# Patient Record
Sex: Male | Born: 1955 | Hispanic: Yes | Marital: Married | Smoking: Never smoker
Health system: Southern US, Community
[De-identification: ages and names within clinical notes are randomized; demographics above are authoritative.]

## PROBLEM LIST (undated history)

## (undated) DIAGNOSIS — I5042 Chronic combined systolic (congestive) and diastolic (congestive) heart failure: Secondary | ICD-10-CM

## (undated) DIAGNOSIS — Z7901 Long term (current) use of anticoagulants: Secondary | ICD-10-CM

## (undated) DIAGNOSIS — I255 Ischemic cardiomyopathy: Secondary | ICD-10-CM

## (undated) DIAGNOSIS — E785 Hyperlipidemia, unspecified: Secondary | ICD-10-CM

## (undated) DIAGNOSIS — I2510 Atherosclerotic heart disease of native coronary artery without angina pectoris: Secondary | ICD-10-CM

## (undated) DIAGNOSIS — I10 Essential (primary) hypertension: Secondary | ICD-10-CM

## (undated) DIAGNOSIS — E119 Type 2 diabetes mellitus without complications: Secondary | ICD-10-CM

## (undated) DIAGNOSIS — I2102 ST elevation (STEMI) myocardial infarction involving left anterior descending coronary artery: Secondary | ICD-10-CM

## (undated) HISTORY — DX: Chronic combined systolic (congestive) and diastolic (congestive) heart failure: I50.42

## (undated) HISTORY — DX: ST elevation (STEMI) myocardial infarction involving left anterior descending coronary artery: I21.02

## (undated) HISTORY — PX: APPENDECTOMY: SHX54

## (undated) HISTORY — DX: Atherosclerotic heart disease of native coronary artery without angina pectoris: I25.10

## (undated) HISTORY — PX: CARDIAC CATHETERIZATION: SHX172

## (undated) HISTORY — DX: Essential (primary) hypertension: I10

## (undated) HISTORY — DX: Type 2 diabetes mellitus without complications: E11.9

## (undated) HISTORY — DX: Ischemic cardiomyopathy: I25.5

## (undated) HISTORY — DX: Hyperlipidemia, unspecified: E78.5

## (undated) HISTORY — PX: CORONARY ANGIOPLASTY: SHX604

---

## 2017-01-25 ENCOUNTER — Emergency Department: Payer: Self-pay

## 2017-01-25 ENCOUNTER — Encounter: Payer: Self-pay | Admitting: Emergency Medicine

## 2017-01-25 ENCOUNTER — Inpatient Hospital Stay
Admission: EM | Admit: 2017-01-25 | Discharge: 2017-01-28 | DRG: 246 | Disposition: A | Discharge status: Home / Self Care | Payer: Self-pay | Attending: Internal Medicine | Admitting: Internal Medicine

## 2017-01-25 ENCOUNTER — Encounter
Admission: EM | Disposition: A | Discharge status: Home / Self Care | Payer: Self-pay | Source: Home / Self Care | Attending: Internal Medicine

## 2017-01-25 ENCOUNTER — Other Ambulatory Visit: Payer: Self-pay

## 2017-01-25 DIAGNOSIS — Z825 Family history of asthma and other chronic lower respiratory diseases: Secondary | ICD-10-CM

## 2017-01-25 DIAGNOSIS — I255 Ischemic cardiomyopathy: Secondary | ICD-10-CM | POA: Diagnosis present

## 2017-01-25 DIAGNOSIS — E1165 Type 2 diabetes mellitus with hyperglycemia: Secondary | ICD-10-CM | POA: Diagnosis present

## 2017-01-25 DIAGNOSIS — I5021 Acute systolic (congestive) heart failure: Secondary | ICD-10-CM | POA: Diagnosis present

## 2017-01-25 DIAGNOSIS — I2109 ST elevation (STEMI) myocardial infarction involving other coronary artery of anterior wall: Secondary | ICD-10-CM | POA: Diagnosis present

## 2017-01-25 DIAGNOSIS — I213 ST elevation (STEMI) myocardial infarction of unspecified site: Secondary | ICD-10-CM | POA: Diagnosis present

## 2017-01-25 DIAGNOSIS — Z8249 Family history of ischemic heart disease and other diseases of the circulatory system: Secondary | ICD-10-CM

## 2017-01-25 DIAGNOSIS — I2510 Atherosclerotic heart disease of native coronary artery without angina pectoris: Secondary | ICD-10-CM | POA: Diagnosis present

## 2017-01-25 DIAGNOSIS — I2102 ST elevation (STEMI) myocardial infarction involving left anterior descending coronary artery: Principal | ICD-10-CM | POA: Diagnosis present

## 2017-01-25 DIAGNOSIS — I110 Hypertensive heart disease with heart failure: Secondary | ICD-10-CM | POA: Diagnosis present

## 2017-01-25 DIAGNOSIS — E785 Hyperlipidemia, unspecified: Secondary | ICD-10-CM | POA: Diagnosis present

## 2017-01-25 DIAGNOSIS — E876 Hypokalemia: Secondary | ICD-10-CM | POA: Diagnosis not present

## 2017-01-25 HISTORY — PX: LEFT HEART CATH AND CORONARY ANGIOGRAPHY: CATH118249

## 2017-01-25 HISTORY — PX: CORONARY/GRAFT ACUTE MI REVASCULARIZATION: CATH118305

## 2017-01-25 LAB — COMPREHENSIVE METABOLIC PANEL
ALBUMIN: 4.3 g/dL (ref 3.5–5.0)
ALT: 49 U/L (ref 17–63)
AST: 230 U/L — AB (ref 15–41)
Alkaline Phosphatase: 63 U/L (ref 38–126)
Anion gap: 10 (ref 5–15)
BUN: 13 mg/dL (ref 6–20)
CHLORIDE: 104 mmol/L (ref 101–111)
CO2: 24 mmol/L (ref 22–32)
Calcium: 9.3 mg/dL (ref 8.9–10.3)
Creatinine, Ser: 0.77 mg/dL (ref 0.61–1.24)
GFR calc Af Amer: >60 mL/min (ref >60)
GFR calc non Af Amer: >60 mL/min (ref >60)
GLUCOSE: 196 mg/dL — AB (ref 65–99)
POTASSIUM: 3.6 mmol/L (ref 3.5–5.1)
SODIUM: 138 mmol/L (ref 135–145)
Total Bilirubin: 0.7 mg/dL (ref 0.3–1.2)
Total Protein: 7.4 g/dL (ref 6.5–8.1)

## 2017-01-25 LAB — CBC WITH DIFFERENTIAL/PLATELET
BASOS ABS: 0 10*3/uL (ref 0–0.1)
BASOS PCT: 0 %
Eosinophils Absolute: 0.1 10*3/uL (ref 0–0.7)
Eosinophils Relative: 1 %
HEMATOCRIT: 46.6 % (ref 40.0–52.0)
HEMOGLOBIN: 16.2 g/dL (ref 13.0–18.0)
LYMPHS PCT: 15 %
Lymphs Abs: 1.7 10*3/uL (ref 1.0–3.6)
MCH: 31.3 pg (ref 26.0–34.0)
MCHC: 34.6 g/dL (ref 32.0–36.0)
MCV: 90.4 fL (ref 80.0–100.0)
MONOS PCT: 8 %
Monocytes Absolute: 0.8 10*3/uL (ref 0.2–1.0)
NEUTROS ABS: 8.3 10*3/uL — AB (ref 1.4–6.5)
NEUTROS PCT: 76 %
Platelets: 215 10*3/uL (ref 150–440)
RBC: 5.16 MIL/uL (ref 4.40–5.90)
RDW: 13.1 % (ref 11.5–14.5)
WBC: 10.9 10*3/uL — ABNORMAL HIGH (ref 3.8–10.6)

## 2017-01-25 LAB — URINE DRUG SCREEN, QUALITATIVE (ARMC ONLY)
Amphetamines, Ur Screen: NOT DETECTED
BARBITURATES, UR SCREEN: NOT DETECTED
BENZODIAZEPINE, UR SCRN: POSITIVE — AB
Cannabinoid 50 Ng, Ur ~~LOC~~: NOT DETECTED
Cocaine Metabolite,Ur ~~LOC~~: NOT DETECTED
MDMA (Ecstasy)Ur Screen: NOT DETECTED
Methadone Scn, Ur: NOT DETECTED
OPIATE, UR SCREEN: NOT DETECTED
PHENCYCLIDINE (PCP) UR S: NOT DETECTED
Tricyclic, Ur Screen: NOT DETECTED

## 2017-01-25 LAB — TYPE AND SCREEN
ABO/RH(D): O POS
Antibody Screen: NEGATIVE

## 2017-01-25 LAB — GLUCOSE, CAPILLARY
GLUCOSE-CAPILLARY: 111 mg/dL — AB (ref 65–99)
Glucose-Capillary: 164 mg/dL — ABNORMAL HIGH (ref 65–99)

## 2017-01-25 LAB — TROPONIN I: TROPONIN I: 37.34 ng/mL — AB (ref <0.03)

## 2017-01-25 LAB — LIPID PANEL
CHOL/HDL RATIO: 4.2 ratio
Cholesterol: 184 mg/dL (ref 0–200)
HDL: 44 mg/dL (ref >40)
LDL CALC: 114 mg/dL — AB (ref 0–99)
Triglycerides: 132 mg/dL (ref <150)
VLDL: 26 mg/dL (ref 0–40)

## 2017-01-25 LAB — APTT: APTT: 33 s (ref 24–36)

## 2017-01-25 LAB — TSH: TSH: 2.06 u[IU]/mL (ref 0.350–4.500)

## 2017-01-25 LAB — PROTIME-INR
INR: 1.05
Prothrombin Time: 13.6 seconds (ref 11.4–15.2)

## 2017-01-25 LAB — POCT ACTIVATED CLOTTING TIME
ACTIVATED CLOTTING TIME: 241 s
ACTIVATED CLOTTING TIME: 290 s

## 2017-01-25 LAB — MRSA PCR SCREENING: MRSA BY PCR: NEGATIVE

## 2017-01-25 SURGERY — CORONARY/GRAFT ACUTE MI REVASCULARIZATION

## 2017-01-25 MED ORDER — SODIUM CHLORIDE 0.9 % IV SOLN: 250.0000 mL | INTRAVENOUS | Status: DC | PRN

## 2017-01-25 MED ORDER — FUROSEMIDE 10 MG/ML IJ SOLN
20.0000 mg | Freq: Once | INTRAMUSCULAR | Status: AC
  Administered 2017-01-25: 20 mg via INTRAVENOUS
  Filled 2017-01-25: qty 2

## 2017-01-25 MED ORDER — VERAPAMIL HCL 2.5 MG/ML IV SOLN
INTRAVENOUS | Status: AC
  Filled 2017-01-25: qty 2

## 2017-01-25 MED ORDER — HEPARIN SODIUM (PORCINE) 5000 UNIT/ML IJ SOLN
60.0000 [IU]/kg | Freq: Once | INTRAMUSCULAR | Status: DC
  Administered 2017-01-25: 4080 [IU] via INTRAVENOUS

## 2017-01-25 MED ORDER — NITROGLYCERIN 1 MG/10 ML FOR IR/CATH LAB
INTRA_ARTERIAL | Status: DC | PRN
  Administered 2017-01-25 (×2): 100 ug via INTRACORONARY

## 2017-01-25 MED ORDER — OXYCODONE HCL 5 MG PO TABS: 5.0000 mg | ORAL_TABLET | ORAL | Status: DC | PRN

## 2017-01-25 MED ORDER — ONDANSETRON HCL 4 MG/2ML IJ SOLN: 4.0000 mg | Freq: Four times a day (QID) | INTRAMUSCULAR | Status: DC | PRN

## 2017-01-25 MED ORDER — ASPIRIN 81 MG PO CHEW
324.0000 mg | CHEWABLE_TABLET | Freq: Once | ORAL | Status: AC
  Administered 2017-01-25: 324 mg via ORAL

## 2017-01-25 MED ORDER — POTASSIUM CHLORIDE CRYS ER 20 MEQ PO TBCR
40.0000 meq | EXTENDED_RELEASE_TABLET | Freq: Once | ORAL | Status: AC
  Administered 2017-01-25: 40 meq via ORAL
  Filled 2017-01-25: qty 2

## 2017-01-25 MED ORDER — IOPAMIDOL (ISOVUE-370) INJECTION 76%
100.0000 mL | Freq: Once | INTRAVENOUS | Status: AC | PRN
  Administered 2017-01-25: 100 mL via INTRAVENOUS

## 2017-01-25 MED ORDER — NITROGLYCERIN 5 MG/ML IV SOLN
INTRAVENOUS | Status: AC
  Filled 2017-01-25: qty 10

## 2017-01-25 MED ORDER — TIROFIBAN HCL IN NACL 5-0.9 MG/100ML-% IV SOLN: INTRAVENOUS | Status: DC | PRN

## 2017-01-25 MED ORDER — SODIUM CHLORIDE 0.9% FLUSH
3.0000 mL | Freq: Two times a day (BID) | INTRAVENOUS | Status: DC
  Administered 2017-01-25 – 2017-01-28 (×6): 3 mL via INTRAVENOUS

## 2017-01-25 MED ORDER — ATORVASTATIN CALCIUM 20 MG PO TABS
80.0000 mg | ORAL_TABLET | Freq: Every day | ORAL | Status: DC
  Administered 2017-01-25 – 2017-01-27 (×3): 80 mg via ORAL
  Filled 2017-01-25 (×3): qty 4

## 2017-01-25 MED ORDER — LIDOCAINE HCL (PF) 1 % IJ SOLN
INTRAMUSCULAR | Status: AC
  Filled 2017-01-25: qty 30

## 2017-01-25 MED ORDER — ACETAMINOPHEN 325 MG PO TABS
650.0000 mg | ORAL_TABLET | ORAL | Status: DC | PRN
  Administered 2017-01-25: 650 mg via ORAL
  Filled 2017-01-25: qty 2

## 2017-01-25 MED ORDER — INSULIN ASPART 100 UNIT/ML ~~LOC~~ SOLN
0.0000 [IU] | Freq: Three times a day (TID) | SUBCUTANEOUS | Status: DC
  Administered 2017-01-26: 2 [IU] via SUBCUTANEOUS
  Administered 2017-01-26 – 2017-01-27 (×2): 1 [IU] via SUBCUTANEOUS
  Administered 2017-01-27: 3 [IU] via SUBCUTANEOUS
  Filled 2017-01-25 (×4): qty 1

## 2017-01-25 MED ORDER — INSULIN ASPART 100 UNIT/ML ~~LOC~~ SOLN: 0.0000 [IU] | Freq: Every day | SUBCUTANEOUS | Status: DC

## 2017-01-25 MED ORDER — TIROFIBAN HCL IV 12.5 MG/250 ML
INTRAVENOUS | Status: DC | PRN
  Administered 2017-01-25: 0.15 ug/kg/min via INTRAVENOUS

## 2017-01-25 MED ORDER — HEPARIN SODIUM (PORCINE) 1000 UNIT/ML IJ SOLN
INTRAMUSCULAR | Status: AC
  Filled 2017-01-25: qty 1

## 2017-01-25 MED ORDER — IOPAMIDOL (ISOVUE-300) INJECTION 61%
INTRAVENOUS | Status: DC | PRN
  Administered 2017-01-25: 175 mL via INTRA_ARTERIAL

## 2017-01-25 MED ORDER — CARVEDILOL 3.125 MG PO TABS
3.1250 mg | ORAL_TABLET | Freq: Two times a day (BID) | ORAL | Status: DC
  Administered 2017-01-25 – 2017-01-28 (×5): 3.125 mg via ORAL
  Filled 2017-01-25 (×6): qty 1

## 2017-01-25 MED ORDER — TICAGRELOR 90 MG PO TABS
ORAL_TABLET | ORAL | Status: DC | PRN
  Administered 2017-01-25: 180 mg via ORAL

## 2017-01-25 MED ORDER — SODIUM CHLORIDE 0.9% FLUSH
3.0000 mL | INTRAVENOUS | Status: DC | PRN
  Administered 2017-01-26: 3 mL via INTRAVENOUS
  Filled 2017-01-25: qty 3

## 2017-01-25 MED ORDER — TIROFIBAN (AGGRASTAT) BOLUS VIA INFUSION
INTRAVENOUS | Status: DC | PRN
  Administered 2017-01-25: 1700 ug via INTRAVENOUS

## 2017-01-25 MED ORDER — HEPARIN (PORCINE) IN NACL 2-0.9 UNIT/ML-% IJ SOLN
INTRAMUSCULAR | Status: AC
  Filled 2017-01-25: qty 1000

## 2017-01-25 MED ORDER — HYDRALAZINE HCL 20 MG/ML IJ SOLN: 5.0000 mg | INTRAMUSCULAR | Status: DC | PRN

## 2017-01-25 MED ORDER — MIDAZOLAM HCL 2 MG/2ML IJ SOLN
INTRAMUSCULAR | Status: AC
  Filled 2017-01-25: qty 2

## 2017-01-25 MED ORDER — FENTANYL CITRATE (PF) 100 MCG/2ML IJ SOLN
INTRAMUSCULAR | Status: AC
  Filled 2017-01-25: qty 2

## 2017-01-25 MED ORDER — TICAGRELOR 90 MG PO TABS
ORAL_TABLET | ORAL | Status: AC
  Filled 2017-01-25: qty 2

## 2017-01-25 MED ORDER — HEPARIN (PORCINE) IN NACL 100-0.45 UNIT/ML-% IJ SOLN
800.0000 [IU]/h | INTRAMUSCULAR | Status: DC
  Administered 2017-01-25: 800 [IU]/h via INTRAVENOUS
  Filled 2017-01-25: qty 250

## 2017-01-25 MED ORDER — FENTANYL CITRATE (PF) 100 MCG/2ML IJ SOLN
INTRAMUSCULAR | Status: DC | PRN
  Administered 2017-01-25 (×3): 25 ug via INTRAVENOUS

## 2017-01-25 MED ORDER — TICAGRELOR 90 MG PO TABS
90.0000 mg | ORAL_TABLET | Freq: Two times a day (BID) | ORAL | Status: DC
  Administered 2017-01-26 – 2017-01-28 (×5): 90 mg via ORAL
  Filled 2017-01-25 (×6): qty 1

## 2017-01-25 MED ORDER — ASPIRIN 81 MG PO CHEW
81.0000 mg | CHEWABLE_TABLET | Freq: Every day | ORAL | Status: DC
  Administered 2017-01-26 – 2017-01-28 (×3): 81 mg via ORAL
  Filled 2017-01-25 (×3): qty 1

## 2017-01-25 MED ORDER — HEPARIN BOLUS VIA INFUSION
4000.0000 [IU] | Freq: Once | INTRAVENOUS | Status: DC
  Filled 2017-01-25: qty 4000

## 2017-01-25 MED ORDER — ENOXAPARIN SODIUM 40 MG/0.4ML ~~LOC~~ SOLN
40.0000 mg | SUBCUTANEOUS | Status: DC
  Administered 2017-01-26 – 2017-01-28 (×3): 40 mg via SUBCUTANEOUS
  Filled 2017-01-25 (×4): qty 0.4

## 2017-01-25 MED ORDER — MIDAZOLAM HCL 2 MG/2ML IJ SOLN
INTRAMUSCULAR | Status: DC | PRN
  Administered 2017-01-25 (×2): 0.5 mg via INTRAVENOUS

## 2017-01-25 MED ORDER — VERAPAMIL HCL 2.5 MG/ML IV SOLN
INTRAVENOUS | Status: DC | PRN
  Administered 2017-01-25: 2.5 mg via INTRA_ARTERIAL

## 2017-01-25 MED ORDER — HEPARIN SODIUM (PORCINE) 1000 UNIT/ML IJ SOLN
INTRAMUSCULAR | Status: DC | PRN
  Administered 2017-01-25: 4000 [IU] via INTRAVENOUS
  Administered 2017-01-25: 2000 [IU] via INTRAVENOUS

## 2017-01-25 MED ORDER — SODIUM CHLORIDE 0.9 % IV SOLN: INTRAVENOUS | Status: DC

## 2017-01-25 SURGICAL SUPPLY — 16 items
BALLN TREK RX 2.25X12 (BALLOONS) ×3
BALLN ~~LOC~~ EUPHORA RX 3.5X20 (BALLOONS) ×3
BALLOON TREK RX 2.25X12 (BALLOONS) ×1 IMPLANT
BALLOON ~~LOC~~ EUPHORA RX 3.5X20 (BALLOONS) ×1 IMPLANT
CATH 5FR JR4 DIAGNOSTIC (CATHETERS) ×3 IMPLANT
CATH INFINITI 5FR AL1 (CATHETERS) ×3 IMPLANT
CATH LAUNCHER 6FR EBU3.5 (CATHETERS) ×3 IMPLANT
DEVICE INFLAT 30 PLUS (MISCELLANEOUS) ×3 IMPLANT
DEVICE RAD COMP TR BAND LRG (VASCULAR PRODUCTS) ×3 IMPLANT
GLIDESHEATH SLEND SS 6F .021 (SHEATH) ×3 IMPLANT
KIT MANI 3VAL PERCEP (MISCELLANEOUS) ×3 IMPLANT
PACK CARDIAC CATH (CUSTOM PROCEDURE TRAY) ×3 IMPLANT
STENT SIERRA 3.00 X 23 MM (Permanent Stent) ×3 IMPLANT
WIRE HITORQ VERSACORE ST 145CM (WIRE) ×3 IMPLANT
WIRE ROSEN-J .035X260CM (WIRE) ×3 IMPLANT
WIRE RUNTHROUGH .014X180CM (WIRE) ×3 IMPLANT

## 2017-01-25 NOTE — Progress Notes (Signed)
ANTICOAGULATION CONSULT NOTE - Initial Consult  Pharmacy Consult for Heparin  Indication: chest pain/ACS  Allergies no known allergies  Patient Measurements: Height: 5\' 1"  (154.9 cm) Weight: 150 lb (68 kg) IBW/kg (Calculated) : 52.3 Heparin Dosing Weight: 66.2 kg   Vital Signs: Temp: 98.4 F (36.9 C) (11/25 1725) Temp Source: Oral (11/25 1725) BP: 146/108 (11/25 1725) Pulse Rate: 92 (11/25 1725)  Labs: No results for input(s): HGB, HCT, PLT, APTT, LABPROT, INR, HEPARINUNFRC, HEPRLOWMOCWT, CREATININE, CKTOTAL, CKMB, TROPONINI in the last 72 hours.  CrCl cannot be calculated (No order found.).   Medical History: History reviewed. No pertinent past medical history.  Medications:   (Not in a hospital admission)  Assessment: Pharmacy consulted to dose heparin for ACS/NSTEMI in this 50 male.  No prior anticoag noted.  CrCl = ?   Goal of Therapy:  Heparin level 0.3-0.7 units/ml Monitor platelets by anticoagulation protocol: Yes   Plan:  Give 4000 units bolus x 1 Start heparin infusion at 800 units/hr Check anti-Xa level in 6 hours and daily while on heparin Continue to monitor H&H and platelets  Lucas [name] D 01/25/2017,5:30 PM

## 2017-01-25 NOTE — Progress Notes (Signed)
Chaplain provided Spiritual Care during STEMI Code. Chaplain provided family (wife, son - speaks English well) support during code. Prayer offered with interpretors support (Catholic background) to [employer] (Spanish first language). Chaplain supported family during education, decision making, and transition to waiting area. Family shared Lucas [name] has had health decline (recent left job due to health concerns). Chaplain worked with case Freight forwarder to provide family with materials related to financial aid and community resources.

## 2017-01-25 NOTE — ED Provider Notes (Addendum)
[facility] Emergency Department Provider Note       Time seen: ----------------------------------------- 5:29 PM on 01/25/2017 -----------------------------------------   I have reviewed the triage vital signs and the nursing notes.  HISTORY   Chief Complaint Chest Pain    HPI Lucas [name] is a 61 y.o. male with a known past medical history who presents to the ED for chest pain intermittently for the last month but worsening and severe with persistence over the last 4 days.  Patient states last week he had pain from his chest into his back and currently his worst pain is in his back.  He describes 10 out of 10 pain, nothing makes it better.  He is dyspneic on exertion and had diaphoresis.  History reviewed. No pertinent past medical history.  There are no active problems to display for this patient.   History reviewed. No pertinent surgical history.  Allergies Patient has no known allergies.  Social History Social History   Tobacco Use  . Smoking status: Not on file  Substance Use Topics  . Alcohol use: No    Frequency: Never  . Drug use: No    Review of Systems Constitutional: Negative for fever. Eyes: Negative for vision changes ENT:  Negative for congestion, sore throat Cardiovascular: Positive for chest pain Respiratory: Positive for shortness of breath Gastrointestinal: Negative for abdominal pain, vomiting and diarrhea. Musculoskeletal: Positive for back pain Skin: Positive for diaphoresis Neurological: Negative for headaches, focal weakness or numbness.  All systems negative/normal/unremarkable except as stated in the HPI  ____________________________________________   PHYSICAL EXAM:  VITAL SIGNS: ED Triage Vitals  Enc Vitals Group     BP 01/25/17 1725 (!) 146/108     Pulse Rate 01/25/17 1725 92     Resp 01/25/17 1725 20     Temp 01/25/17 1725 98.4 F (36.9 C)     Temp Source 01/25/17 1725 Oral     SpO2  01/25/17 1725 96 %     Weight 01/25/17 1725 150 lb (68 kg)     Height 01/25/17 1725 5\' 1"  (1.549 m)     Head Circumference --      Peak Flow --      Pain Score 01/25/17 1724 10     Pain Loc --      Pain Edu? --      Excl. in [place]? --     Constitutional: Alert and oriented. Mild distress Eyes: Conjunctivae are normal. Normal extraocular movements. ENT   Head: Normocephalic and atraumatic.   Nose: No congestion/rhinnorhea.   Mouth/Throat: Mucous membranes are moist.   Neck: No stridor. Cardiovascular: Normal rate, regular rhythm. No murmurs, rubs, or gallops. Respiratory: Normal respiratory effort without tachypnea nor retractions. Breath sounds are clear and equal bilaterally. No wheezes/rales/rhonchi. Gastrointestinal: Soft and nontender. Normal bowel sounds Musculoskeletal: Nontender with normal range of motion in extremities. No lower extremity tenderness nor edema. Neurologic:  Normal speech and language. No gross focal neurologic deficits are appreciated.  Skin:  Skin is warm,  Mild diaphoresis is noted Psychiatric: Mood and affect are normal. Speech and behavior are normal.  ____________________________________________  EKG: Interpreted by me.  Sinus rhythm with sinus arrhythmia, rate is 81 bpm, ST elevation in the anteroseptal leads, T wave inversions in 1 and aVL.  Left axis deviation.  EKG meets STEMI criteria  ____________________________________________  ED COURSE:  Pertinent labs & imaging results that were available during my care of the patient were reviewed by me and considered in my  medical decision making (see chart for details). Patient presents for chest pain, we will assess with labs and imaging as indicated.   Procedures ____________________________________________   LABS (pertinent positives/negatives)  Labs Reviewed  CBC WITH DIFFERENTIAL/PLATELET - Abnormal; Notable for the following components:      Result Value   WBC 10.9 (*)    Neutro  Abs 8.3 (*)    All other components within normal limits  PROTIME-INR  APTT  COMPREHENSIVE METABOLIC PANEL  TROPONIN I  LIPID PANEL  HEPARIN LEVEL (UNFRACTIONATED)  TYPE AND SCREEN   CRITICAL CARE Performed by: Earleen Newport   Total critical care time: 30 minutes  Critical care time was exclusive of separately billable procedures and treating other patients.  Critical care was necessary to treat or prevent imminent or life-threatening deterioration.  Critical care was time spent personally by me on the following activities: development of treatment plan with patient and/or surrogate as well as nursing, discussions with consultants, evaluation of patient's response to treatment, examination of patient, obtaining history from patient or surrogate, ordering and performing treatments and interventions, ordering and review of laboratory studies, ordering and review of radiographic studies, pulse oximetry and re-evaluation of patient's condition.  RADIOLOGY Images were viewed by me  CT dissection protocol of the chest/abdomen and pelvis Is negative for dissection ____________________________________________  DIFFERENTIAL DIAGNOSIS   Dissection, STEMI, unstable angina, musculoskeletal pain  FINAL ASSESSMENT AND PLAN  STEMI   Plan: Patient had presented for chest pain that radiates into his back that is been persistent for the last 4 days. Patient's labs are still pending at this time. Patient's imaging was reassuring for not showing a dissection.  Patient needs emergent cardiac catheterization for likely stenting of the coronary artery involved.  Dr. Saunders Revel from cardiology has been involved with his care.   Earleen Newport, MD   Note: This note was generated in part or whole with voice recognition software. Voice recognition is usually quite accurate but there are transcription errors that can and very often do occur. I apologize for any typographical errors that were  not detected and corrected.     Earleen Newport, MD 01/25/17 1754    Earleen Newport, MD 01/25/17 [phone]

## 2017-01-25 NOTE — ED Notes (Signed)
Pt taken to cath lab at this time .

## 2017-01-25 NOTE — ED Notes (Signed)
Cardiologist at bedside.  

## 2017-01-25 NOTE — ED Triage Notes (Signed)
Pt reports that he has had chest pain for a month,  the last week he has had pain from his back that radiates into his chest.

## 2017-01-25 NOTE — Consult Note (Signed)
Cardiology Consultation Note    Patient ID: Lucas [name], MRN: [id number], DOB/AGE: [birth year] 61 y.o. Admit date: 01/25/2017   Date of Consult: 01/25/2017 Primary Physician: No primary care provider on file. Primary Cardiologist: New  Chief Complaint: Chest pain Reason for Consultation: STEMI Requesting MD: Lenise Arena, MD  HPI: Lucas [name] is a 61 y.o. male with no significant past medical history, who presents with chest pain that began 3 days ago. History is obtained with the assistance of a Spanish interpreter. He noticed severe substernal and right shoulder pain, that has been continuous. He also notes intermittent nausea and diaphoresis. He did not tell anyone about the pain until yesterday evening. While out shopping with his family today, he was unable to get out of the car due to severe chest pain. He presented to the [facility] ED, were EKG showed anterior ST elevation with anteroseptal Q-waves. The patient received ASA 324 mg and heparin 4,080 units IV x 1. CTA of the chest, abdomen, and pelvis was performed out of concern for aortic dissection, given severe left shoulder pain predominantly around the scapula. Preliminary review of the CTA showed no evidence of dissection.  At this time, he continues to have 9/10 chest and right shoulder/back pain.  History reviewed. No pertinent past medical history.    Surgical History: History reviewed. No pertinent surgical history.   Home Meds: Prior to Admission medications   Not on File    Inpatient Medications:  . [MAR Hold] heparin  4,000 Units Intravenous Once   . sodium chloride    . heparin 800 Units/hr (01/25/17 1808)    Allergies: No Known Allergies  Social History   Socioeconomic History  . Marital status: Married    Spouse name: Not on file  . Number of children: Not on file  . Years of education: Not on file  . Highest education level: Not on file  Social Needs  . Financial resource strain: Not  on file  . Food insecurity - worry: Not on file  . Food insecurity - inability: Not on file  . Transportation needs - medical: Not on file  . Transportation needs - non-medical: Not on file  Occupational History  . Not on file  Tobacco Use  . Smoking status: Never Smoker  . Smokeless tobacco: Never Used  Substance and Sexual Activity  . Alcohol use: No    Frequency: Never  . Drug use: No  . Sexual activity: Yes    Birth control/protection: Coitus interruptus  Other Topics Concern  . Not on file  Social History Narrative  . Not on file     Family History  Problem Relation Age of Onset  . Heart disease Father        s/p pacemaker     Review of Systems: Unable to be performed due to acuity of illness.  Labs: Recent Labs    01/25/17 1730  TROPONINI 37.34*   Lab Results  Component Value Date   WBC 10.9 (H) 01/25/2017   HGB 16.2 01/25/2017   HCT 46.6 01/25/2017   MCV 90.4 01/25/2017   PLT 215 01/25/2017    Recent Labs  Lab 01/25/17 1730  NA 138  K 3.6  CL 104  CO2 24  BUN 13  CREATININE 0.77  CALCIUM 9.3  PROT 7.4  BILITOT 0.7  ALKPHOS 63  ALT 49  AST 230*  GLUCOSE 196*   Lab Results  Component Value Date   CHOL 184 01/25/2017   HDL 44  01/25/2017   LDLCALC 114 (H) 01/25/2017   TRIG 132 01/25/2017   No results found for: DDIMER  Radiology/Studies:  Dg Chest Port 1 View  Result Date: 01/25/2017 CLINICAL DATA:  Patient with chest pain for 1 month. EXAM: PORTABLE CHEST 1 VIEW COMPARISON:  None. FINDINGS: Pacing pad overlies the left hemithorax. Normal cardiac and mediastinal contours. No consolidative pulmonary opacities. No pleural effusion or pneumothorax. IMPRESSION: No acute cardiopulmonary process. Electronically Signed   By: Lovey Newcomer M.D.   On: 01/25/2017 18:14    Wt Readings from Last 3 Encounters:  01/25/17 150 lb (68 kg)    EKG: NSR with PACs, anterior ST elevation and anteroseptal Q-waves.  Physical Exam: Blood pressure (!)  139/94, pulse 90, temperature 98.4 F (36.9 C), temperature source Oral, resp. rate 18, height 5\' 1"  (1.549 m), weight 150 lb (68 kg), SpO2 97 %. Body mass index is 28.34 kg/m. General: Well developed, well nourished, in no acute distress. Head: Normocephalic, atraumatic, sclera non-icteric, no xanthomas, nares are without discharge.  Neck: Negative for carotid bruits. JVD not elevated. Lungs: Clear bilaterally to auscultation without wheezes, rales, or rhonchi. Breathing is unlabored. Heart: RRR with S1 S2. No murmurs, rubs, or gallops appreciated. Abdomen: Soft, non-tender, non-distended with normoactive bowel sounds. No hepatomegaly. No rebound/guarding. No obvious abdominal masses. Msk:  Strength and tone appear normal for age. Extremities: No clubbing or cyanosis. No edema.  Distal pedal pulses are 2+ and equal bilaterally. Neuro: Alert and oriented X 3. No facial asymmetry. No focal deficit. Moves all extremities spontaneously. Psych:  Responds to questions appropriately with a normal affect.    Assessment and Plan  Anterior STEMI EKG findings are consistent with late-presenting anterior STEMI. I suspect his initial event began when symptoms developed 3 days ago. Initial troponin is already significant elevated. However, given continued severe chest pain, I am concerned that some potentially viable myocardium remains ischemic and may benefit from revascularization. I have reviewed the risks, indications, and alternatives to cardiac catheterization, possible angioplasty, and stenting with the patient. Risks include but are not limited to bleeding, infection, vascular injury, stroke, myocardial infection, arrhythmia, kidney injury, radiation-related injury in the case of prolonged fluoroscopy use, emergency cardiac surgery, and death. The patient understands the risks of serious complication is 1-2 in 2637 with diagnostic cardiac cath and 1-2% or less with angioplasty/stenting. The patient was  initially hesitant to proceed with the procedure. A lengthy discussion ensued with [name] and his family; he ultimately agreed to proceed with emergent LHC and possible PCI.  Dispo: Transfer to ICU following catheterization. Hospitalist service to admit; we will continue to follow.  Verlan Friends Odis Wickey MD 01/25/2017, 6:31 PM Pager: [phone]

## 2017-01-25 NOTE — Progress Notes (Signed)
[place] Progress Note Patient Name: Lucas [name] DOB: [birth year] MRN: [id number]   Date of Service  01/25/2017  HPI/Events of Note  Acute coronary syndrome- DES to LAD CT angio neg  eICU Interventions  monitor     Intervention Category Evaluation Type: New Patient Evaluation  Lucas [name] V. Lawana Hartzell 01/25/2017, 9:15 PM

## 2017-01-25 NOTE — ED Notes (Signed)
Family and interpreter at bedside with cardiologist discussing cardiac catheterization.

## 2017-01-25 NOTE — H&P (Signed)
[place] [facility] [place] NAME: Lucas [name]    MR#:  [id number]  DATE OF BIRTH:  [birth year]  DATE OF ADMISSION:  01/25/2017  PRIMARY CARE PHYSICIAN: No primary care provider on file.   REQUESTING/REFERRING PHYSICIAN: Dr Harrell Gave End  CHIEF COMPLAINT:   Chief Complaint  Patient presents with  . Chest Pain    HISTORY OF PRESENT ILLNESS:  Lucas [name]  is a 61 y.o. male no medical problems presented with chest pain.  History obtained through interpreter.  He states today he was working today and he was lifting up boxes stacking them up over his head and he developed pain in his arms.  He went to eat.  His entire back and chest was hurting.  Then he developed some hurting in his head.  It felt like he was going to fall and he was very weak.  Pain described as 10 out of 10 in intensity.  Difficult to ascertain the quality of the pain through the interpreter.  No complaints of shortness of breath, nausea or diaphoresis.  The patient states that he had the pain all day yesterday and he thought it would go away.  The patient was brought to the cardiac Cath Lab for a STEMI and had a stent in the LAD.  He states his back pain is a little less than 8 out of 10 in intensity and his chest pain is down to 4 out of 10 intensity.  Hospitalist services were contacted for admission.  PAST MEDICAL HISTORY:  History reviewed. No pertinent past medical history.  PAST SURGICAL HISTORY:   Past Surgical History:  Procedure Laterality Date  . APPENDECTOMY      SOCIAL HISTORY:   Social History   Tobacco Use  . Smoking status: Never Smoker  . Smokeless tobacco: Never Used  Substance Use Topics  . Alcohol use: No    Frequency: Never    FAMILY HISTORY:   Family History  Problem Relation Age of Onset  . Heart disease Father        s/p pacemaker  . Arrhythmia Father   . Asthma Father   . Asthma Mother     DRUG ALLERGIES:  No  Known Allergies  REVIEW OF SYSTEMS:  CONSTITUTIONAL: No fever, chills or sweats.  Positive for fatigue.  EYES: No blurred or double vision.  EARS, NOSE, AND THROAT: No tinnitus or ear pain.  Positive for sore throat. RESPIRATORY: Occasional cough.  No shortness of breath, wheezing or hemoptysis.  CARDIOVASCULAR: Positive for chest pain, no orthopnea, edema.  GASTROINTESTINAL: No nausea, vomiting, diarrhea or abdominal pain. No blood in bowel movements GENITOURINARY: No dysuria, hematuria.  ENDOCRINE: No polyuria, nocturia,  HEMATOLOGY: No anemia, easy bruising or bleeding SKIN: No rash or lesion. MUSCULOSKELETAL: Positive for back pain.  Some muscle pain NEUROLOGIC: No tingling, numbness, weakness.  PSYCHIATRY: No anxiety or depression.   MEDICATIONS AT HOME:   Prior to Admission medications   Not on File   Patient does not take any medication  VITAL SIGNS:  Blood pressure (!) 139/94, pulse 90, temperature 98 F (36.7 C), temperature source Oral, resp. rate 18, height 5\' 8"  (1.727 m), weight 66.6 kg (146 lb 13.2 oz), SpO2 97 %.  PHYSICAL EXAMINATION:  GENERAL:  61 y.o.-year-old patient lying in the bed with no acute distress.  EYES: Pupils equal, round, reactive to light and accommodation. No scleral icterus. Extraocular muscles intact.  HEENT: Head atraumatic, normocephalic. Oropharynx and nasopharynx clear.  NECK:  Supple, no jugular venous distention. No thyroid enlargement, no tenderness.  LUNGS: Normal breath sounds bilaterally, no wheezing, rales,rhonchi or crepitation. No use of accessory muscles of respiration.  CARDIOVASCULAR: S1, S2 normal. No murmurs, rubs, or gallops.  ABDOMEN: Soft, nontender, nondistended. Bowel sounds present. No organomegaly or mass.  EXTREMITIES: No pedal edema, cyanosis, or clubbing.  NEUROLOGIC: Cranial nerves II through XII are intact. Muscle strength 5/5 in all extremities. Sensation intact. Gait not checked.  PSYCHIATRIC: The patient is  alert and oriented x 3.  SKIN: No rash, lesion, or ulcer.   LABORATORY PANEL:   CBC Recent Labs  Lab 01/25/17 1730  WBC 10.9*  HGB 16.2  HCT 46.6  PLT 215   ------------------------------------------------------------------------------------------------------------------  Chemistries  Recent Labs  Lab 01/25/17 1730  NA 138  K 3.6  CL 104  CO2 24  GLUCOSE 196*  BUN 13  CREATININE 0.77  CALCIUM 9.3  AST 230*  ALT 49  ALKPHOS 63  BILITOT 0.7   ------------------------------------------------------------------------------------------------------------------  Cardiac Enzymes Recent Labs  Lab 01/25/17 1730  TROPONINI 37.34*   ------------------------------------------------------------------------------------------------------------------  RADIOLOGY:  Dg Chest Port 1 View  Result Date: 01/25/2017 CLINICAL DATA:  Patient with chest pain for 1 month. EXAM: PORTABLE CHEST 1 VIEW COMPARISON:  None. FINDINGS: Pacing pad overlies the left hemithorax. Normal cardiac and mediastinal contours. No consolidative pulmonary opacities. No pleural effusion or pneumothorax. IMPRESSION: No acute cardiopulmonary process. Electronically Signed   By: Lovey Newcomer M.D.   On: 01/25/2017 18:14   Ct Angio Chest/abd/pel For Dissection W And/or Wo Contrast  Result Date: 01/25/2017 CLINICAL DATA:  Chest pain for 1 month EXAM: CT ANGIOGRAPHY CHEST, ABDOMEN AND PELVIS TECHNIQUE: Multidetector CT imaging through the chest, abdomen and pelvis was performed using the standard protocol during bolus administration of intravenous contrast. Multiplanar reconstructed images and MIPs were obtained and reviewed to evaluate the vascular anatomy. CONTRAST:  124mL ISOVUE-370 IOPAMIDOL (ISOVUE-370) INJECTION 76% COMPARISON:  01/25/2017 FINDINGS: CTA CHEST FINDINGS Cardiovascular: Non contrasted images of the chest demonstrate no intramural hematoma. nonaneurysmal aorta. No dissection is seen. Mild coronary artery  calcification. Minimal atherosclerotic calcifications of the aorta. Normal heart size. Trace pericardial fluid. Mediastinum/Nodes: Multiple calcified lymph nodes consistent with prior granulomatous disease. Midline trachea. No thyroid mass. Esophagus within normal limits. Lungs/Pleura: Calcified right upper lobe pulmonary nodule, likely a granuloma given calcified nodes. No pleural effusion or consolidation. Negative for a pneumothorax Musculoskeletal: No chest wall abnormality. No acute or significant osseous findings. Review of the MIP images confirms the above findings. CTA ABDOMEN AND PELVIS FINDINGS VASCULAR Aorta: Normal caliber aorta without aneurysm, dissection, vasculitis or significant stenosis. Celiac: Patent without evidence of aneurysm, dissection, vasculitis or significant stenosis. SMA: Patent without evidence of aneurysm, dissection, vasculitis or significant stenosis. Renals: Both renal arteries are patent without evidence of aneurysm, dissection, vasculitis, fibromuscular dysplasia or significant stenosis. IMA: Patent without evidence of aneurysm, dissection, vasculitis or significant stenosis. Inflow: Patent without evidence of aneurysm, dissection, vasculitis or significant stenosis. Veins: No obvious venous abnormality within the limitations of this arterial phase study. Review of the MIP images confirms the above findings. NON-VASCULAR Hepatobiliary: No focal liver abnormality is seen. No gallstones, gallbladder wall thickening, or biliary dilatation. Pancreas: Unremarkable. No pancreatic ductal dilatation or surrounding inflammatory changes. Spleen: Heterogeneous, likely due to arterial phase enhancement Adrenals/Urinary Tract: Adrenal glands are unremarkable. Kidneys are normal, without renal calculi, focal lesion, or hydronephrosis. Mild asymmetric thickening of the right anterior bladder wall. Stomach/Bowel: Stomach is within normal limits.  Appendix not well seen. Diffuse diverticular  disease of the colon without acute inflammation No evidence of bowel wall thickening, distention, or inflammatory changes. Lymphatic: No significantly enlarged lymph nodes Reproductive: Prostate gland is enlarged. Other: Negative for free air or free fluid. Right greater than left fat containing inguinal hernia. Musculoskeletal: No acute or significant osseous findings. Review of the MIP images confirms the above findings. IMPRESSION: 1. Negative for acute aortic dissection or aneurysm 2. Clear lung fields.  Evidence of prior granulomatous disease 3. Minimal atherosclerotic calcification and coronary calcification. 4. No CT evidence for acute intra-abdominal or pelvic abnormality 5. Diffuse diverticular disease of the colon without acute inflammation 6. Mild asymmetric wall thickening of the right anterior bladder, correlate clinically for any urinary tract symptoms. Electronically Signed   By: Donavan Foil M.D.   On: 01/25/2017 18:45    EKG:   Acute STEMI with ST elevations V2 through V5.  Left axis deviation, left atrial enlargement  IMPRESSION AND PLAN:   1.  STEMI.  Stents placed in the LAD by cardiology Dr. Saunders Revel.  Aspirin, Brilinta, Coreg and Lipitor prescribed.  Troponin elevated at 37.34 2.  Hyperlipidemia unspecified.  LDL 114 and goal less than 70.  Lipitor prescribed. 3.  Impaired fasting glucose.  Check a hemoglobin A1c and put on sliding scale for now. 4.  Essential hypertension.  Coreg prescribed.  All the records are reviewed and case discussed with ED provider. Management plans discussed with the patient, family and they are in agreement.  CODE STATUS: Full code  TOTAL TIME TAKING CARE OF THIS PATIENT: 50 minutes.    Loletha Grayer M.D on 01/25/2017 at 9:38 PM  Between 7am to 6pm - Pager - [phone]  After 6pm call admission pager [phone]  [facility] Office  [phone]  CC: Primary care physician; No primary care provider on file.

## 2017-01-26 ENCOUNTER — Encounter: Payer: Self-pay | Admitting: Internal Medicine

## 2017-01-26 ENCOUNTER — Inpatient Hospital Stay (HOSPITAL_COMMUNITY)
Admit: 2017-01-26 | Discharge: 2017-01-26 | Disposition: A | Discharge status: Home / Self Care | Payer: Self-pay | Attending: Internal Medicine | Admitting: Internal Medicine

## 2017-01-26 ENCOUNTER — Other Ambulatory Visit: Payer: Self-pay

## 2017-01-26 DIAGNOSIS — I5030 Unspecified diastolic (congestive) heart failure: Secondary | ICD-10-CM

## 2017-01-26 LAB — TROPONIN I: Troponin I: 46.55 ng/mL (ref <0.03)

## 2017-01-26 LAB — BASIC METABOLIC PANEL
ANION GAP: 9 (ref 5–15)
BUN: 13 mg/dL (ref 6–20)
CALCIUM: 8.8 mg/dL — AB (ref 8.9–10.3)
CO2: 23 mmol/L (ref 22–32)
CREATININE: 0.78 mg/dL (ref 0.61–1.24)
Chloride: 107 mmol/L (ref 101–111)
GFR calc non Af Amer: >60 mL/min (ref >60)
Glucose, Bld: 149 mg/dL — ABNORMAL HIGH (ref 65–99)
POTASSIUM: 3.7 mmol/L (ref 3.5–5.1)
SODIUM: 139 mmol/L (ref 135–145)

## 2017-01-26 LAB — GLUCOSE, CAPILLARY
GLUCOSE-CAPILLARY: 126 mg/dL — AB (ref 65–99)
GLUCOSE-CAPILLARY: 191 mg/dL — AB (ref 65–99)
Glucose-Capillary: 110 mg/dL — ABNORMAL HIGH (ref 65–99)
Glucose-Capillary: 153 mg/dL — ABNORMAL HIGH (ref 65–99)

## 2017-01-26 LAB — CBC
HCT: 44.4 % (ref 40.0–52.0)
HEMOGLOBIN: 15.3 g/dL (ref 13.0–18.0)
MCH: 30.8 pg (ref 26.0–34.0)
MCHC: 34.4 g/dL (ref 32.0–36.0)
MCV: 89.3 fL (ref 80.0–100.0)
Platelets: 214 10*3/uL (ref 150–440)
RBC: 4.98 MIL/uL (ref 4.40–5.90)
RDW: 13 % (ref 11.5–14.5)
WBC: 12.3 10*3/uL — ABNORMAL HIGH (ref 3.8–10.6)

## 2017-01-26 LAB — ECHOCARDIOGRAM COMPLETE
HEIGHTINCHES: 68 in
WEIGHTICAEL: 2349.22 [oz_av]

## 2017-01-26 NOTE — Progress Notes (Signed)
Good day. Knowledge increases in diet and disease process. Paperwork for reduced prescription  prices given to patient. Interpreter in and out to asisst with language barrier. Denies pain. Tolerating diet.

## 2017-01-26 NOTE — Progress Notes (Signed)
1825 Report given to [facility] on 2A. [phone] Transferred patient via bed to 233.

## 2017-01-26 NOTE — Care Management Note (Signed)
Case Management Note  Patient Details  Name: Lucas [name] MRN: [id number] Date of Birth: Apr 08, 1955  Subjective/Objective:                  Met with patient with the assistance of Spanish interpreter.  He does not have a PCP or health insurance. He lives with his wife that actually works here at [employer]. Patient is currently on Brilinta however I consulted with pharmacy and Plavix may be a better choice for long-term.  Brilinta coupon delivered to patient along with application (Spanish version) to [facility] and [facility].   Action/Plan:  Referral to [facility] with patient's permission.   Expected Discharge Date:  01/28/17               Expected Discharge Plan:     In-House Referral:     Discharge planning Services  CM Consult, Medication Assistance, [facility]  Post Acute Care Choice:    Choice offered to:  (S) Patient(Spanish interpreter )  DME Arranged:    DME Agency:     HH Arranged:    [place] Agency:     Status of Service:  In process, will continue to follow  If discussed at Long Length of Stay Meetings, dates discussed:    Additional Comments:  Marshell Garfinkel, RN 01/26/2017, 10:47 AM

## 2017-01-26 NOTE — Progress Notes (Signed)
[place] at [place] NAME: Lucas [name]    MR#:  [id number]  DATE OF BIRTH:  [birth year]  SUBJECTIVE:  CHIEF COMPLAINT:   Chief Complaint  Patient presents with  . Chest Pain     Came with chest pain- have STEMI- LAD stent placed. No complains today.  REVIEW OF SYSTEMS:  CONSTITUTIONAL: No fever, fatigue or weakness.  EYES: No blurred or double vision.  EARS, NOSE, AND THROAT: No tinnitus or ear pain.  RESPIRATORY: No cough, shortness of breath, wheezing or hemoptysis.  CARDIOVASCULAR: No chest pain, orthopnea, edema.  GASTROINTESTINAL: No nausea, vomiting, diarrhea or abdominal pain.  GENITOURINARY: No dysuria, hematuria.  ENDOCRINE: No polyuria, nocturia,  HEMATOLOGY: No anemia, easy bruising or bleeding SKIN: No rash or lesion. MUSCULOSKELETAL: No joint pain or arthritis.   NEUROLOGIC: No tingling, numbness, weakness.  PSYCHIATRY: No anxiety or depression.   ROS  DRUG ALLERGIES:  No Known Allergies  VITALS:  Blood pressure 113/81, pulse 73, temperature 99 F (37.2 C), resp. rate 19, height 5\' 8"  (1.727 m), weight 66.6 kg (146 lb 13.2 oz), SpO2 96 %.  PHYSICAL EXAMINATION:  GENERAL:  61 y.o.-year-old patient lying in the bed with no acute distress.  EYES: Pupils equal, round, reactive to light and accommodation. No scleral icterus. Extraocular muscles intact.  HEENT: Head atraumatic, normocephalic. Oropharynx and nasopharynx clear.  NECK:  Supple, no jugular venous distention. No thyroid enlargement, no tenderness.  LUNGS: Normal breath sounds bilaterally, no wheezing, rales,rhonchi or crepitation. No use of accessory muscles of respiration.  CARDIOVASCULAR: S1, S2 normal. No murmurs, rubs, or gallops.  ABDOMEN: Soft, nontender, nondistended. Bowel sounds present. No organomegaly or mass.  EXTREMITIES: No pedal edema, cyanosis, or clubbing.  NEUROLOGIC: Cranial nerves II through XII are intact. Muscle strength 5/5 in all  extremities. Sensation intact. Gait not checked.  PSYCHIATRIC: The patient is alert and oriented x 3.  SKIN: No obvious rash, lesion, or ulcer.   Physical Exam LABORATORY PANEL:   CBC Recent Labs  Lab 01/26/17 0255  WBC 12.3*  HGB 15.3  HCT 44.4  PLT 214   ------------------------------------------------------------------------------------------------------------------  Chemistries  Recent Labs  Lab 01/25/17 1730 01/26/17 0255  NA 138 139  K 3.6 3.7  CL 104 107  CO2 24 23  GLUCOSE 196* 149*  BUN 13 13  CREATININE 0.77 0.78  CALCIUM 9.3 8.8*  AST 230*  --   ALT 49  --   ALKPHOS 63  --   BILITOT 0.7  --    ------------------------------------------------------------------------------------------------------------------  Cardiac Enzymes Recent Labs  Lab 01/26/17 0255 01/26/17 0853  TROPONINI >65.00* 46.55*   ------------------------------------------------------------------------------------------------------------------  RADIOLOGY:  Dg Chest Port 1 View  Result Date: 01/25/2017 CLINICAL DATA:  Patient with chest pain for 1 month. EXAM: PORTABLE CHEST 1 VIEW COMPARISON:  None. FINDINGS: Pacing pad overlies the left hemithorax. Normal cardiac and mediastinal contours. No consolidative pulmonary opacities. No pleural effusion or pneumothorax. IMPRESSION: No acute cardiopulmonary process. Electronically Signed   By: Lovey Newcomer M.D.   On: 01/25/2017 18:14   Ct Angio Chest/abd/pel For Dissection W And/or Wo Contrast  Result Date: 01/25/2017 CLINICAL DATA:  Chest pain for 1 month EXAM: CT ANGIOGRAPHY CHEST, ABDOMEN AND PELVIS TECHNIQUE: Multidetector CT imaging through the chest, abdomen and pelvis was performed using the standard protocol during bolus administration of intravenous contrast. Multiplanar reconstructed images and MIPs were obtained and reviewed to evaluate the vascular anatomy. CONTRAST:  129mL ISOVUE-370 IOPAMIDOL (ISOVUE-370) INJECTION 76%  COMPARISON:   01/25/2017 FINDINGS: CTA CHEST FINDINGS Cardiovascular: Non contrasted images of the chest demonstrate no intramural hematoma. nonaneurysmal aorta. No dissection is seen. Mild coronary artery calcification. Minimal atherosclerotic calcifications of the aorta. Normal heart size. Trace pericardial fluid. Mediastinum/Nodes: Multiple calcified lymph nodes consistent with prior granulomatous disease. Midline trachea. No thyroid mass. Esophagus within normal limits. Lungs/Pleura: Calcified right upper lobe pulmonary nodule, likely a granuloma given calcified nodes. No pleural effusion or consolidation. Negative for a pneumothorax Musculoskeletal: No chest wall abnormality. No acute or significant osseous findings. Review of the MIP images confirms the above findings. CTA ABDOMEN AND PELVIS FINDINGS VASCULAR Aorta: Normal caliber aorta without aneurysm, dissection, vasculitis or significant stenosis. Celiac: Patent without evidence of aneurysm, dissection, vasculitis or significant stenosis. SMA: Patent without evidence of aneurysm, dissection, vasculitis or significant stenosis. Renals: Both renal arteries are patent without evidence of aneurysm, dissection, vasculitis, fibromuscular dysplasia or significant stenosis. IMA: Patent without evidence of aneurysm, dissection, vasculitis or significant stenosis. Inflow: Patent without evidence of aneurysm, dissection, vasculitis or significant stenosis. Veins: No obvious venous abnormality within the limitations of this arterial phase study. Review of the MIP images confirms the above findings. NON-VASCULAR Hepatobiliary: No focal liver abnormality is seen. No gallstones, gallbladder wall thickening, or biliary dilatation. Pancreas: Unremarkable. No pancreatic ductal dilatation or surrounding inflammatory changes. Spleen: Heterogeneous, likely due to arterial phase enhancement Adrenals/Urinary Tract: Adrenal glands are unremarkable. Kidneys are normal, without renal calculi,  focal lesion, or hydronephrosis. Mild asymmetric thickening of the right anterior bladder wall. Stomach/Bowel: Stomach is within normal limits. Appendix not well seen. Diffuse diverticular disease of the colon without acute inflammation No evidence of bowel wall thickening, distention, or inflammatory changes. Lymphatic: No significantly enlarged lymph nodes Reproductive: Prostate gland is enlarged. Other: Negative for free air or free fluid. Right greater than left fat containing inguinal hernia. Musculoskeletal: No acute or significant osseous findings. Review of the MIP images confirms the above findings. IMPRESSION: 1. Negative for acute aortic dissection or aneurysm 2. Clear lung fields.  Evidence of prior granulomatous disease 3. Minimal atherosclerotic calcification and coronary calcification. 4. No CT evidence for acute intra-abdominal or pelvic abnormality 5. Diffuse diverticular disease of the colon without acute inflammation 6. Mild asymmetric wall thickening of the right anterior bladder, correlate clinically for any urinary tract symptoms. Electronically Signed   By: Donavan Foil M.D.   On: 01/25/2017 18:45    ASSESSMENT AND PLAN:   Principal Problem:   STEMI (ST elevation myocardial infarction) ([place]) Active Problems:   STEMI involving left anterior descending coronary artery ([place])   1. Anterior ST elevation MI: -Late presenting anterior STEMI with acute plaque rupture and 100% thrombotic occlusion of the proximal LAD s/p PCI/DES  -Currently, chest pain free -Troponin remains > 65, continue to cycle until down trending -ASA and Brilinta for at least 12 months -Post  Cath H/H and renal function stable -Cardiac rehab  2. Acute systolic CHF/ICM: -He does not appear grossly volume up -TTE pending -Soft BP precludes titration of Coreg or addition of ACEi/ARB/spironolactone at this time. Start when able -If EF is < 35% by TTE, recommend LifeVest prior to discharge   3.  HTN: -Stable -Slightly soft BP precludes titration of Coreg  4. HLD: -LDL 114, goal < 70 -Lipitor  5. Hyperglycemia: -A1c pending  6. Leukocytosis: -No obvious signs of infection -Likely inflammatory  -Monitor   7. Dispo: -Can likely transfer to 2A today -Ambulate and likely d./c tomorrow.  Due to No insurance,  he will need assistance regarding his meds. Case manager helping with that.   All the records are reviewed and case discussed with Care Management/Social Workerr. Management plans discussed with the patient, family and they are in agreement.  CODE STATUS: Full.  TOTAL TIME TAKING CARE OF THIS PATIENT: 35 minutes.    POSSIBLE D/C IN 1-2 DAYS, DEPENDING ON CLINICAL CONDITION.   Vaughan Basta M.D on 01/26/2017   Between 7am to 6pm - Pager - [phone]  After 6pm go to [email or web address] - password [email or web address] [facility]  Office  [phone]  CC: Primary care physician; No primary care provider on file.  Note: This dictation was prepared with Dragon dictation along with smaller phrase technology. Any transcriptional errors that result from this process are unintentional.

## 2017-01-26 NOTE — Plan of Care (Signed)
Nutrition Education Note  RD consulted for nutrition education regarding a Heart Healthy diet.   Lipid Panel     Component Value Date/Time   CHOL 184 01/25/2017 1730   TRIG 132 01/25/2017 1730   HDL 44 01/25/2017 1730   CHOLHDL 4.2 01/25/2017 1730   VLDL 26 01/25/2017 1730   LDLCALC 114 (H) 01/25/2017 1730   Met with patient, his wife, and his son at bedside. Provided education with assistance from Spanish interpreter Environmental education officer). Patient reports he has received some nutrition education before from a nutrition club/class he was in. He typically eats 3 meals per day and meals are usually prepared by his wife (who works here at [employer]). Breakfast is usually cereal or chicken soup/broth. Lunch is rice and beans or tortillas. Dinner is usually rice and beans or tortillas and a fruit (banana or apple). He reports they also purchase spinach and lettuce for salads every week at the grocery store (did not specify whether he actually eats them or not). He reports they typically prepare fresh foods and do not cook with a lot of salt. He reports his appetite is good. He has been weight stable at around 145 lbs for a while (used to weigh 175 lbs 8 years ago before losing weight intentionally).  RD provided "Heart Healthy Nutrition Therapy" handout from the Academy of [employer]etics. Reviewed patient's dietary recall. Provided examples on ways to decrease sodium and fat intake in diet. Discouraged intake of processed foods and use of salt shaker. Encouraged fresh fruits and vegetables as well as whole grain sources of carbohydrates to maximize fiber intake. Teach back method used.  Expect good compliance.  Body mass index is 22.32 kg/m. Pt meets criteria for normal weight based on current BMI.  Current diet order is 2 gram sodium. He reports he is eating well though no meal percentage documented in chart at this time. Labs and medications reviewed. No further nutrition interventions warranted  at this time. RD contact information provided. If additional nutrition issues arise, please re-consult RD.  Willey Blade, MS, [place], LDN Office: [phone] Pager: [phone] After Hours/Weekend Pager: [phone]

## 2017-01-26 NOTE — Progress Notes (Signed)
Progress Note  Patient Name: Lucas [name] Date of Encounter: 01/26/2017  Primary Cardiologist: New to [facility] - consult by End  Subjective   No chest pain or SOB.   Inpatient Medications    Scheduled Meds: . aspirin  81 mg Oral Daily  . atorvastatin  80 mg Oral q1800  . carvedilol  3.125 mg Oral BID WC  . enoxaparin (LOVENOX) injection  40 mg Subcutaneous Q24H  . insulin aspart  0-5 Units Subcutaneous QHS  . insulin aspart  0-9 Units Subcutaneous TID WC  . sodium chloride flush  3 mL Intravenous Q12H  . ticagrelor  90 mg Oral BID   Continuous Infusions: . sodium chloride    . sodium chloride     PRN Meds: sodium chloride, acetaminophen, ondansetron (ZOFRAN) IV, oxyCODONE, sodium chloride flush   Vital Signs    Vitals:   01/26/17 0000 01/26/17 0400 01/26/17 0500 01/26/17 0600  BP: 114/80 95/62 98/67  106/71  Pulse: 86 73 84   Resp: 18 18 14 16   Temp: 98.8 F (37.1 C) 99.1 F (37.3 C)    TempSrc: Oral Oral    SpO2: 94% 95% 94%   Weight:      Height:        Intake/Output Summary (Last 24 hours) at 01/26/2017 0733 Last data filed at 01/26/2017 0600 Gross per 24 hour  Intake 150 ml  Output 960 ml  Net -810 ml   Filed Weights   01/25/17 1725 01/25/17 2000  Weight: 150 lb (68 kg) 146 lb 13.2 oz (66.6 kg)    Telemetry    NSR - Personally Reviewed  ECG    NSR, 77 bpm, anteroseptal infarct  - Personally Reviewed  Physical Exam   GEN: No acute distress.   Neck: No JVD. Cardiac: RRR, no murmurs, rubs, or gallops. Cardiac cath site without bleeding, bruising, swelling, erythema, or TTP. Radial pulse 2+. Respiratory: Clear to auscultation bilaterally.  GI: Soft, nontender, non-distended.   MS: No edema; No deformity. Neuro:  Alert and oriented x 3; Nonfocal.  Psych: Normal affect.  Labs    Chemistry Recent Labs  Lab 01/25/17 1730 01/26/17 0255  NA 138 139  K 3.6 3.7  CL 104 107  CO2 24 23  GLUCOSE 196* 149*  BUN 13 13  CREATININE  0.77 0.78  CALCIUM 9.3 8.8*  PROT 7.4  --   ALBUMIN 4.3  --   AST 230*  --   ALT 49  --   ALKPHOS 63  --   BILITOT 0.7  --   GFRNONAA >60 >60  GFRAA >60 >60  ANIONGAP 10 9     Hematology Recent Labs  Lab 01/25/17 1730 01/26/17 0255  WBC 10.9* 12.3*  RBC 5.16 4.98  HGB 16.2 15.3  HCT 46.6 44.4  MCV 90.4 89.3  MCH 31.3 30.8  MCHC 34.6 34.4  RDW 13.1 13.0  PLT 215 214    Cardiac Enzymes Recent Labs  Lab 01/25/17 1730 01/25/17 2316 01/26/17 0255  TROPONINI 37.34* >65.00* >65.00*   No results for input(s): TROPIPOC in the last 168 hours.   BNPNo results for input(s): BNP, PROBNP in the last 168 hours.   DDimer No results for input(s): DDIMER in the last 168 hours.   Radiology    Dg Chest Port 1 View  Result Date: 01/25/2017 IMPRESSION: No acute cardiopulmonary process. Electronically Signed   By: Lovey Newcomer M.D.   On: 01/25/2017 18:14   Ct Angio Chest/abd/pel For Dissection W  And/or Wo Contrast  Result Date: 01/25/2017 IMPRESSION: 1. Negative for acute aortic dissection or aneurysm 2. Clear lung fields.  Evidence of prior granulomatous disease 3. Minimal atherosclerotic calcification and coronary calcification. 4. No CT evidence for acute intra-abdominal or pelvic abnormality 5. Diffuse diverticular disease of the colon without acute inflammation 6. Mild asymmetric wall thickening of the right anterior bladder, correlate clinically for any urinary tract symptoms. Electronically Signed   By: Donavan Foil M.D.   On: 01/25/2017 18:45    Cardiac Studies   LHC 01/25/2017: Coronary Findings   Diagnostic  Dominance: Right  Left Main  Vessel is large.  Mid LM to Dist LM lesion 20% stenosed  Mid LM to Dist LM lesion is 20% stenosed.  Left Anterior Descending  Vessel is large.  Ost LAD lesion 20% stenosed  Ost LAD lesion is 20% stenosed.  Prox LAD lesion 100% stenosed  Prox LAD lesion is 100% stenosed. Vessel is the culprit lesion. The lesion is type B2 and  thrombotic.  Mid LAD lesion 25% stenosed  Mid LAD lesion is 25% stenosed.  First Diagonal Branch  Vessel is moderate in size.  Ost 1st Diag lesion 50% stenosed  Ost 1st Diag lesion is 50% stenosed.  Second Diagonal Branch  Vessel is moderate in size.  Third Diagonal Branch  Vessel is small in size.  Left Circumflex  Vessel is moderate in size.  First Obtuse Marginal Branch  Vessel is moderate in size.  Second Obtuse Marginal Branch  Vessel is moderate in size.  Third Obtuse Marginal Branch  Vessel is small in size.  Right Coronary Artery  Vessel is large.  Prox RCA lesion 30% stenosed  Prox RCA lesion is 30% stenosed.  Right Posterior Descending Artery  Vessel is moderate in size.  Right Posterior Atrioventricular Branch  Vessel is moderate in size.  Intervention   Prox LAD lesion  Stent  Lesion length: 20 mm. Lesion crossed with guidewire using a WIRE RUNTHROUGH .[id number]. Pre-stent angioplasty was performed using a BALLOON TREK RX [id number]. A drug-eluting stent was successfully placed using a STENT SIERRA 3.00 X 23 MM. Maximum pressure: 16 atm. Stent strut is well apposed. Post-stent angioplasty was performed using a BALLOON [place] [id number]. Maximum pressure: 16 atm.  Post-Intervention Lesion Assessment  The intervention was successful. Pre-interventional TIMI flow is 0. Post-intervention TIMI flow is 3. Treated lesion length: 20 mm. No complications occurred at this lesion.  There is no residual stenosis post intervention.  Wall Motion              Left Heart   Left Ventricle The left ventricular size is normal. There is moderate to severe left ventricular systolic dysfunction. The left ventricular ejection fraction is 35-45% by visual estimate. There are LV function abnormalities due to segmental dysfunction.  Aortic Valve There is no aortic valve stenosis.  Coronary Diagrams   Diagnostic Diagram       Post-Intervention Diagram          Recommendations: 1. Admit to ICU; patient will need to remain hospitalized at least 48 hours, given large anterior MI. 2. Dual antiplatelet therapy with aspirin and ticagrelor for at least 12 months, ideally longer. 3. Aggressive secondary prevention, including high-intensity statin therapy. 4. Obtain transthoracic echocardiogram to better assess LVEF. If LVEF < 35%, I recommend LifeVest on discharge given large anterior MI. 5. Gentle diuresis, given elevated LVEDP. 6. Initiate carvedilol 3.125 mg BID with uptitration as tolerated. ACEI/ARB +/- aldosterone antagonist should be added  prior to discharge based on renal function.   TTE pending.    Patient Profile     61 y.o. male with no prior history who presented to [facility] on 11/25 with anterior STEMI.   Assessment & Plan    1. Anterior ST elevation MI: -Late presenting anterior STEMI with acute plaque rupture and 100% thrombotic occlusion of the proximal LAD s/p PCI/DES as detailed above -Currently, chest pain free -Troponin remains > 65, continue to cycle until down trending -ASA and Brilinta for at least 12 months -Post  Cath H/H and renal function stable -Cardiac rehab  2. Acute systolic CHF/ICM: -He does not appear grossly volume up -TTE pending -Soft BP precludes titration of Coreg or addition of ACEi/ARB/spironolactone at this time. Start when able -If EF is < 35% by TTE, recommend LifeVest prior to discharge   3. HTN: -Stable -Slightly soft BP precludes titration of Coreg  4. HLD: -LDL 114, goal < 70 -Lipitor  5. Hyperglycemia: -A1c pending  6. Leukocytosis: -No obvious signs of infection -Likely inflammatory  -Monitor   7. Dispo: -Can likely transfer to 2A today -Ambulate   For questions or updates, please contact [place] Please consult [email or web address] for contact info under Cardiology/STEMI.    Signed, Christell Faith, PA-C [place] Pager: [phone] 01/26/2017, 7:33 AM

## 2017-01-26 NOTE — Progress Notes (Signed)
No new complaints Denies chest pain No distress  Vitals:   01/26/17 1000 01/26/17 1100  BP: 105/67 107/82  Pulse: 86 73  Resp: 16 17  Temp:    SpO2: 95% 96%      Gen: NAD HEENT: NCAT, sclerae white, oropharynx normal Neck: No LAN, no JVD noted Lungs: full BS, normal percussion note, no adventitious sounds Cardiovascular: Reg, no M noted Abdomen: Soft, NT, +BS Ext: no C/C/E Neuro: grossly intact   BMP Latest Ref Rng & Units 01/26/2017 01/25/2017  Glucose 65 - 99 mg/dL 149(H) 196(H)  BUN 6 - 20 mg/dL 13 13  Creatinine 0.61 - 1.24 mg/dL 0.78 0.77  Sodium 135 - 145 mmol/L 139 138  Potassium 3.5 - 5.1 mmol/L 3.7 3.6  Chloride 101 - 111 mmol/L 107 104  CO2 22 - 32 mmol/L 23 24  Calcium 8.9 - 10.3 mg/dL 8.8(L) 9.3    CBC Latest Ref Rng & Units 01/26/2017 01/25/2017  WBC 3.8 - 10.6 K/uL 12.3(H) 10.9(H)  Hemoglobin 13.0 - 18.0 g/dL 15.3 16.2  Hematocrit 40.0 - 52.0 % 44.4 46.6  Platelets 150 - 440 K/uL 214 215    No new CXR  IMPRESSION: STEMI, S/P LHC with DES - uncomplicated course DM 2 - controlled  PLAN/REC: Transfer to [place] cath care per cardiology  After transfer, PCCM will sign off. Please call if we can be of further assistance    Merton Border, MD PCCM service Mobile [phone] Pager [phone] 01/26/2017 12:36 PM

## 2017-01-26 NOTE — Progress Notes (Signed)
    Patient without insurance. After his 30-day free trial of Brilinta it will likely cost about 200-250/month. Will keep patient on Brilinta 90 mg bid for the first 30 days with the free card. Following this, he will be transitioned to Plavix 75 mg daily as an outpatient.

## 2017-01-26 NOTE — Progress Notes (Signed)
*  PRELIMINARY RESULTS* Echocardiogram 2D Echocardiogram has been performed.  Sherrie Sport 01/26/2017, 3:31 PM

## 2017-01-26 NOTE — Plan of Care (Signed)
Patient rested well this shift. Interpreter used for communication.  Son at bedside until midnight.  No bleeding noted at LAD site. On call ENT notified of elevated Troponin.  E-link notified of EKG results.  No new orders.  All meds taken and tolerated well.  No adverse affects noted from procedure.  Will continue to monitor.

## 2017-01-27 DIAGNOSIS — E785 Hyperlipidemia, unspecified: Secondary | ICD-10-CM

## 2017-01-27 DIAGNOSIS — I255 Ischemic cardiomyopathy: Secondary | ICD-10-CM

## 2017-01-27 LAB — BASIC METABOLIC PANEL
ANION GAP: 11 (ref 5–15)
BUN: 14 mg/dL (ref 6–20)
CO2: 22 mmol/L (ref 22–32)
Calcium: 8.9 mg/dL (ref 8.9–10.3)
Chloride: 104 mmol/L (ref 101–111)
Creatinine, Ser: 0.83 mg/dL (ref 0.61–1.24)
Glucose, Bld: 243 mg/dL — ABNORMAL HIGH (ref 65–99)
POTASSIUM: 3.8 mmol/L (ref 3.5–5.1)
SODIUM: 137 mmol/L (ref 135–145)

## 2017-01-27 LAB — GLUCOSE, CAPILLARY
GLUCOSE-CAPILLARY: 112 mg/dL — AB (ref 65–99)
GLUCOSE-CAPILLARY: 211 mg/dL — AB (ref 65–99)
GLUCOSE-CAPILLARY: 138 mg/dL — AB (ref 65–99)
Glucose-Capillary: 135 mg/dL — ABNORMAL HIGH (ref 65–99)

## 2017-01-27 LAB — HEMOGLOBIN A1C
HEMOGLOBIN A1C: 6 % — AB (ref 4.8–5.6)
MEAN PLASMA GLUCOSE: 126 mg/dL

## 2017-01-27 MED ORDER — LISINOPRIL 5 MG PO TABS
2.5000 mg | ORAL_TABLET | Freq: Every day | ORAL | Status: DC
  Administered 2017-01-28: 2.5 mg via ORAL
  Filled 2017-01-27 (×2): qty 1

## 2017-01-27 MED ORDER — POTASSIUM CHLORIDE CRYS ER 20 MEQ PO TBCR
20.0000 meq | EXTENDED_RELEASE_TABLET | Freq: Once | ORAL | Status: AC
  Administered 2017-01-27: 20 meq via ORAL
  Filled 2017-01-27: qty 1

## 2017-01-27 MED ORDER — POTASSIUM CHLORIDE CRYS ER 10 MEQ PO TBCR
10.0000 meq | EXTENDED_RELEASE_TABLET | Freq: Every day | ORAL | Status: DC
  Administered 2017-01-27 – 2017-01-28 (×2): 10 meq via ORAL
  Filled 2017-01-27 (×3): qty 1

## 2017-01-27 MED ORDER — FUROSEMIDE 20 MG PO TABS
20.0000 mg | ORAL_TABLET | Freq: Every day | ORAL | Status: DC
  Administered 2017-01-27: 20 mg via ORAL
  Filled 2017-01-27 (×2): qty 1

## 2017-01-27 NOTE — Care Management Note (Addendum)
Case Management Note  Patient Details  Name: Lucas [name] MRN: [id number] Date of Birth: 1956/01/01  Subjective/Objective:                 Informed patient in need of [place] due to high potential for sudden cardiac death.  Through interpretor:  Patient does not meet the residency requirements for the [place].  He would not be eligible for medicaid or assistance through drug company ie- for [place] because recipients of assist must be [place] citizens.  Monthly income for family is 500 dollar every two weeks in a family with four dependents.  If [facility] agrees to pay lease for one month on the vest- [employer] will cover the next two months which would allow time to determine if needs internal defibrillator   Action/Plan:  Faxed application to [facility] and provided patient with list of additional items needed- picture ID, Proof of residence, proof of income.  Faxed [employer] referral.  Have sent email to [place] directors requesting approval for lease agreement and faxed the [place] for approval or denial.  Left voicemail message for [facility]  Expected Discharge Date:  01/28/17               Expected Discharge Plan:     In-House Referral:     Discharge planning Services  CM Consult, Medication Assistance, [facility]  Post Acute Care Choice:    Choice offered to:  (S) Patient(Spanish interpreter )  DME Arranged:    DME Agency:     HH Arranged:    [place] Agency:     Status of Service:  In process, will continue to follow  If discussed at Long Length of Stay Meetings, dates discussed:    Additional Comments:  Katrina Stack, RN 01/27/2017, 1:28 PM

## 2017-01-27 NOTE — Progress Notes (Signed)
Progress Note  Patient Name: Lucas [name] Date of Encounter: 01/27/2017  Primary Cardiologist: New to [facility] - consult by End   Subjective   No chest pain. Improving SOB. Medical interpreter used for encounter.   Inpatient Medications    Scheduled Meds: . aspirin  81 mg Oral Daily  . atorvastatin  80 mg Oral q1800  . carvedilol  3.125 mg Oral BID WC  . enoxaparin (LOVENOX) injection  40 mg Subcutaneous Q24H  . insulin aspart  0-5 Units Subcutaneous QHS  . insulin aspart  0-9 Units Subcutaneous TID WC  . sodium chloride flush  3 mL Intravenous Q12H  . ticagrelor  90 mg Oral BID   Continuous Infusions: . sodium chloride     PRN Meds: sodium chloride, acetaminophen, ondansetron (ZOFRAN) IV, sodium chloride flush   Vital Signs    Vitals:   01/26/17 1500 01/26/17 1901 01/27/17 0428 01/27/17 0727  BP: 113/81 119/70 112/69 127/80  Pulse:  (!) 102 84 84  Resp: 19 18  18   Temp:  98 F (36.7 C) 98.4 F (36.9 C) 98.1 F (36.7 C)  TempSrc:  Oral Oral Oral  SpO2:  95% 95% 98%  Weight:      Height:        Intake/Output Summary (Last 24 hours) at 01/27/2017 0805 Last data filed at 01/27/2017 0700 Gross per 24 hour  Intake 0 ml  Output 600 ml  Net -600 ml   Filed Weights   01/25/17 1725 01/25/17 2000  Weight: 150 lb (68 kg) 146 lb 13.2 oz (66.6 kg)    Telemetry    NSR - Personally Reviewed  ECG    n/a - Personally Reviewed  Physical Exam   GEN: No acute distress.   Neck: No JVD. Cardiac: RRR, no murmurs, rubs, or gallops. Cardiac cath site without bleeding, bruising, swelling, erythema, or TTP. Radial pulse 2+. Respiratory: Clear to auscultation bilaterally.  GI: Soft, nontender, non-distended.   MS: No edema; No deformity. Neuro:  Alert and oriented x 3; Nonfocal.  Psych: Normal affect.  Labs    Chemistry Recent Labs  Lab 01/25/17 1730 01/26/17 0255  NA 138 139  K 3.6 3.7  CL 104 107  CO2 24 23  GLUCOSE 196* 149*  BUN 13 13    CREATININE 0.77 0.78  CALCIUM 9.3 8.8*  PROT 7.4  --   ALBUMIN 4.3  --   AST 230*  --   ALT 49  --   ALKPHOS 63  --   BILITOT 0.7  --   GFRNONAA >60 >60  GFRAA >60 >60  ANIONGAP 10 9     Hematology Recent Labs  Lab 01/25/17 1730 01/26/17 0255  WBC 10.9* 12.3*  RBC 5.16 4.98  HGB 16.2 15.3  HCT 46.6 44.4  MCV 90.4 89.3  MCH 31.3 30.8  MCHC 34.6 34.4  RDW 13.1 13.0  PLT 215 214    Cardiac Enzymes Recent Labs  Lab 01/25/17 1730 01/25/17 2316 01/26/17 0255 01/26/17 0853  TROPONINI 37.34* >65.00* >65.00* 46.55*   No results for input(s): TROPIPOC in the last 168 hours.   BNPNo results for input(s): BNP, PROBNP in the last 168 hours.   DDimer No results for input(s): DDIMER in the last 168 hours.   Radiology    Dg Chest Port 1 View  Result Date: 01/25/2017 IMPRESSION: No acute cardiopulmonary process. Electronically Signed   By: Lovey Newcomer M.D.   On: 01/25/2017 18:14   Ct Angio Chest/abd/pel For  Dissection W And/or Wo Contrast  Result Date: 01/25/2017 IMPRESSION: 1. Negative for acute aortic dissection or aneurysm 2. Clear lung fields.  Evidence of prior granulomatous disease 3. Minimal atherosclerotic calcification and coronary calcification. 4. No CT evidence for acute intra-abdominal or pelvic abnormality 5. Diffuse diverticular disease of the colon without acute inflammation 6. Mild asymmetric wall thickening of the right anterior bladder, correlate clinically for any urinary tract symptoms. Electronically Signed   By: Donavan Foil M.D.   On: 01/25/2017 18:45    Cardiac Studies   LHC 01/25/2017: Coronary Findings   Diagnostic  Dominance: Right  Left Main  Vessel is large.  Mid LM to Dist LM lesion 20% stenosed  Mid LM to Dist LM lesion is 20% stenosed.  Left Anterior Descending  Vessel is large.  Ost LAD lesion 20% stenosed  Ost LAD lesion is 20% stenosed.  Prox LAD lesion 100% stenosed  Prox LAD lesion is 100% stenosed. Vessel is the culprit  lesion. The lesion is type B2 and thrombotic.  Mid LAD lesion 25% stenosed  Mid LAD lesion is 25% stenosed.  First Diagonal Branch  Vessel is moderate in size.  Ost 1st Diag lesion 50% stenosed  Ost 1st Diag lesion is 50% stenosed.  Second Diagonal Branch  Vessel is moderate in size.  Third Diagonal Branch  Vessel is small in size.  Left Circumflex  Vessel is moderate in size.  First Obtuse Marginal Branch  Vessel is moderate in size.  Second Obtuse Marginal Branch  Vessel is moderate in size.  Third Obtuse Marginal Branch  Vessel is small in size.  Right Coronary Artery  Vessel is large.  Prox RCA lesion 30% stenosed  Prox RCA lesion is 30% stenosed.  Right Posterior Descending Artery  Vessel is moderate in size.  Right Posterior Atrioventricular Branch  Vessel is moderate in size.  Intervention   Prox LAD lesion  Stent  Lesion length: 20 mm. Lesion crossed with guidewire using a WIRE RUNTHROUGH .[id number]. Pre-stent angioplasty was performed using a BALLOON TREK RX [id number]. A drug-eluting stent was successfully placed using a STENT SIERRA 3.00 X 23 MM. Maximum pressure: 16 atm. Stent strut is well apposed. Post-stent angioplasty was performed using a BALLOON [place] [id number]. Maximum pressure: 16 atm.  Post-Intervention Lesion Assessment  The intervention was successful. Pre-interventional TIMI flow is 0. Post-intervention TIMI flow is 3. Treated lesion length: 20 mm. No complications occurred at this lesion.  There is no residual stenosis post intervention.  Wall Motion               Left Heart   Left Ventricle The left ventricular size is normal. There is moderate to severe left ventricular systolic dysfunction. The left ventricular ejection fraction is 35-45% by visual estimate. There are LV function abnormalities due to segmental dysfunction.  Aortic Valve There is no aortic valve stenosis.  Coronary Diagrams   Diagnostic Diagram         Post-Intervention Diagram         Recommendations: 1. Admit to ICU; patient will need to remain hospitalized at least 48 hours, given large anterior MI. 2. Dual antiplatelet therapy with aspirin and ticagrelor for at least 12 months, ideally longer. 3. Aggressive secondary prevention, including high-intensity statin therapy. 4. Obtain transthoracic echocardiogram to better assess LVEF. If LVEF < 35%, I recommend LifeVest on discharge given large anterior MI. 5. Gentle diuresis, given elevated LVEDP. 6. Initiate carvedilol 3.125 mg BID with uptitration as tolerated. ACEI/ARB +/- aldosterone  antagonist should be added prior to discharge based on renal function.   TTE 01/26/2017: Study Conclusions  - Left ventricle: The cavity size was normal. There was moderate   concentric hypertrophy. Systolic function was moderately to   severely reduced. The estimated ejection fraction was in the   range of 30% to 35%. There is severe hypokinesis of the   anteroseptal, anterior, and apical myocardium. Doppler parameters   are consistent with abnormal left ventricular relaxation (grade 1   diastolic dysfunction). - Mitral valve: There was mild regurgitation.   Patient Profile     61 y.o. male with no prior history who presented to [facility] on 11/25 with anterior STEMI.   Assessment & Plan    1. Anterior ST elevation MI: -Late presenting anterior STEMI with acute plaque rupture and 100% thrombotic occlusion of the proximal LAD s/p PCI/DES as detailed above -Currently, chest pain free -Troponin peaked at > 65, now down trending -ASA and Brilinta for at least 12 months -Post  Cath H/H and renal function stable -Cardiac rehab  2. Acute systolic CHF/ICM: -He does not appear grossly volume up -TTE showed EF 30-35% as detailed above -Add lisinopril 2.5 mg daily -He does appear mildly volume up, add PO Lasix 20 mg daily Continue Coreg -If BP allows, consider addition of spironolactone  prior to discharge -Patient has agreed to [place], will need to be fitted prior to discharge   3. HTN: -Stable  4. HLD: -LDL 114, goal < 70 -Lipitor  5. Hyperglycemia: -A1c 6.0  6. Leukocytosis: -No obvious signs of infection -Likely inflammatory  -Monitor   7. Dispo: -Await LifeVest fitting -Ambulate     For questions or updates, please contact [place] Please consult [email or web address] for contact info under Cardiology/STEMI.    Signed, Christell Faith, PA-C [place] Pager: [phone] 01/27/2017, 8:05 AM

## 2017-01-27 NOTE — Progress Notes (Signed)
Interpreter [place] in to interpret.  Course of shift and medications reviewed with patient via interpreter.  No questions voiced per patient.

## 2017-01-27 NOTE — Progress Notes (Signed)
[facility] [place] NAME: Lucas [name]    MR#:  [id number]  DATE OF BIRTH:  1955/11/28  SUBJECTIVE:  CHIEF COMPLAINT:   Chief Complaint  Patient presents with  . Chest Pain     Came with chest pain- have STEMI- LAD stent placed. No complains today. EF is very low.  REVIEW OF SYSTEMS:  CONSTITUTIONAL: No fever, fatigue or weakness.  EYES: No blurred or double vision.  EARS, NOSE, AND THROAT: No tinnitus or ear pain.  RESPIRATORY: No cough, shortness of breath, wheezing or hemoptysis.  CARDIOVASCULAR: No chest pain, orthopnea, edema.  GASTROINTESTINAL: No nausea, vomiting, diarrhea or abdominal pain.  GENITOURINARY: No dysuria, hematuria.  ENDOCRINE: No polyuria, nocturia,  HEMATOLOGY: No anemia, easy bruising or bleeding SKIN: No rash or lesion. MUSCULOSKELETAL: No joint pain or arthritis.   NEUROLOGIC: No tingling, numbness, weakness.  PSYCHIATRY: No anxiety or depression.   ROS  DRUG ALLERGIES:  No Known Allergies  VITALS:  Blood pressure 100/65, pulse 91, temperature 98.1 F (36.7 C), temperature source Oral, resp. rate 18, height 5\' 8"  (1.727 m), weight 66.6 kg (146 lb 13.2 oz), SpO2 96 %.  PHYSICAL EXAMINATION:  GENERAL:  61 y.o.-year-old patient lying in the bed with no acute distress.  EYES: Pupils equal, round, reactive to light and accommodation. No scleral icterus. Extraocular muscles intact.  HEENT: Head atraumatic, normocephalic. Oropharynx and nasopharynx clear.  NECK:  Supple, no jugular venous distention. No thyroid enlargement, no tenderness.  LUNGS: Normal breath sounds bilaterally, no wheezing, rales,rhonchi or crepitation. No use of accessory muscles of respiration.  CARDIOVASCULAR: S1, S2 normal. No murmurs, rubs, or gallops.  ABDOMEN: Soft, nontender, nondistended. Bowel sounds present. No organomegaly or mass.  EXTREMITIES: No pedal edema, cyanosis, or clubbing.  NEUROLOGIC: Cranial nerves II through  XII are intact. Muscle strength 5/5 in all extremities. Sensation intact. Gait not checked.  PSYCHIATRIC: The patient is alert and oriented x 3.  SKIN: No obvious rash, lesion, or ulcer.   Physical Exam LABORATORY PANEL:   CBC Recent Labs  Lab 01/26/17 0255  WBC 12.3*  HGB 15.3  HCT 44.4  PLT 214   ------------------------------------------------------------------------------------------------------------------  Chemistries  Recent Labs  Lab 01/25/17 1730  01/27/17 1116  NA 138   < > 137  K 3.6   < > 3.8  CL 104   < > 104  CO2 24   < > 22  GLUCOSE 196*   < > 243*  BUN 13   < > 14  CREATININE 0.77   < > 0.83  CALCIUM 9.3   < > 8.9  AST 230*  --   --   ALT 49  --   --   ALKPHOS 63  --   --   BILITOT 0.7  --   --    < > = values in this interval not displayed.   ------------------------------------------------------------------------------------------------------------------  Cardiac Enzymes Recent Labs  Lab 01/26/17 0255 01/26/17 0853  TROPONINI >65.00* 46.55*   ------------------------------------------------------------------------------------------------------------------  RADIOLOGY:  Dg Chest Port 1 View  Result Date: 01/25/2017 CLINICAL DATA:  Patient with chest pain for 1 month. EXAM: PORTABLE CHEST 1 VIEW COMPARISON:  None. FINDINGS: Pacing pad overlies the left hemithorax. Normal cardiac and mediastinal contours. No consolidative pulmonary opacities. No pleural effusion or pneumothorax. IMPRESSION: No acute cardiopulmonary process. Electronically Signed   By: Lovey Newcomer M.D.   On: 01/25/2017 18:14   Ct Angio Chest/abd/pel For Dissection W And/or Wo Contrast  Result Date: 01/25/2017 CLINICAL DATA:  Chest pain for 1 month EXAM: CT ANGIOGRAPHY CHEST, ABDOMEN AND PELVIS TECHNIQUE: Multidetector CT imaging through the chest, abdomen and pelvis was performed using the standard protocol during bolus administration of intravenous contrast. Multiplanar  reconstructed images and MIPs were obtained and reviewed to evaluate the vascular anatomy. CONTRAST:  141mL ISOVUE-370 IOPAMIDOL (ISOVUE-370) INJECTION 76% COMPARISON:  01/25/2017 FINDINGS: CTA CHEST FINDINGS Cardiovascular: Non contrasted images of the chest demonstrate no intramural hematoma. nonaneurysmal aorta. No dissection is seen. Mild coronary artery calcification. Minimal atherosclerotic calcifications of the aorta. Normal heart size. Trace pericardial fluid. Mediastinum/Nodes: Multiple calcified lymph nodes consistent with prior granulomatous disease. Midline trachea. No thyroid mass. Esophagus within normal limits. Lungs/Pleura: Calcified right upper lobe pulmonary nodule, likely a granuloma given calcified nodes. No pleural effusion or consolidation. Negative for a pneumothorax Musculoskeletal: No chest wall abnormality. No acute or significant osseous findings. Review of the MIP images confirms the above findings. CTA ABDOMEN AND PELVIS FINDINGS VASCULAR Aorta: Normal caliber aorta without aneurysm, dissection, vasculitis or significant stenosis. Celiac: Patent without evidence of aneurysm, dissection, vasculitis or significant stenosis. SMA: Patent without evidence of aneurysm, dissection, vasculitis or significant stenosis. Renals: Both renal arteries are patent without evidence of aneurysm, dissection, vasculitis, fibromuscular dysplasia or significant stenosis. IMA: Patent without evidence of aneurysm, dissection, vasculitis or significant stenosis. Inflow: Patent without evidence of aneurysm, dissection, vasculitis or significant stenosis. Veins: No obvious venous abnormality within the limitations of this arterial phase study. Review of the MIP images confirms the above findings. NON-VASCULAR Hepatobiliary: No focal liver abnormality is seen. No gallstones, gallbladder wall thickening, or biliary dilatation. Pancreas: Unremarkable. No pancreatic ductal dilatation or surrounding inflammatory  changes. Spleen: Heterogeneous, likely due to arterial phase enhancement Adrenals/Urinary Tract: Adrenal glands are unremarkable. Kidneys are normal, without renal calculi, focal lesion, or hydronephrosis. Mild asymmetric thickening of the right anterior bladder wall. Stomach/Bowel: Stomach is within normal limits. Appendix not well seen. Diffuse diverticular disease of the colon without acute inflammation No evidence of bowel wall thickening, distention, or inflammatory changes. Lymphatic: No significantly enlarged lymph nodes Reproductive: Prostate gland is enlarged. Other: Negative for free air or free fluid. Right greater than left fat containing inguinal hernia. Musculoskeletal: No acute or significant osseous findings. Review of the MIP images confirms the above findings. IMPRESSION: 1. Negative for acute aortic dissection or aneurysm 2. Clear lung fields.  Evidence of prior granulomatous disease 3. Minimal atherosclerotic calcification and coronary calcification. 4. No CT evidence for acute intra-abdominal or pelvic abnormality 5. Diffuse diverticular disease of the colon without acute inflammation 6. Mild asymmetric wall thickening of the right anterior bladder, correlate clinically for any urinary tract symptoms. Electronically Signed   By: Donavan Foil M.D.   On: 01/25/2017 18:45    ASSESSMENT AND PLAN:   Principal Problem:   STEMI (ST elevation myocardial infarction) ([place]) Active Problems:   STEMI involving left anterior descending coronary artery ([place])   1. Anterior ST elevation MI: -Late presenting anterior STEMI with acute plaque rupture and 100% thrombotic occlusion of the proximal LAD s/p PCI/DES  -Currently, chest pain free -Troponin remains > 65, continue to cycle until down trending -ASA and Brilinta for at least 12 months - due to insurance issues, may have to change to plavix as out pt. -Post  Cath H/H and renal function stable -Cardiac rehab  2. Acute systolic CHF/ICM: -He  does not appear grossly volume up EF 30% - Goal is to start low dose coreg, Lisinopril and  if possible spironolactone before discharge.   Monitor in here tonight. - Life vest referal sent by cardio- as EF is very low.  3. HTN: -Stable  4. HLD: -LDL 114, goal < 70 -Lipitor  5. Hyperglycemia: -A1c is 6.0  6. Leukocytosis: -No obvious signs of infection -Likely inflammatory  -Monitor   7. Dispo: -Ambulate and likely d./c tomorrow.  Due to No insurance, he will need assistance regarding his meds. Case manager helping with that.   All the records are reviewed and case discussed with Care Management/Social Workerr. Management plans discussed with the patient, family and they are in agreement.  CODE STATUS: Full.  TOTAL TIME TAKING CARE OF THIS PATIENT: 35 minutes.  Seen with help of spanish translator and in presence of pt's son.  POSSIBLE D/C IN 1-2 DAYS, DEPENDING ON CLINICAL CONDITION.   Vaughan Basta M.D on 01/27/2017   Between 7am to 6pm - Pager - [phone]  After 6pm go to [email or web address] - password [email or web address] [facility]  Office  [phone]  CC: Primary care physician; No primary care provider on file.  Note: This dictation was prepared with Dragon dictation along with smaller phrase technology. Any transcriptional errors that result from this process are unintentional.

## 2017-01-27 NOTE — Care Management (Signed)
Georga Kaufmann contacted CM about lease for [employer] for 30 days.  he will process the referral. Notified Zoll. Zoll Vest will be fitted 11/28 afternoon around 3p when wife can be present for instruction and plan for extended time for the interpretor Patient's children age 59 and 32 are covered by "choice."

## 2017-01-27 NOTE — Plan of Care (Signed)
  Progressing Education: Knowledge of General Education information will improve 01/27/2017 1048 - Progressing by Rolley Sims, RN Clinical Measurements: Will remain free from infection 01/27/2017 1048 - Progressing by Rolley Sims, RN Respiratory complications will improve 01/27/2017 1048 - Progressing by Rolley Sims, RN Cardiovascular complication will be avoided 01/27/2017 1048 - Progressing by Rolley Sims, RN Note Pt resting in room comfortably, no complaints of chest pain. Family updated.

## 2017-01-28 ENCOUNTER — Telehealth: Payer: Self-pay | Admitting: Internal Medicine

## 2017-01-28 DIAGNOSIS — E782 Mixed hyperlipidemia: Secondary | ICD-10-CM

## 2017-01-28 DIAGNOSIS — E1159 Type 2 diabetes mellitus with other circulatory complications: Secondary | ICD-10-CM

## 2017-01-28 LAB — CBC
HCT: 43.7 % (ref 40.0–52.0)
Hemoglobin: 14.8 g/dL (ref 13.0–18.0)
MCH: 30.4 pg (ref 26.0–34.0)
MCHC: 33.9 g/dL (ref 32.0–36.0)
MCV: 89.8 fL (ref 80.0–100.0)
PLATELETS: 195 10*3/uL (ref 150–440)
RBC: 4.87 MIL/uL (ref 4.40–5.90)
RDW: 13 % (ref 11.5–14.5)
WBC: 8.5 10*3/uL (ref 3.8–10.6)

## 2017-01-28 LAB — BASIC METABOLIC PANEL
Anion gap: 8 (ref 5–15)
BUN: 15 mg/dL (ref 6–20)
CALCIUM: 8.9 mg/dL (ref 8.9–10.3)
CHLORIDE: 104 mmol/L (ref 101–111)
CO2: 24 mmol/L (ref 22–32)
CREATININE: 0.74 mg/dL (ref 0.61–1.24)
GFR calc non Af Amer: >60 mL/min (ref >60)
Glucose, Bld: 122 mg/dL — ABNORMAL HIGH (ref 65–99)
Potassium: 3.4 mmol/L — ABNORMAL LOW (ref 3.5–5.1)
Sodium: 136 mmol/L (ref 135–145)

## 2017-01-28 LAB — MAGNESIUM: MAGNESIUM: 2 mg/dL (ref 1.7–2.4)

## 2017-01-28 MED ORDER — LISINOPRIL 2.5 MG PO TABS: 2.5000 mg | ORAL_TABLET | Freq: Every day | ORAL | 0 refills | Status: DC

## 2017-01-28 MED ORDER — FUROSEMIDE 20 MG PO TABS: 20.0000 mg | ORAL_TABLET | Freq: Every day | ORAL | 0 refills | Status: DC

## 2017-01-28 MED ORDER — CARVEDILOL 3.125 MG PO TABS: 3.1250 mg | ORAL_TABLET | Freq: Two times a day (BID) | ORAL | 0 refills | Status: DC

## 2017-01-28 MED ORDER — ASPIRIN 81 MG PO CHEW: 81.0000 mg | CHEWABLE_TABLET | Freq: Every day | ORAL | 0 refills | Status: DC

## 2017-01-28 MED ORDER — SPIRONOLACTONE 25 MG PO TABS: 12.5000 mg | ORAL_TABLET | Freq: Every day | ORAL | 0 refills | Status: DC

## 2017-01-28 MED ORDER — TICAGRELOR 90 MG PO TABS
90.0000 mg | ORAL_TABLET | Freq: Two times a day (BID) | ORAL | 0 refills | Status: DC
Start: 2017-01-28 — End: 2017-04-29

## 2017-01-28 MED ORDER — ATORVASTATIN CALCIUM 80 MG PO TABS: 80.0000 mg | ORAL_TABLET | Freq: Every day | ORAL | 0 refills | Status: DC

## 2017-01-28 MED ORDER — POTASSIUM CHLORIDE CRYS ER 20 MEQ PO TBCR: 40.0000 meq | EXTENDED_RELEASE_TABLET | Freq: Once | ORAL | Status: DC

## 2017-01-28 MED ORDER — POTASSIUM CHLORIDE CRYS ER 20 MEQ PO TBCR
40.0000 meq | EXTENDED_RELEASE_TABLET | Freq: Once | ORAL | Status: AC
  Administered 2017-01-28: 40 meq via ORAL
  Filled 2017-01-28: qty 2

## 2017-01-28 MED ORDER — SPIRONOLACTONE 25 MG PO TABS
12.5000 mg | ORAL_TABLET | Freq: Every day | ORAL | Status: DC
  Filled 2017-01-28: qty 1

## 2017-01-28 MED ORDER — POTASSIUM CHLORIDE CRYS ER 10 MEQ PO TBCR: 10.0000 meq | EXTENDED_RELEASE_TABLET | Freq: Every day | ORAL | 0 refills | Status: DC

## 2017-01-28 NOTE — Progress Notes (Signed)
Pt BP was at 113/75. Doctor Eleonore Chiquito was notified and ordered to hold Aldactone and Lasix and give Crvedilol and lisninorpil. Will continue to monitor.

## 2017-01-28 NOTE — Care Management Note (Signed)
Case Management Note  Patient Details  Name: Lucas [name] MRN: [id number] Date of Birth: Oct 21, 1955  Subjective/Objective:                 Have arranged the interpretor for 3pm eduction session with patient and his wife for the life vest.  Pharmacy is obtaining the 30 day free Brilinta from the [employer] as part of a new initiative.  [facility] is providing 2 additional weeks of Brilinta.  Have requested scripts of discharge meds from attending so can obtain patient's discharge meds.   Action/Plan:   Expected Discharge Date:  01/28/17               Expected Discharge Plan:     In-House Referral:     Discharge planning Services  CM Consult, Medication Assistance, [facility]  Post Acute Care Choice:    Choice offered to:  (S) Patient(Spanish interpreter )  DME Arranged:    DME Agency:     HH Arranged:    On[facility] Agency:     Status of Service:  In process, will continue to follow  If discussed at Long Length of Stay Meetings, dates discussed:    Additional Comments:  Katrina Stack, RN 01/28/2017, 9:36 AM

## 2017-01-28 NOTE — Progress Notes (Signed)
Progress Note  Patient Name: Lucas [name] Date of Encounter: 01/28/2017  Primary Cardiologist: New to [facility] - consult by End   Subjective   No chest pain or SOB. Has ambulated in the room without issues. Renal function normal. Potassium low at 3.4 this morning. Hospital medical interpreter used for encounter today.   Inpatient Medications    Scheduled Meds: . aspirin  81 mg Oral Daily  . atorvastatin  80 mg Oral q1800  . carvedilol  3.125 mg Oral BID WC  . enoxaparin (LOVENOX) injection  40 mg Subcutaneous Q24H  . furosemide  20 mg Oral Daily  . insulin aspart  0-5 Units Subcutaneous QHS  . insulin aspart  0-9 Units Subcutaneous TID WC  . lisinopril  2.5 mg Oral Daily  . potassium chloride  10 mEq Oral Daily  . sodium chloride flush  3 mL Intravenous Q12H  . ticagrelor  90 mg Oral BID   Continuous Infusions: . sodium chloride     PRN Meds: sodium chloride, acetaminophen, ondansetron (ZOFRAN) IV, sodium chloride flush   Vital Signs    Vitals:   01/27/17 0938 01/27/17 1745 01/27/17 1935 01/28/17 0349  BP: 100/65 114/79 104/63 104/70  Pulse: 91 85 89 81  Resp: 18 18    Temp:   98.4 F (36.9 C) 98.4 F (36.9 C)  TempSrc:   Oral Oral  SpO2: 96% 95% 94% 98%  Weight:      Height:        Intake/Output Summary (Last 24 hours) at 01/28/2017 7622 Last data filed at 01/28/2017 0122 Gross per 24 hour  Intake 480 ml  Output 600 ml  Net -120 ml   Filed Weights   01/25/17 1725 01/25/17 2000  Weight: 150 lb (68 kg) 146 lb 13.2 oz (66.6 kg)    Telemetry    Sinus rhythm - Personally Reviewed  ECG    n/a - Personally Reviewed  Physical Exam   GEN: No acute distress.   Neck: No JVD. Cardiac: RRR, no murmurs, rubs, or gallops. Right radial cardiac cath site without bleeding, bruising, swelling, erythema, warmth, or TTP. Radial pulse 2+.  Respiratory: Clear to auscultation bilaterally.  GI: Soft, nontender, non-distended.   MS: No edema; No  deformity. Neuro:  Alert and oriented x 3; Nonfocal.  Psych: Normal affect.  Labs    Chemistry Recent Labs  Lab 01/25/17 1730 01/26/17 0255 01/27/17 1116 01/28/17 0455  NA 138 139 137 136  K 3.6 3.7 3.8 3.4*  CL 104 107 104 104  CO2 24 23 22 24   GLUCOSE 196* 149* 243* 122*  BUN 13 13 14 15   CREATININE 0.77 0.78 0.83 0.74  CALCIUM 9.3 8.8* 8.9 8.9  PROT 7.4  --   --   --   ALBUMIN 4.3  --   --   --   AST 230*  --   --   --   ALT 49  --   --   --   ALKPHOS 63  --   --   --   BILITOT 0.7  --   --   --   GFRNONAA >60 >60 >60 >60  GFRAA >60 >60 >60 >60  ANIONGAP 10 9 11 8      Hematology Recent Labs  Lab 01/25/17 1730 01/26/17 0255  WBC 10.9* 12.3*  RBC 5.16 4.98  HGB 16.2 15.3  HCT 46.6 44.4  MCV 90.4 89.3  MCH 31.3 30.8  MCHC 34.6 34.4  RDW 13.1 13.0  PLT 215 214    Cardiac Enzymes Recent Labs  Lab 01/25/17 1730 01/25/17 2316 01/26/17 0255 01/26/17 0853  TROPONINI 37.34* >65.00* >65.00* 46.55*   No results for input(s): TROPIPOC in the last 168 hours.   BNPNo results for input(s): BNP, PROBNP in the last 168 hours.   DDimer No results for input(s): DDIMER in the last 168 hours.   Radiology    No results found.  Cardiac Studies   LHC 01/25/2017: Coronary Findings   Diagnostic  Dominance: Right  Left Main  Vessel is large.  Mid LM to Dist LM lesion 20% stenosed  Mid LM to Dist LM lesion is 20% stenosed.  Left Anterior Descending  Vessel is large.  Ost LAD lesion 20% stenosed  Ost LAD lesion is 20% stenosed.  Prox LAD lesion 100% stenosed  Prox LAD lesion is 100% stenosed. Vessel is the culprit lesion. The lesion is type B2 and thrombotic.  Mid LAD lesion 25% stenosed  Mid LAD lesion is 25% stenosed.  First Diagonal Branch  Vessel is moderate in size.  Ost 1st Diag lesion 50% stenosed  Ost 1st Diag lesion is 50% stenosed.  Second Diagonal Branch  Vessel is moderate in size.  Third Diagonal Branch  Vessel is small in size.  Left  Circumflex  Vessel is moderate in size.  First Obtuse Marginal Branch  Vessel is moderate in size.  Second Obtuse Marginal Branch  Vessel is moderate in size.  Third Obtuse Marginal Branch  Vessel is small in size.  Right Coronary Artery  Vessel is large.  Prox RCA lesion 30% stenosed  Prox RCA lesion is 30% stenosed.  Right Posterior Descending Artery  Vessel is moderate in size.  Right Posterior Atrioventricular Branch  Vessel is moderate in size.  Intervention   Prox LAD lesion  Stent  Lesion length: 20 mm. Lesion crossed with guidewire using a WIRE RUNTHROUGH .[id number]. Pre-stent angioplasty was performed using a BALLOON TREK RX [id number]. A drug-eluting stent was successfully placed using a STENT SIERRA 3.00 X 23 MM. Maximum pressure: 16 atm. Stent strut is well apposed. Post-stent angioplasty was performed using a BALLOON [place] [id number]. Maximum pressure: 16 atm.  Post-Intervention Lesion Assessment  The intervention was successful. Pre-interventional TIMI flow is 0. Post-intervention TIMI flow is 3. Treated lesion length: 20 mm. No complications occurred at this lesion.  There is no residual stenosis post intervention.  Wall Motion                    Left Heart   Left Ventricle The left ventricular size is normal. There is moderate to severe left ventricular systolic dysfunction. The left ventricular ejection fraction is 35-45% by visual estimate. There are LV function abnormalities due to segmental dysfunction.  Aortic Valve There is no aortic valve stenosis.  Coronary Diagrams   Diagnostic Diagram       Post-Intervention Diagram         Recommendations: 1. Admit to ICU; patient will need to remain hospitalized at least 48 hours, given large anterior MI. 2. Dual antiplatelet therapy with aspirin and ticagrelor for at least 12 months, ideally longer. 3. Aggressive secondary prevention, including high-intensity statin therapy. 4. Obtain  transthoracic echocardiogram to better assess LVEF. If LVEF < 35%, I recommend LifeVest on discharge given large anterior MI. 5. Gentle diuresis, given elevated LVEDP. 6. Initiate carvedilol 3.125 mg BID with uptitration as tolerated. ACEI/ARB +/- aldosterone antagonist should be added prior to discharge based on renal function.  TTE 01/26/2017: Study Conclusions  - Left ventricle: The cavity size was normal. There was moderate concentric hypertrophy. Systolic function was moderately to severely reduced. The estimated ejection fraction was in the range of 30% to 35%. There is severe hypokinesis of the anteroseptal, anterior, and apical myocardium. Doppler parameters are consistent with abnormal left ventricular relaxation (grade 1 diastolic dysfunction). - Mitral valve: There was mild regurgitation.   Patient Profile     61 y.o. male with no priorhistorywho [facility] with anterior STEMI.  Assessment & Plan    1. Anterior ST elevation MI: -Late presenting anterior STEMI with acute plaque rupture and 100% thrombotic occlusion of the proximal LAD s/p PCI/DES as detailed above -Currently,chest pain free -Troponin peaked at > 65, now down trending -ASA and Brilinta for at least 12 months -He does not have insurance and will likely need to transition to Plavix after his 30 free days of Brilinta. This can be done in the office at hospital follow up in an effort to avoid confusion -He has a Brilinta card, will check on samples  -Post cath H/H and renal function stable -Cardiac rehab  2. Acute systolic CHF/ICM: -He does not appear grossly volume up -TTE showed EF 30-35% as detailed above -Continue lisinopril 2.5 mg daily, BP precludes titration at this time -Continue Coreg 3.125 mg bid, BP precludes titration at this time -Continue Lasix 20 mg daily with KCl 10 mEq daily -Start spironolactone 12.5 mg daily given cardiomyopathy  -Patient has agreed  to [employer], given his financial picture, [facility] has agreed to cover the first 30 days and [employer] will cover the next 1-2 months while we wait for repeat echo in 3 months -If EF remains less than 35% he will need EP evaluation for ICD  3. HTN: -Stable  4. HLD: -LDL 114, goal < 70 -Lipitor  5. Hyperglycemia: -A1c 6.0 -Needs outpatient follow up  6. Leukocytosis: -No obvious signs of infection -Likely inflammatory  -Check cbc this morning  7. Hypokalemia: -Replete to goal > 4.0 -Check magnesium with recommendation to replete to goal > 2.0  8. Dispo: -Await LifeVest fitting today at 3 PM -Ambulate in the hallway   For questions or updates, please contact [place] Please consult [email or web address] for contact info under Cardiology/STEMI.    Signed, Christell Faith, PA-C [place] Pager: [phone] 01/28/2017, 7:22 AM   Attending Note Patient seen and examined, agree with detailed note above,  Patient presentation and plan discussed on rounds.   EKG lab work, chest x-ray, echocardiogram reviewed independently by myself  Interpreter has assisted with evaluation today Patient reports no chest pain or shortness of breath Has not ambulated outside his room Having numbness tingling left arm down into his second third fourth fingers since his MI  On physical exam no JVD, lungs clear to auscultation bilaterally, heart sounds regular normal S1-S2 no murmurs appreciated, abdomen soft nontender, no significant lower extremity edema  Lab work reviewed showing stable CBC, BMP  A/P: Anterior wall STEMI Discuss his medications, importance of staying on his low-dose aspirin with Brilinta Samples provided, coupon card He will get all of his other medications for medical management [employer] coming to fit the patient 3 PM talked with case manager, [facility] will help fund for first month then [employer] will help for additional months Repeat echocardiogram in 2-3 months time All  questions answered  Diabetes Diet controlled, discussed following strict diet  Hyperlipidemia On high-dose statin  Will require follow-up in clinic  next several weeks Consider cardiac rehab if able to afford Greater than 50% was spent in counseling and coordination of care with patient Total encounter time 35 minutes or more   Signed: Esmond Plants  M.D., Ph.D. [facility]

## 2017-01-28 NOTE — Progress Notes (Signed)
Lucas [name] was called to assist with life vest instruction and discharge. Pt d/c to home today.  IV removed intact.  D/c paperwork printed and reviewed w/pt.  All medication questions and concerns reviewed and pt states understanding.  All Rx's given to patient. Pt requested to walk out for d/c.

## 2017-01-28 NOTE — Care Management (Signed)
[facility] was able to fill medications after all as the system has come back up.  Spoke with patient's son regarding completion of the applications for [facility].  Explained to wife and son that Larence Penning is paying for the life Vest for 30 days, then [employer] will cover for the next two months if needed.

## 2017-01-28 NOTE — Progress Notes (Signed)
    16 days worth of Brilinta 90 mg bid samples were provided from the office along with a Brilinta card for the patient to obtain the first 30 days free. The importance of DAPT was explained in detail to the patient. The hospital medical interpreter was used for this encounter. He will be transitioned from Brilinta to Plavix after the first 4-6 weeks of therapy. He understands this. Await LifeVest fitting later this PM. Ambulate.

## 2017-01-28 NOTE — Care Management Note (Signed)
Case Management Note  Patient Details  Name: Lucas [name] MRN: [id number] Date of Birth: Jul 16, 1955  Subjective/Objective:                 Completed MATCH Referral as informed that computer system down at [facility] and unable to fill medications.  There was discussion as to whether the interpretor is needed for [employer] instruction as the representative speaks spanish.  CM advised to follow [facility] policy and use the interpretor.  Drug representative provided an additional 8 days of Brilinta.    Action/Plan:   Expected Discharge Date:  01/28/17               Expected Discharge Plan:     In-House Referral:     Discharge planning Services  CM Consult, Medication Assistance, [facility]  Post Acute Care Choice:    Choice offered to:  (S) Patient(Spanish interpreter )  DME Arranged:    DME Agency:     HH Arranged:    [place] Agency:     Status of Service:  In process, will continue to follow  If discussed at Long Length of Stay Meetings, dates discussed:    Additional Comments:  Katrina Stack, RN 01/28/2017, 3:57 PM

## 2017-01-28 NOTE — Progress Notes (Signed)
Interpreter Helyn App called in to interpret about the plan today and reviewed med with the pt.

## 2017-01-28 NOTE — Progress Notes (Signed)
Rounded on patient. ?Patient admitted with dx of STEMI with DES to proximal LAD.  Patient with acute systolic CHF/ICM.  Echo on 01/26/2017 revealed EF of 30 - 35%.  Patient will have Life Vest for first three months, then repeat echo to evaluate need for ICD.  Patient has hx of HTN, HLD, and Hyperglycemia.  Patient is Hispanic Speaking Only.    CHF Education:  Educational session with patient, son, and wife was present for portion of education.   Interpreter, Kennyth Lose, present and assisted this RN with education. Adult son speaks and reads [place].     ? Provided patient with "Living Better with Heart Failure" packet. Briefly reviewed definition of heart failure and signs and symptoms of an exacerbation. Discussed the meaning of EF with patient.?Information about HF provided in Spanish.   ? *Reviewed importance of and reason behind checking weight daily in the AM, after using the bathroom, but before getting dressed. Patient has scales.?Patient stated he has scales at home.    Reviewed the following information with patient:  *Discussed when to call the Dr= weight gain of >3lb overnight of 5lb in a week,  *Discussed yellow zone= call MD: weight gain of >3lb overnight of 5lb in a week, increased swelling, increased SOB when lying down, chest discomfort, dizziness, increased fatigue. ? *Red Zone= call 911: struggle to breath, fainting or near fainting, significant chest pain. ? ? *Reviewed low sodium diet-provided handout of recommended and not recommended foods. Reviewed reading labels with patient. Discussed fluid intake with patient as well. Patient not currently on a fluid restriction, but advised no more than 8-8 ounces glass of fluids per day.  Patient has already been educated by the Dietitian on [place].    *Instructed patient to take medications as prescribed for heart failure. Explained briefly why pt is on the medications (either make you feel better, live longer or keep you out of  the hospital) and discussed monitoring and side effects.  ? *Discussed exercise. Encouraged patient to be as active as he possibly can.  Patient is unable to participate in Cardiac Rehab due to no payor source.  Patient is planning to return to working in [employer] he worked prior to admission.   ? ? *Smoking Cessation - patient informed this RN that he smoked many years ago as a young adult for about a year.  Has not smoked since.  ? ? Explained the role of theHeart Failure Clinic.  Appointment scheduled for 02/02/2017 at 10:40 a.m.  Explained to patient the HF Clinic does not replace his cardiologist or PCP, but serves as an additional resource in helping him manage his HF.    Patient and son thanked me for my time in reviewing this information with him. ?  Roanna Epley, RN, BSN, [facility]? [facility]  Cardiovascular &?Pulmonary Nurse Navigator  Direct Line: [phone]  Department Phone #: [phone] Fax: [phone]? Email Address: [email or web address]

## 2017-01-28 NOTE — Plan of Care (Signed)
Radial access site without complications.

## 2017-01-28 NOTE — Telephone Encounter (Signed)
TCM [facility] for STEMI needs 1 wk with End  Schedule 02/04/17 at 3

## 2017-01-29 NOTE — Telephone Encounter (Signed)
Patient contacted regarding discharge from [facility] on 11/28.   Patient understands to follow up with provider ? On 02/04/17 at 3 pm at [facility].  Patient understands discharge instructions? Yes Patient understands medications and regiment? Yes Patient understands to bring all medications to this visit? Yes  Called patient using Ruth ID # [id number].

## 2017-02-02 ENCOUNTER — Ambulatory Visit: Payer: Self-pay | Attending: Family | Admitting: Family

## 2017-02-02 ENCOUNTER — Encounter: Payer: Self-pay | Admitting: Family

## 2017-02-02 VITALS — BP 116/72 | HR 73 | Resp 18 | Ht 60.0 in | Wt 147.0 lb

## 2017-02-02 DIAGNOSIS — I110 Hypertensive heart disease with heart failure: Secondary | ICD-10-CM | POA: Insufficient documentation

## 2017-02-02 DIAGNOSIS — Z79899 Other long term (current) drug therapy: Secondary | ICD-10-CM | POA: Insufficient documentation

## 2017-02-02 DIAGNOSIS — I2102 ST elevation (STEMI) myocardial infarction involving left anterior descending coronary artery: Secondary | ICD-10-CM

## 2017-02-02 DIAGNOSIS — I252 Old myocardial infarction: Secondary | ICD-10-CM | POA: Insufficient documentation

## 2017-02-02 DIAGNOSIS — Z7982 Long term (current) use of aspirin: Secondary | ICD-10-CM | POA: Insufficient documentation

## 2017-02-02 DIAGNOSIS — Z7902 Long term (current) use of antithrombotics/antiplatelets: Secondary | ICD-10-CM | POA: Insufficient documentation

## 2017-02-02 DIAGNOSIS — I5022 Chronic systolic (congestive) heart failure: Secondary | ICD-10-CM | POA: Insufficient documentation

## 2017-02-02 DIAGNOSIS — M791 Myalgia, unspecified site: Secondary | ICD-10-CM | POA: Insufficient documentation

## 2017-02-02 DIAGNOSIS — I10 Essential (primary) hypertension: Secondary | ICD-10-CM | POA: Insufficient documentation

## 2017-02-02 NOTE — Progress Notes (Signed)
Patient ID: Lucas [name], male    DOB: 08-Nov-1955, 61 y.o.   MRN: [id number]  HPI  [name] is a 61 y/o male with a history of STEMI, HTN and heart failure.  Echo report done 01/26/17 reviewed and shows an EF of 30-35% along with mild MR. Cardiac catheterization done 01/25/17 showed mild to moderate non-obstructive disease involving the mid/distal LAD and RCA. Moderately elevated left ventricular filling pressure. 100% thrombotic occlusion of the proximal LAD. DES was successfully placed.   Admitted 01/25/17 due to a late presenting STEMI. Had acute plaque rupture and 100% thrombotic occlusion with successful DES. Troponin peaked at >65. Aspirin and brilinta for at least 12 months. Lifevest applied with decision of a repeat echo in 3 months. Discharged home after 3 days.   He presents today for his initial visit with a chief complaint of mild fatigue upon moderate exertion. He describes this as being present for about a month. He has associated chest tightness, shortness of breath and dizziness. He denies any edema, palpitations, weakness, difficulty sleeping or weight gain.   Past Medical History:  Diagnosis Date  . CHF (congestive heart failure) ([place])   . Hypertension   . STEMI (ST elevation myocardial infarction) [facility])    Past Surgical History:  Procedure Laterality Date  . APPENDECTOMY    . CARDIAC CATHETERIZATION    . CORONARY ANGIOPLASTY    . CORONARY/GRAFT ACUTE MI REVASCULARIZATION N/A 01/25/2017   Procedure: Coronary/Graft Acute MI Revascularization;  Surgeon: Nelva Bush, MD;  Location: [place] [facility];  Service: Cardiovascular;  Laterality: N/A;  . LEFT HEART CATH AND CORONARY ANGIOGRAPHY N/A 01/25/2017   Procedure: LEFT HEART CATH AND CORONARY ANGIOGRAPHY;  Surgeon: Nelva Bush, MD;  Location: [place] [facility];  Service: Cardiovascular;  Laterality: N/A;   Family History  Problem Relation Age of Onset  . Heart disease Father        s/p  pacemaker  . Arrhythmia Father   . Asthma Father   . Asthma Mother    Social History   Tobacco Use  . Smoking status: Never Smoker  . Smokeless tobacco: Never Used  Substance Use Topics  . Alcohol use: No    Frequency: Never   No Known Allergies Prior to Admission medications   Medication Sig Start Date End Date Taking? Authorizing Provider  aspirin 81 MG chewable tablet Chew 1 tablet (81 mg total) by mouth daily. 01/29/17  Yes Vaughan Basta, MD  carvedilol (COREG) 3.125 MG tablet Take 1 tablet (3.125 mg total) by mouth 2 (two) times daily with a meal. 01/28/17  Yes Vaughan Basta, MD  furosemide (LASIX) 20 MG tablet Take 1 tablet (20 mg total) by mouth daily. 01/29/17  Yes Vaughan Basta, MD  lisinopril (PRINIVIL,ZESTRIL) 2.5 MG tablet Take 1 tablet (2.5 mg total) by mouth daily. 01/29/17  Yes Vaughan Basta, MD  potassium chloride (K-DUR,KLOR-CON) 10 MEQ tablet Take 1 tablet (10 mEq total) by mouth daily. 01/29/17  Yes Vaughan Basta, MD  spironolactone (ALDACTONE) 25 MG tablet Take 0.5 tablets (12.5 mg total) by mouth daily. 01/29/17  Yes Vaughan Basta, MD  ticagrelor (BRILINTA) 90 MG TABS tablet Take 1 tablet (90 mg total) by mouth 2 (two) times daily. 01/28/17  Yes Vaughan Basta, MD  atorvastatin (LIPITOR) 80 MG tablet Take 1 tablet (80 mg total) by mouth daily at 6 PM. Patient not taking: Reported on 02/02/2017 01/28/17   Vaughan Basta, MD    Review of Systems  Constitutional: Positive for fatigue (  minimal). Negative for appetite change.  HENT: Negative for congestion, postnasal drip and sore throat.   Eyes: Negative.   Respiratory: Positive for chest tightness (over left chest wall) and shortness of breath.   Cardiovascular: Negative for chest pain, palpitations and leg swelling.  Gastrointestinal: Negative for abdominal distention and abdominal pain.  Endocrine: Negative.   Genitourinary: Negative.    Musculoskeletal: Negative for [name] pain and neck pain.  Skin: Negative.   Allergic/Immunologic: Negative.   Neurological: Positive for light-headedness (intermittently). Negative for dizziness and weakness.  Hematological: Negative for adenopathy. Does not bruise/bleed easily.  Psychiatric/Behavioral: Negative for dysphoric mood and sleep disturbance (sleeping on 2 pillows). The patient is not nervous/anxious.    Vitals:   02/02/17 1108  BP: 116/72  Pulse: 73  Resp: 18  SpO2: 98%  Weight: 147 lb (66.7 kg)  Height: 5' (1.524 m)   Wt Readings from Last 3 Encounters:  02/02/17 147 lb (66.7 kg)  01/25/17 146 lb 13.2 oz (66.6 kg)   Lab Results  Component Value Date   CREATININE 0.74 01/28/2017   CREATININE 0.83 01/27/2017   CREATININE 0.78 01/26/2017    Physical Exam  Constitutional: He is oriented to person, place, and time. He appears well-developed and well-nourished.  HENT:  Head: Normocephalic and atraumatic.  Neck: Normal range of motion. Neck supple. No JVD present.  Cardiovascular: Normal rate and regular rhythm.  Pulmonary/Chest: Effort normal. No respiratory distress. He has no wheezes. He has no rales.  Abdominal: Soft. He exhibits no distension. There is no tenderness.  Musculoskeletal: He exhibits no edema or tenderness.  Neurological: He is alert and oriented to person, place, and time.  Skin: Skin is warm and dry.  Psychiatric: He has a normal mood and affect. His behavior is normal. Thought content normal.  Nursing note and vitals reviewed.  Assessment & Plan:  1: Chronic heart failure with reduced ejection fraction- - NYHA class II - euvolemic today - weighing daily and weight chart reviewed. Instructed to call for an overnight weight gain of >2 pounds or a weekly weight gain of >5 pounds - not adding salt and he and his wife have been reading food labels. Discussed the importance of closely following a 2000mg  sodium diet.  - could consider changing his  lisinopril to entresto if his BP would allow - could also consider titrating up carvedilol in the future - not wearing the lifevest as he says that it was very uncomfortable and put pressure on the left upper chest wall. He will speak with cardiologist about this to see if a new fitting needs to be done  2: STEMI- - aspirin and brilinta for at least 12 months; transition to plavix due to lack of insurance - sees cardiologist (End) 02/04/17  3: HTN-  - BP looks good today - BMP from 01/28/17 reviewed and showed sodium 136, potassium 3.4 and GFR >60  4: Myalgia- - was put on 80mg  of atorvastatin during the hospitalization - patient has since stopped it because he says that his muscles and joints all over hurt - discussed the benefits of treatment and he's agreeable to trying it again. Will re-start it but at 40mg  dose (1/2 tablet) with the goal to try and get it [name] to 80mg . Could also cut it [name] to 20mg  if needed for symptoms and titrate it up slowly.   Medication list was reviewed.   Return in 1 month or sooner for any questions/problems before then.  Hospital medical interpreter was used  throughout the entire visit.

## 2017-02-02 NOTE — Patient Instructions (Addendum)
Continue weighing daily and call for an overnight weight gain of > 2 pounds or a weekly weight gain of >5 pounds.  Contnue pesandose diariamente, y llamenos si gana mas de 2 libras, o si gana mas ded 5 libras por semana.  Cut the lipitor in 1/2 and take a 1/2 tablet at bedtime to see if you can tolerate it.   Corte la pastilla de Lipitor Atorvastatin a la mitad y observe como se siente y si Art gallery manager.

## 2017-02-03 ENCOUNTER — Encounter: Payer: Self-pay | Admitting: Internal Medicine

## 2017-02-03 ENCOUNTER — Encounter: Payer: Self-pay | Admitting: *Deleted

## 2017-02-03 ENCOUNTER — Other Ambulatory Visit
Admission: RE | Admit: 2017-02-03 | Discharge: 2017-02-03 | Disposition: A | Discharge status: Home / Self Care | Payer: Self-pay | Source: Ambulatory Visit | Attending: Internal Medicine | Admitting: Internal Medicine

## 2017-02-03 ENCOUNTER — Ambulatory Visit (INDEPENDENT_AMBULATORY_CARE_PROVIDER_SITE_OTHER): Payer: Self-pay | Admitting: Internal Medicine

## 2017-02-03 VITALS — BP 110/70 | HR 71 | Ht 60.0 in | Wt 144.8 lb

## 2017-02-03 DIAGNOSIS — I255 Ischemic cardiomyopathy: Secondary | ICD-10-CM

## 2017-02-03 DIAGNOSIS — E785 Hyperlipidemia, unspecified: Secondary | ICD-10-CM

## 2017-02-03 DIAGNOSIS — I5022 Chronic systolic (congestive) heart failure: Secondary | ICD-10-CM

## 2017-02-03 DIAGNOSIS — I2510 Atherosclerotic heart disease of native coronary artery without angina pectoris: Secondary | ICD-10-CM

## 2017-02-03 DIAGNOSIS — I10 Essential (primary) hypertension: Secondary | ICD-10-CM | POA: Insufficient documentation

## 2017-02-03 DIAGNOSIS — I2102 ST elevation (STEMI) myocardial infarction involving left anterior descending coronary artery: Secondary | ICD-10-CM | POA: Insufficient documentation

## 2017-02-03 LAB — BASIC METABOLIC PANEL
ANION GAP: 11 (ref 5–15)
BUN: 19 mg/dL (ref 6–20)
CALCIUM: 9.3 mg/dL (ref 8.9–10.3)
CHLORIDE: 101 mmol/L (ref 101–111)
CO2: 25 mmol/L (ref 22–32)
Creatinine, Ser: 0.99 mg/dL (ref 0.61–1.24)
GFR calc non Af Amer: >60 mL/min (ref >60)
Glucose, Bld: 123 mg/dL — ABNORMAL HIGH (ref 65–99)
Potassium: 4.4 mmol/L (ref 3.5–5.1)
Sodium: 137 mmol/L (ref 135–145)

## 2017-02-03 LAB — CK TOTAL AND CKMB (NOT AT ARMC)
CK TOTAL: 54 U/L (ref 49–397)
CK, MB: 1 ng/mL (ref 0.5–5.0)
RELATIVE INDEX: INVALID (ref 0.0–2.5)

## 2017-02-03 MED ORDER — LISINOPRIL 5 MG PO TABS: 5.0000 mg | ORAL_TABLET | Freq: Every day | ORAL | 3 refills | Status: DC

## 2017-02-03 NOTE — Discharge Summary (Signed)
[facility] [place] NAME: Lucas [name]    MR#:  [id number]  DATE OF BIRTH:  1955/05/16  DATE OF ADMISSION:  01/25/2017 ADMITTING PHYSICIAN: Nelva Bush, MD  DATE OF DISCHARGE: 01/28/2017  5:48 PM  PRIMARY CARE PHYSICIAN: Patient, [name]    ADMISSION DIAGNOSIS:  ST elevation myocardial infarction (STEMI), unspecified artery (HCC) [I21.3] STEMI involving left anterior descending coronary artery (HCC) [I21.02]  DISCHARGE DIAGNOSIS:  Active Problems:   STEMI involving left anterior descending coronary artery ([place])   SECONDARY DIAGNOSIS:   Past Medical History:  Diagnosis Date  . CHF (congestive heart failure) ([place])   . Hypertension   . STEMI (ST elevation myocardial infarction) [facility])     HOSPITAL COURSE:   1.Anterior ST elevation MI: -Late presenting anterior STEMI with acute plaque rupture and 100% thrombotic occlusion of the proximal LAD s/p PCI/DES  -Currently,chest pain free -Troponin remains > 65, continue to cycle until down trending -ASA and Brilinta for at least 12 months - due to insurance issues, may have to change to plavix as out pt. -Post Cath H/H and renal function stable -Cardiac rehab  2. Acute systolic CHF/ICM: -He does not appear grossly volume up EF 30% - Goal is to start low dose coreg, Lisinopril and if possible spironolactone before discharge.   Monitor in here tonight. - Life vest referal sent by cardio- as EF is very low.  3. HTN: -Stable  4. HLD: -LDL 114, goal < 70 -Lipitor  5. Hyperglycemia: -A1c is 6.0  6. Leukocytosis: -No obvious signs of infection -Likely inflammatory  -Monitor   DISCHARGE CONDITIONS:   Stable.  CONSULTS OBTAINED:  Treatment Team:  Nelva Bush, MD Wilhelmina Mcardle, MD  DRUG ALLERGIES:  No Known Allergies  DISCHARGE MEDICATIONS:   Allergies as of 01/28/2017   No Known Allergies     Medication List    TAKE these  medications   aspirin 81 MG chewable tablet Chew 1 tablet (81 mg total) by mouth daily.   carvedilol 3.125 MG tablet Commonly known as:  COREG Take 1 tablet (3.125 mg total) by mouth 2 (two) times daily with a meal.   furosemide 20 MG tablet Commonly known as:  LASIX Take 1 tablet (20 mg total) by mouth daily.   potassium chloride 10 MEQ tablet Commonly known as:  K-DUR,KLOR-CON Take 1 tablet (10 mEq total) by mouth daily.   spironolactone 25 MG tablet Commonly known as:  ALDACTONE Take 0.5 tablets (12.5 mg total) by mouth daily.   ticagrelor 90 MG Tabs tablet Commonly known as:  BRILINTA Take 1 tablet (90 mg total) by mouth 2 (two) times daily.        DISCHARGE INSTRUCTIONS:    Follow in cardiology clinic in 1 week.  If you experience worsening of your admission symptoms, develop shortness of breath, life threatening emergency, suicidal or homicidal thoughts you must seek medical attention immediately by calling 911 or calling your MD immediately  if symptoms less severe.  You Must read complete instructions/literature along with all the possible adverse reactions/side effects for all the Medicines you take and that have been prescribed to you. Take any new Medicines after you have completely understood and accept all the possible adverse reactions/side effects.   Please note  You were cared for by a hospitalist during your hospital stay. If you have any questions about your discharge medications or the care you received while you were in the hospital after  you are discharged, you can call the unit and asked to speak with the hospitalist on call if the hospitalist that took care of you is not available. Once you are discharged, your primary care physician will handle any further medical issues. Please note that NO REFILLS for any discharge medications will be authorized once you are discharged, as it is imperative that you return to your primary care physician (or establish a  relationship with a primary care physician if you do not have one) for your aftercare needs so that they can reassess your need for medications and monitor your lab values.    Today   CHIEF COMPLAINT:   Chief Complaint  Patient presents with  . Chest Pain    HISTORY OF PRESENT ILLNESS:  Lucas [name]  is a 61 y.o. male with no medical problems presented with chest pain.  History obtained through interpreter.  He states today he was working today and he was lifting up boxes stacking them up over his head and he developed pain in his arms.  He went to eat.  His entire back and chest was hurting.  Then he developed some hurting in his head.  It felt like he was going to fall and he was very weak.  Pain described as 10 out of 10 in intensity.  Difficult to ascertain the quality of the pain through the interpreter.  No complaints of shortness of breath, nausea or diaphoresis.  The patient states that he had the pain all day yesterday and he thought it would go away.  The patient was brought to the cardiac Cath Lab for a STEMI and had a stent in the LAD.  He states his back pain is a little less than 8 out of 10 in intensity and his chest pain is down to 4 out of 10 intensity.  Hospitalist services were contacted for admission.      VITAL SIGNS:  Blood pressure 113/75, pulse 80, temperature 98.1 F (36.7 C), resp. rate 18, height 5\' 8"  (1.727 m), weight 66.6 kg (146 lb 13.2 oz), SpO2 97 %.  I/O:  No intake or output data in the 24 hours ending 02/03/17 2210  PHYSICAL EXAMINATION:  GENERAL:  61 y.o.-year-old patient lying in the bed with no acute distress.  EYES: Pupils equal, round, reactive to light and accommodation. No scleral icterus. Extraocular muscles intact.  HEENT: Head atraumatic, normocephalic. Oropharynx and nasopharynx clear.  NECK:  Supple, no jugular venous distention. No thyroid enlargement, no tenderness.  LUNGS: Normal breath sounds bilaterally, no wheezing,  rales,rhonchi or crepitation. No use of accessory muscles of respiration.  CARDIOVASCULAR: S1, S2 normal. No murmurs, rubs, or gallops.  ABDOMEN: Soft, non-tender, non-distended. Bowel sounds present. No organomegaly or mass.  EXTREMITIES: No pedal edema, cyanosis, or clubbing.  NEUROLOGIC: Cranial nerves II through XII are intact. Muscle strength 5/5 in all extremities. Sensation intact. Gait not checked.  PSYCHIATRIC: The patient is alert and oriented x 3.  SKIN: No obvious rash, lesion, or ulcer.   DATA REVIEW:   CBC Recent Labs  Lab 01/28/17 0826  WBC 8.5  HGB 14.8  HCT 43.7  PLT 195    Chemistries  Recent Labs  Lab 01/28/17 0826 02/03/17 1622  NA  --  137  K  --  4.4  CL  --  101  CO2  --  25  GLUCOSE  --  123*  BUN  --  19  CREATININE  --  0.99  CALCIUM  --  9.3  MG 2.0  --     Cardiac Enzymes No results for input(s): TROPONINI in the last 168 hours.  Microbiology Results  Results for orders placed or performed during the hospital encounter of 01/25/17  MRSA PCR Screening     Status: None   Collection Time: 01/25/17 10:34 PM  Result Value Ref Range Status   MRSA by PCR NEGATIVE NEGATIVE Final    Comment:        The GeneXpert MRSA Assay (FDA approved for NASAL specimens only), is one component of a comprehensive MRSA colonization surveillance program. It is not intended to diagnose MRSA infection nor to guide or monitor treatment for MRSA infections.     RADIOLOGY:  No results found.  EKG:   Orders placed or performed during the hospital encounter of 01/25/17  . EKG 12-Lead  . EKG 12-Lead  . ED EKG  . ED EKG  . EKG 12-Lead  . EKG 12-Lead  . EKG 12-Lead  . EKG 12-Lead  . EKG 12-Lead  . EKG 12-Lead      Management plans discussed with the patient, family and they are in agreement.  CODE STATUS:  Code Status History    Date Active Date Inactive Code Status Order ID Comments User Context   01/25/2017 21:04 01/28/2017 20:53 Full Code  [id number]  End, Harrell Gave, MD Inpatient      TOTAL TIME TAKING CARE OF THIS PATIENT: 35 minutes.    Vaughan Basta M.D on 02/03/2017 at 10:10 PM  Between 7am to 6pm - Pager - [phone]  After 6pm go to [email or web address] - password [email or web address] [facility]  Office  [phone]  CC: Primary care physician; Patient, [name]   Note: This dictation was prepared with Dragon dictation along with smaller phrase technology. Any transcriptional errors that result from this process are unintentional.

## 2017-02-03 NOTE — Progress Notes (Signed)
Follow-up Outpatient Visit Date: 02/03/2017  Primary Care Provider: Patient, [name] No address on file  Chief Complaint: Follow-up coronary artery disease  HPI:  [name] is a 61 y.o. year-old male with history of coronary artery disease status post late presenting anterior STEMI on 01/25/17, ischemic cardiomyopathy, and borderline diabetes mellitus, who presents for follow-up of coronary artery disease.  [name] presented to [facility] on 01/25/17 with a 3-day history of chest and right shoulder pain.  EKG showed anterior ST elevation with septal Q waves.  Given ongoing chest pain, he was taken for emergent left heart catheterization, which revealed occluded proximal LAD.  This was successfully treated with a single drug-eluting stent.  LVEF was reduced at 30-35%.  He was discharged home on aspirin and ticagrelor, as well as carvedilol, lisinopril, and spironolactone.  He has been tolerating the medications well.  He denies chest pain, shortness of breath, palpitations, lightheadedness, orthopnea, PND, and edema.  [name] was discharged with a LifeVest.  However, after wearing this only 2 days, he was unable to tolerate it due to a constricted feeling in his chest and accompanying anxiety.  He did not receive any shocks while wearing the vest.  The patient notes subjective fevers and myalgias that he attributes it to atorvastatin.  Dose was decreased from 80 to 40 mg daily by Darylene Price, NP, yesterday.  Mr. Jeralene Huff feels as though his myalgias are already improving.  --------------------------------------------------------------------------------------------------  Cardiovascular History & Procedures: Cardiovascular Problems:  Coronary artery disease with a late presenting anterior STEMI (01/2017)  Ischemic cardiomyopathy  Risk Factors:  Known coronary artery disease, male gender, and age greater than 23  Cath/PCI:  LHC/PCI (01/25/17): LMCA with 20% distal stenosis.   LAD with 20% ostial disease followed by thrombotic occlusion of the proximal vessel.  25% mid LAD disease and 50% ostial D1 stenosis present.  LCx normal.  RCA with 30% proximal stenosis.  Successful PCI to the proximal LAD with placement of a Xience Sierra 3.0 x 23 mm drug-eluting stent postdilated with a 3.5 mm Dodson balloon at high pressure.  CV Surgery:  None  EP Procedures and Devices:  None  Non-Invasive Evaluation(s):  TTE (01/26/17): Normal LV size with moderate LVH.  LVEF 30-35% with severe anterior, anteroseptal, and apical hypokinesis.  Grade 1 diastolic dysfunction.  Mild MR.  Normal RV size and function.  Recent CV Pertinent Labs: Lab Results  Component Value Date   CHOL 184 01/25/2017   HDL 44 01/25/2017   LDLCALC 114 (H) 01/25/2017   TRIG 132 01/25/2017   CHOLHDL 4.2 01/25/2017   INR 1.05 01/25/2017   K 4.4 02/03/2017   MG 2.0 01/28/2017   BUN 19 02/03/2017   CREATININE 0.99 02/03/2017    Past medical and surgical history were reviewed and updated in EPIC.  Current Meds  Medication Sig  . aspirin 81 MG chewable tablet Chew 1 tablet (81 mg total) by mouth daily.  Marland Kitchen atorvastatin (LIPITOR) 80 MG tablet Take 40 mg by mouth daily.  . carvedilol (COREG) 3.125 MG tablet Take 1 tablet (3.125 mg total) by mouth 2 (two) times daily with a meal.  . furosemide (LASIX) 20 MG tablet Take 1 tablet (20 mg total) by mouth daily.  . potassium chloride (K-DUR,KLOR-CON) 10 MEQ tablet Take 1 tablet (10 mEq total) by mouth daily.  Marland Kitchen spironolactone (ALDACTONE) 25 MG tablet Take 0.5 tablets (12.5 mg total) by mouth daily.  . ticagrelor (BRILINTA) 90 MG TABS tablet Take 1  tablet (90 mg total) by mouth 2 (two) times daily.  . [DISCONTINUED] lisinopril (PRINIVIL,ZESTRIL) 2.5 MG tablet Take 1 tablet (2.5 mg total) by mouth daily.    Allergies: Patient has no known allergies.  Social History   Socioeconomic History  . Marital status: Married    Spouse name: Not on file  . Number of  children: Not on file  . Years of education: Not on file  . Highest education level: Not on file  Social Needs  . Financial resource strain: Not on file  . Food insecurity - worry: Not on file  . Food insecurity - inability: Not on file  . Transportation needs - medical: Not on file  . Transportation needs - non-medical: Not on file  Occupational History  . Not on file  Tobacco Use  . Smoking status: Never Smoker  . Smokeless tobacco: Never Used  Substance and Sexual Activity  . Alcohol use: No    Frequency: Never  . Drug use: No  . Sexual activity: Yes    Birth control/protection: Coitus interruptus  Other Topics Concern  . Not on file  Social History Narrative  . Not on file    Family History  Problem Relation Age of Onset  . Heart disease Father        s/p pacemaker  . Arrhythmia Father   . Asthma Father   . Asthma Mother     Review of Systems: A 12-system review of systems was performed and was negative except as noted in the HPI.  --------------------------------------------------------------------------------------------------  Physical Exam: BP 110/70 (BP Location: Left Arm, Patient Position: Sitting, Cuff Size: Normal)   Pulse 71   Ht 5' (1.524 m)   Wt 144 lb 12 oz (65.7 kg)   BMI 28.27 kg/m   General: Overweight man, seated comfortably in the exam room.  He is accompanied by his wife and son. HEENT: No conjunctival pallor or scleral icterus. Moist mucous membranes.  OP clear. Neck: Supple without lymphadenopathy, thyromegaly, JVD, or HJR. Lungs: Normal work of breathing. Clear to auscultation bilaterally without wheezes or crackles. Heart: Regular rate and rhythm without murmurs, rubs, or gallops. Non-displaced PMI. Abd: Bowel sounds present. Soft, NT/ND without hepatosplenomegaly Ext: No lower extremity edema. Radial, PT, and DP pulses are 2+ bilaterally.  Right radial arteriotomy site is well-healed. Skin: Warm and dry without rash.  EKG: Normal  sinus rhythm with low voltage QRS, septal Q waves and 1 mm of ST elevation in V2.  Anterior and lateral T wave inversions are also present.  Lab Results  Component Value Date   WBC 8.5 01/28/2017   HGB 14.8 01/28/2017   HCT 43.7 01/28/2017   MCV 89.8 01/28/2017   PLT 195 01/28/2017    Lab Results  Component Value Date   NA 137 02/03/2017   K 4.4 02/03/2017   CL 101 02/03/2017   CO2 25 02/03/2017   BUN 19 02/03/2017   CREATININE 0.99 02/03/2017   GLUCOSE 123 (H) 02/03/2017   ALT 49 01/25/2017    Lab Results  Component Value Date   CHOL 184 01/25/2017   HDL 44 01/25/2017   LDLCALC 114 (H) 01/25/2017   TRIG 132 01/25/2017   CHOLHDL 4.2 01/25/2017    --------------------------------------------------------------------------------------------------  ASSESSMENT AND PLAN: Coronary artery disease status post STEMI [name] has recovered well from his STEMI and does not report any further chest pain or right shoulder pain.  We will continue aspirin and ticagrelor for 12 months, as well as current  dose of carvedilol.  Atorvastatin was decreased to 40 mg daily yesterday by Darylene Price, NP, due to myalgias.  I think it is reasonable to continue with this.  We will plan to repeat a lipid panel in 2-3 months.  I think it is reasonable for [name] to remain out of work for 4 weeks from the time of his PCI.  Cardiac rehab referral has already been placed.  Chronic systolic heart failure secondary to ischemic cardiomyopathy [name] appears euvolemic and well compensated with NYHA class II symptoms.  We will continue carvedilol 3.125 mg twice daily and increase lisinopril to 5 mg daily.  Spironolactone will remain at 12.5 mg daily.  I will check a basic metabolic panel today and again in 2 weeks.  [name] did not tolerate the LifeVest.  We will contact result to discontinue this.  We will plan to repeat a limited echo 3 months after his STEMI/PCI to determine if he would  benefit from ICD placement.  Hyperlipidemia Goal LDL is less than 70; LDL was 114 during recent admission.  [name] reports myalgias and subjective fevers that he attributes to atorvastatin.  These have already started to improve after atorvastatin was decreased from 80 to 40 mg daily yesterday.  It is reasonable to continue current dose and repeat a lipid panel in 2-3 months.  I will check a creatine kinase today, given report of significant myalgias.  Follow-up: Return to clinic in 2 months.  Nelva Bush, MD 02/03/2017 7:52 PM

## 2017-02-03 NOTE — Patient Instructions (Signed)
Medication Instructions:  Your physician has recommended you make the following change in your medication:  1- INCREASE Lisinopril to 5 mg by mouth once a day.   Labwork: Your physician recommends that you return for lab work in: TODAY (BMP, TOTAL CK) AT [place]. - [facility]. You will check in at the front desk to the right as you walk into the atrium. Valet Parking is offered if needed.   Your physician recommends that you return for lab work in: 2 WEEKS (BMP) 02/17/17 AT [place]. - Please go to the [facility]. You will check in at the front desk to the right as you walk into the atrium. Valet Parking is offered if needed.     Testing/Procedures: NONE  Follow-Up: Your physician recommends that you schedule a follow-up appointment in: 2 MONTHS WITH DR END.   If you need a refill on your cardiac medications before your next appointment, please call your pharmacy.

## 2017-02-04 ENCOUNTER — Ambulatory Visit: Payer: Self-pay | Admitting: Internal Medicine

## 2017-03-06 ENCOUNTER — Ambulatory Visit: Payer: Self-pay | Admitting: Family

## 2017-03-09 ENCOUNTER — Ambulatory Visit: Payer: Self-pay

## 2017-03-09 ENCOUNTER — Encounter (INDEPENDENT_AMBULATORY_CARE_PROVIDER_SITE_OTHER): Payer: Self-pay

## 2017-03-09 ENCOUNTER — Other Ambulatory Visit: Payer: Self-pay

## 2017-03-09 ENCOUNTER — Telehealth: Payer: Self-pay | Admitting: Pharmacy Technician

## 2017-03-09 VITALS — BP 132/78 | HR 78 | Ht 60.0 in | Wt 149.0 lb

## 2017-03-09 DIAGNOSIS — Z79899 Other long term (current) drug therapy: Secondary | ICD-10-CM

## 2017-03-09 NOTE — Telephone Encounter (Signed)
  Patient speaks Spanish.  Interpretation provided by Spanish Interpreter-Hiram Cutie.  Completed [facility] application and contract.  Patient agreed to all terms of the [facility] contract.  Completed financial assistance application for [place] due to recent hospital visit.  Patient agreed to be responsible for gathering financial information and forwarding to appropriate department in [facility].    Patient approved to receive generic medication assistance at [facility] through 2019, as long as eligibility criteria continues to be met.  Patient is undocumented.  Will prevent [place] from obtaining certain brand-name medications from pharmaceutical companies that require a patient to be either a legal resident or [place] Citizen.  Patient acknowledged that he understood.  [facility]

## 2017-03-09 NOTE — Progress Notes (Signed)
Medication Management Clinic Visit Note  Patient: Lucas [name] MRN: [id number] Date of Birth: [birth year] [facility]: [facility]   Emile Palestino 62 y.o. male presents for a medication therapy management visit today with the pharmacist. Patient was recently admitted to the hospital with a STEMI in 01/2017 where he received a DES.   BP 132/78 (BP Location: Right Arm)   Pulse 78   Ht 5' (1.524 m)   Wt 149 lb (67.6 kg)   BMI 29.10 kg/m   Patient Information   Past Medical History:  Diagnosis Date  . CHF (congestive heart failure) ([place])   . Hypertension   . STEMI (ST elevation myocardial infarction) [facility])       Past Surgical History:  Procedure Laterality Date  . APPENDECTOMY    . CARDIAC CATHETERIZATION    . CORONARY ANGIOPLASTY    . CORONARY/GRAFT ACUTE MI REVASCULARIZATION N/A 01/25/2017   Procedure: Coronary/Graft Acute MI Revascularization;  Surgeon: Nelva Bush, MD;  Location: [place] [facility];  Service: Cardiovascular;  Laterality: N/A;  . LEFT HEART CATH AND CORONARY ANGIOGRAPHY N/A 01/25/2017   Procedure: LEFT HEART CATH AND CORONARY ANGIOGRAPHY;  Surgeon: Nelva Bush, MD;  Location: [place] [facility];  Service: Cardiovascular;  Laterality: N/A;     Family History  Problem Relation Age of Onset  . Heart disease Father        s/p pacemaker  . Arrhythmia Father   . Asthma Father   . Asthma Mother    Outpatient Encounter Medications as of 03/09/2017  Medication Sig  . aspirin 81 MG chewable tablet Chew 1 tablet (81 mg total) by mouth daily.  Marland Kitchen atorvastatin (LIPITOR) 80 MG tablet Take 40 mg by mouth daily.  . carvedilol (COREG) 3.125 MG tablet Take 1 tablet (3.125 mg total) by mouth 2 (two) times daily with a meal.  . furosemide (LASIX) 20 MG tablet Take 1 tablet (20 mg total) by mouth daily.  Marland Kitchen lisinopril (PRINIVIL,ZESTRIL) 5 MG tablet Take 1 tablet (5 mg total) by mouth daily. (Patient taking differently: Take 2.5 mg by mouth  daily. )  . potassium chloride (K-DUR,KLOR-CON) 10 MEQ tablet Take 1 tablet (10 mEq total) by mouth daily.  Marland Kitchen spironolactone (ALDACTONE) 25 MG tablet Take 0.5 tablets (12.5 mg total) by mouth daily.  . ticagrelor (BRILINTA) 90 MG TABS tablet Take 1 tablet (90 mg total) by mouth 2 (two) times daily.   No facility-administered encounter medications on file as of 03/09/2017.      Family Support: Good and Comments:has 3 kids here with him that provide a great support system. His son was present today and seems to manage most of his conditions. His son is very diligent and keeps a good record of his medications and doctors appointments.   Lifestyle Diet: Breakfast:Vegetables, beans, tortillas Lunch: Bananas, apples Dinner: Sandwich, tortillas Drinks:Water, does not drink soda  I encouraged patient to continue current diet and exercise regimen.    Current Exercise Habits: Home exercise routine, Type of exercise: walking, Time (Minutes): 30       Social History   Substance and Sexual Activity  Alcohol Use No  . Frequency: Never      Social History   Tobacco Use  Smoking Status Never Smoker  Smokeless Tobacco Never Used      Health Maintenance  Topic Date Due  . Hepatitis C Screening  09-17-1955  . HIV Screening  11/16/1972  . TETANUS/TDAP  11/16/1976  . COLONOSCOPY  11/17/2007  . INFLUENZA VACCINE  10/01/2016   Health Maintenance/Date Completed  Last ED visit: 01/2017 Last Visit to PCP: N/A Next Visit to PCP: [facility] [place] Specialist Visit: CHF clinic - [facility]; Cardiologist - Dr. Harrell Gave End  Dental Exam: 5+ yrs Eye Exam: 10-15 yrs Prostate Exam: No DEXA: No Colonoscopy: No Flu Vaccine: 01/2017 Pneumonia Vaccine: No     Assessment and Plan:  CHF: spironolactone, furosemide, carvedilol. Patient was recently admitted to the hospital with STEMI and found to also have NYHA Stage II HF. Patient is on appropriate medications and is being  follow in HF clinic.   HTN: lisinopril, carvedilol. Patient's son keeps record of patient's blood pressure with readings (138/80, 128/74). Patient's blood pressure is currently well managed and his BP at this appointment was 132/78.   CAD: atorvastatin, aspirin, ticagrelor. Patient had recent STEMI and is on appropriate medications.   Patient seems very receptive to managing his heart conditions and has a great support system with his family. His son is very attentive to his fathers health and keeps good records of vitals, medications, and doctors visits. As patient is stable, I will schedule a follow up appointment in 6 months.    Lendon Ka, PharmD Pharmacy Resident

## 2017-03-09 NOTE — Progress Notes (Signed)
   Diet 3  Meals Breakfast:

## 2017-03-10 ENCOUNTER — Ambulatory Visit: Payer: Self-pay | Admitting: Family Medicine

## 2017-03-10 ENCOUNTER — Ambulatory Visit: Payer: Self-pay

## 2017-03-10 VITALS — BP 111/78 | HR 63 | Temp 98.0°F | Wt 149.7 lb

## 2017-03-10 DIAGNOSIS — I2102 ST elevation (STEMI) myocardial infarction involving left anterior descending coronary artery: Secondary | ICD-10-CM

## 2017-03-10 DIAGNOSIS — I10 Essential (primary) hypertension: Secondary | ICD-10-CM

## 2017-03-10 DIAGNOSIS — R05 Cough: Secondary | ICD-10-CM

## 2017-03-10 DIAGNOSIS — I5022 Chronic systolic (congestive) heart failure: Secondary | ICD-10-CM

## 2017-03-10 DIAGNOSIS — R059 Cough, unspecified: Secondary | ICD-10-CM

## 2017-03-10 DIAGNOSIS — R7309 Other abnormal glucose: Secondary | ICD-10-CM

## 2017-03-10 MED ORDER — ASPIRIN 81 MG PO CHEW: 81.0000 mg | CHEWABLE_TABLET | Freq: Every day | ORAL | 1 refills | Status: DC

## 2017-03-10 MED ORDER — FUROSEMIDE 20 MG PO TABS: 20.0000 mg | ORAL_TABLET | Freq: Every day | ORAL | 0 refills | Status: DC

## 2017-03-10 MED ORDER — SPIRONOLACTONE 25 MG PO TABS: 12.5000 mg | ORAL_TABLET | Freq: Every day | ORAL | 0 refills | Status: DC

## 2017-03-10 MED ORDER — ATORVASTATIN CALCIUM 80 MG PO TABS: 40.0000 mg | ORAL_TABLET | Freq: Every day | ORAL | 0 refills | Status: DC

## 2017-03-10 MED ORDER — POTASSIUM CHLORIDE CRYS ER 10 MEQ PO TBCR: 10.0000 meq | EXTENDED_RELEASE_TABLET | Freq: Every day | ORAL | 0 refills | Status: DC

## 2017-03-10 MED ORDER — CARVEDILOL 3.125 MG PO TABS: 3.1250 mg | ORAL_TABLET | Freq: Two times a day (BID) | ORAL | 0 refills | Status: DC

## 2017-03-10 NOTE — Progress Notes (Signed)
Name: Lucas [name]   MRN: [id number]    DOB: [birth year]   Date:03/10/2017       Progress Note  Subjective  Chief Complaint  Chief Complaint  Patient presents with  . Establish Care    concerned about allergies    HPI  Spanish Language Interpreter Utilized for Visit.  Pt has extensive cardiac history: - Sees Dr. Saunders Revel for Cardiology - follows up next month.  Note from 02/03/2017 reviewed in detail, refills of medications in accordance with Dr. Darnelle Bos recommendations will be provided today.  He also recommended a 2 week BMP check, which we will perform today. - History of STEMI involving LAD: Occurred 01/25/2017. Saw Dr. Saunders Revel for heart cath 02/03/17.  Endorses occasional muscular pain but denies chest pain.  Taking medications as prescribed.  - CHF: Denies chest pain or BLE edema; does have shortness of breath in the mornings and with increased exertion (walking fast for >30 minutes).  Taking aspirin, carvedilol, spironolactone, potassium, lasix as prescribed. - HTN: Doing well, BP is at goal today.  Taking medications as prescribed - is only taking 1/2 tablet (2.69m) Lisinopril - advised Dr. ESaunders Revelrecommended he take 570mdaily.  Denies headaches except with overexertion; no vision changes, no palpitations.  Elevated Glucose: Elevated glucose readings multiple times over the last month - highest was 243.  We will check A1C today.  Denies polyuria, polyphagia, or polydipsia.  BMP on 02/03/2017 shows ongoing hyperglycemia, kidney function and potassium are in range.  Cough: Notes cough that started several weeks ago.  He has been taking Delsym OTC for this which only some relief.  He denies chest pain or abnormal shortness of breath as above, no BLE edema, no fevers/chills.  He does note that at onset of the cough he did have some subjective fevers/chills and nasal congestion which have since resolved.  Patient Active Problem List   Diagnosis Date Noted  . Chronic systolic heart failure ([facility] 02/02/2017  . HTN (hypertension) 02/02/2017  . Myalgia 02/02/2017  . STEMI involving left anterior descending coronary artery ([facility]11/25/2018    Past Surgical History:  Procedure Laterality Date  . APPENDECTOMY    . CARDIAC CATHETERIZATION    . CORONARY ANGIOPLASTY    . CORONARY/GRAFT ACUTE MI REVASCULARIZATION N/A 01/25/2017   Procedure: Coronary/Graft Acute MI Revascularization;  Surgeon: EnNelva BushMD;  [facility];  Service: Cardiovascular;  Laterality: N/A;  . LEFT HEART CATH AND CORONARY ANGIOGRAPHY N/A 01/25/2017   Procedure: LEFT HEART CATH AND CORONARY ANGIOGRAPHY;  Surgeon: EnNelva BushMD;  [facility];  Service: Cardiovascular;  Laterality: N/A;    Family History  Problem Relation Age of Onset  . Heart disease Father        s/p pacemaker  . Arrhythmia Father   . Asthma Father   . Asthma Mother     Social History   Socioeconomic History  . Marital status: Married    Spouse name: Not on file  . Number of children: Not on file  . Years of education: Not on file  . Highest education level: Not on file  Social Needs  . Financial resource strain: Hard  . Food insecurity - worry: Not on file  . Food insecurity - inability: Not on file  . Transportation needs - medical: Not on file  . Transportation needs - non-medical: Not on file  Occupational History  . Not on file  Tobacco Use  . Smoking status: Never Smoker  .  Smokeless tobacco: Never Used  Substance and Sexual Activity  . Alcohol use: No    Frequency: Never  . Drug use: No  . Sexual activity: Yes    Birth control/protection: Coitus interruptus, None  Other Topics Concern  . Not on file  Social History Narrative  . Not on file     Current Outpatient Medications:  .  aspirin 81 MG chewable tablet, Chew 1 tablet (81 mg total) by mouth daily., Disp: 30 tablet, Rfl: 0 .  atorvastatin (LIPITOR) 80 MG tablet, Take 40 mg by mouth daily., Disp: , Rfl:  .   carvedilol (COREG) 3.125 MG tablet, Take 1 tablet (3.125 mg total) by mouth 2 (two) times daily with a meal., Disp: 60 tablet, Rfl: 0 .  furosemide (LASIX) 20 MG tablet, Take 1 tablet (20 mg total) by mouth daily., Disp: 30 tablet, Rfl: 0 .  lisinopril (PRINIVIL,ZESTRIL) 5 MG tablet, Take 1 tablet (5 mg total) by mouth daily. (Patient taking differently: Take 2.5 mg by mouth daily. ), Disp: 90 tablet, Rfl: 3 .  potassium chloride (K-DUR,KLOR-CON) 10 MEQ tablet, Take 1 tablet (10 mEq total) by mouth daily., Disp: 30 tablet, Rfl: 0 .  spironolactone (ALDACTONE) 25 MG tablet, Take 0.5 tablets (12.5 mg total) by mouth daily., Disp: 30 tablet, Rfl: 0 .  ticagrelor (BRILINTA) 90 MG TABS tablet, Take 1 tablet (90 mg total) by mouth 2 (two) times daily., Disp: 60 tablet, Rfl: 0  No Known Allergies   ROS  Constitutional: Negative for fever or weight change.  Respiratory: Negative for cough and shortness of breath.   Cardiovascular:  See HPI or palpitations.  Gastrointestinal: Negative for abdominal pain, no bowel changes.  Musculoskeletal: Negative for gait problem or joint swelling.  Skin: Negative for rash.  Neurological: Negative for dizziness or headache.  No other specific complaints in a complete review of systems (except as listed in HPI above).  Objective  Vitals:   03/10/17 1846  BP: 111/78  Pulse: 63  Temp: 98 F (36.7 C)  TempSrc: Oral  Weight: 149 lb 11.2 oz (67.9 kg)   Body mass index is 29.24 kg/m.  Physical Exam Constitutional: Patient appears well-developed and well-nourished. No distress.  HENT: Head: Normocephalic and atraumatic. Cardiovascular: Normal rate, regular rhythm and normal heart sounds.  No murmur heard. No BLE edema. Pulmonary/Chest: Effort normal and breath sounds clear but diminished in BLL. No respiratory distress. Neurological: he is alert and oriented to person, place, and time. No cranial nerve deficit. Coordination, balance, strength, speech and  gait are normal.  Skin: Skin is warm and dry. No rash noted. No erythema.  Psychiatric: Patient has a normal mood and affect. behavior is normal. Judgment and thought content normal.  Recent Results (from the past 2160 hour(s))  CBC with Differential/Platelet     Status: Abnormal   Collection Time: 01/25/17  5:30 PM  Result Value Ref Range   WBC 10.9 (H) 3.8 - 10.6 K/uL   RBC 5.16 4.40 - 5.90 MIL/uL   Hemoglobin 16.2 13.0 - 18.0 g/dL   HCT 46.6 40.0 - 52.0 %   MCV 90.4 80.0 - 100.0 fL   MCH 31.3 26.0 - 34.0 pg   MCHC 34.6 32.0 - 36.0 g/dL   RDW 13.1 11.5 - 14.5 %   Platelets 215 150 - 440 K/uL   Neutrophils Relative % 76 %   Neutro Abs 8.3 (H) 1.4 - 6.5 K/uL   Lymphocytes Relative 15 %   Lymphs Abs 1.7 1.0 -  3.6 K/uL   Monocytes Relative 8 %   Monocytes Absolute 0.8 0.2 - 1.0 K/uL   Eosinophils Relative 1 %   Eosinophils Absolute 0.1 0 - 0.7 K/uL   Basophils Relative 0 %   Basophils Absolute 0.0 0 - 0.1 K/uL  Protime-INR     Status: None   Collection Time: 01/25/17  5:30 PM  Result Value Ref Range   Prothrombin Time 13.6 11.4 - 15.2 seconds   INR 1.05   APTT     Status: None   Collection Time: 01/25/17  5:30 PM  Result Value Ref Range   aPTT 33 24 - 36 seconds  Comprehensive metabolic panel     Status: Abnormal   Collection Time: 01/25/17  5:30 PM  Result Value Ref Range   Sodium 138 135 - 145 mmol/L   Potassium 3.6 3.5 - 5.1 mmol/L   Chloride 104 101 - 111 mmol/L   CO2 24 22 - 32 mmol/L   Glucose, Bld 196 (H) 65 - 99 mg/dL   BUN 13 6 - 20 mg/dL   Creatinine, Ser 0.77 0.61 - 1.24 mg/dL   Calcium 9.3 8.9 - 10.3 mg/dL   Total Protein 7.4 6.5 - 8.1 g/dL   Albumin 4.3 3.5 - 5.0 g/dL   AST 230 (H) 15 - 41 U/L   ALT 49 17 - 63 U/L   Alkaline Phosphatase 63 38 - 126 U/L   Total Bilirubin 0.7 0.3 - 1.2 mg/dL   GFR calc non Af Amer >60 >60 mL/min   GFR calc Af Amer >60 >60 mL/min    Comment: (NOTE) The eGFR has been calculated using the CKD EPI equation. This  calculation has not been validated in all clinical situations. eGFR's persistently <60 mL/min signify possible Chronic Kidney Disease.    Anion gap 10 5 - 15  Troponin I     Status: Abnormal   Collection Time: 01/25/17  5:30 PM  Result Value Ref Range   Troponin I 37.34 (HH) <0.03 ng/mL    Comment: CRITICAL RESULT CALLED TO, READ BACK BY AND VERIFIED WITH KELLY PENDLETON @ 1816 01/25/17 [place]   Lipid panel     Status: Abnormal   Collection Time: 01/25/17  5:30 PM  Result Value Ref Range   Cholesterol 184 0 - 200 mg/dL   Triglycerides 132 <150 mg/dL   HDL 44 >40 mg/dL   Total CHOL/HDL Ratio 4.2 RATIO   VLDL 26 0 - 40 mg/dL   LDL Cholesterol 114 (H) 0 - 99 mg/dL    Comment:        Total Cholesterol/HDL:CHD Risk Coronary Heart Disease Risk Table                     Men   Women  1/2 Average Risk   3.4   3.3  Average Risk       5.0   4.4  2 X Average Risk   9.6   7.1  3 X Average Risk  23.4   11.0        Use the calculated Patient Ratio above and the CHD Risk Table to determine the patient's CHD Risk.        ATP III CLASSIFICATION (LDL):  <100     mg/dL   Optimal  100-129  mg/dL   Near or Above                    Optimal  130-159  mg/dL  Borderline  160-189  mg/dL   High  >190     mg/dL   Very High   Hemoglobin A1c     Status: Abnormal   Collection Time: 01/25/17  5:30 PM  Result Value Ref Range   Hgb A1c MFr Bld 6.0 (H) 4.8 - 5.6 %    Comment: (NOTE)         Prediabetes: 5.7 - 6.4         Diabetes: >6.4         Glycemic control for adults with diabetes: <7.0    Mean Plasma Glucose 126 mg/dL    Comment: (NOTE) Performed At: [facility] [place], [place] [id number] Rush Farmer MD [id number]   TSH     Status: None   Collection Time: 01/25/17  5:30 PM  Result Value Ref Range   TSH 2.060 0.350 - 4.500 uIU/mL    Comment: Performed by a 3rd Generation assay with a functional sensitivity of <=0.01 uIU/mL.  Type and screen     Status: None    Collection Time: 01/25/17  5:52 PM  Result Value Ref Range   ABO/RH(D) O POS    Antibody Screen NEG    Sample Expiration 01/28/2017   POCT Activated clotting time     Status: None   Collection Time: 01/25/17  6:54 PM  Result Value Ref Range   Activated Clotting Time 241 seconds  POCT Activated clotting time     Status: None   Collection Time: 01/25/17  7:12 PM  Result Value Ref Range   Activated Clotting Time 290 seconds  Glucose, capillary     Status: Abnormal   Collection Time: 01/25/17  8:05 PM  Result Value Ref Range   Glucose-Capillary 111 (H) 65 - 99 mg/dL  Glucose, capillary     Status: Abnormal   Collection Time: 01/25/17  9:53 PM  Result Value Ref Range   Glucose-Capillary 164 (H) 65 - 99 mg/dL   Comment 1 Notify RN   Urine Drug Screen, Qualitative ([facility] only)     Status: Abnormal   Collection Time: 01/25/17 10:34 PM  Result Value Ref Range   Tricyclic, Ur Screen NONE DETECTED NONE DETECTED   Amphetamines, Ur Screen NONE DETECTED NONE DETECTED   MDMA (Ecstasy)Ur Screen NONE DETECTED NONE DETECTED   Cocaine Metabolite,Ur [place] NONE DETECTED NONE DETECTED   Opiate, Ur Screen NONE DETECTED NONE DETECTED   Phencyclidine (PCP) Ur S NONE DETECTED NONE DETECTED   Cannabinoid 50 Ng, Ur [place] NONE DETECTED NONE DETECTED   Barbiturates, Ur Screen NONE DETECTED NONE DETECTED   Benzodiazepine, Ur Scrn POSITIVE (A) NONE DETECTED   Methadone Scn, Ur NONE DETECTED NONE DETECTED    Comment: (NOTE) 327  Tricyclics, urine               Cutoff 1000 ng/mL 200  Amphetamines, urine             Cutoff 1000 ng/mL 300  MDMA (Ecstasy), urine           Cutoff 500 ng/mL 400  Cocaine Metabolite, urine       Cutoff 300 ng/mL 500  Opiate, urine                   Cutoff 300 ng/mL 600  Phencyclidine (PCP), urine      Cutoff 25 ng/mL 700  Cannabinoid, urine              Cutoff 50 ng/mL 800  Barbiturates,  urine             Cutoff 200 ng/mL 900  Benzodiazepine, urine           Cutoff 200 ng/mL 1000  Methadone, urine                Cutoff 300 ng/mL 1100 1200 The urine drug screen provides only a preliminary, unconfirmed 1300 analytical test result and should not be used for non-medical 1400 purposes. Clinical consideration and professional judgment should 1500 be applied to any positive drug screen result due to possible 1600 interfering substances. A more specific alternate chemical method 1700 must be used in order to obtain a confirmed analytical result.  1800 Gas chromato graphy / mass spectrometry (GC/MS) is the preferred 1900 confirmatory method.   MRSA PCR Screening     Status: None   Collection Time: 01/25/17 10:34 PM  Result Value Ref Range   MRSA by PCR NEGATIVE NEGATIVE    Comment:        The GeneXpert MRSA Assay (FDA approved for NASAL specimens only), is one component of a comprehensive MRSA colonization surveillance program. It is not intended to diagnose MRSA infection nor to guide or monitor treatment for MRSA infections.   Troponin I     Status: Abnormal   Collection Time: 01/25/17 11:16 PM  Result Value Ref Range   Troponin I >65.00 (HH) <0.03 ng/mL    Comment: CRITICAL RESULT CALLED TO, READ BACK BY AND VERIFIED WITH TANYA SILVA AT 2353 ON 01/25/17 [place]   Basic metabolic panel     Status: Abnormal   Collection Time: 01/26/17  2:55 AM  Result Value Ref Range   Sodium 139 135 - 145 mmol/L   Potassium 3.7 3.5 - 5.1 mmol/L   Chloride 107 101 - 111 mmol/L   CO2 23 22 - 32 mmol/L   Glucose, Bld 149 (H) 65 - 99 mg/dL   BUN 13 6 - 20 mg/dL   Creatinine, Ser 0.78 0.61 - 1.24 mg/dL   Calcium 8.8 (L) 8.9 - 10.3 mg/dL   GFR calc non Af Amer >60 >60 mL/min   GFR calc Af Amer >60 >60 mL/min    Comment: (NOTE) The eGFR has been calculated using the CKD EPI equation. This calculation has not been validated in all clinical situations. eGFR's persistently <60 mL/min signify possible Chronic Kidney Disease.    Anion gap 9 5 - 15  CBC     Status: Abnormal    Collection Time: 01/26/17  2:55 AM  Result Value Ref Range   WBC 12.3 (H) 3.8 - 10.6 K/uL   RBC 4.98 4.40 - 5.90 MIL/uL   Hemoglobin 15.3 13.0 - 18.0 g/dL   HCT 44.4 40.0 - 52.0 %   MCV 89.3 80.0 - 100.0 fL   MCH 30.8 26.0 - 34.0 pg   MCHC 34.4 32.0 - 36.0 g/dL   RDW 13.0 11.5 - 14.5 %   Platelets 214 150 - 440 K/uL  Troponin I (q 6hr x 3)     Status: Abnormal   Collection Time: 01/26/17  2:55 AM  Result Value Ref Range   Troponin I >65.00 (HH) <0.03 ng/mL    Comment: CRITICAL VALUE NOTED. VALUE IS CONSISTENT WITH PREVIOUSLY REPORTED/CALLED VALUE [facility]   Glucose, capillary     Status: Abnormal   Collection Time: 01/26/17  7:18 AM  Result Value Ref Range   Glucose-Capillary 126 (H) 65 - 99 mg/dL  Troponin I (q 6hr x 3)  Status: Abnormal   Collection Time: 01/26/17  8:53 AM  Result Value Ref Range   Troponin I 46.55 (HH) <0.03 ng/mL    Comment: CRITICAL VALUE NOTED. VALUE IS CONSISTENT WITH PREVIOUSLY REPORTED/CALLED VALUE...[place]  Glucose, capillary     Status: Abnormal   Collection Time: 01/26/17 12:05 PM  Result Value Ref Range   Glucose-Capillary 191 (H) 65 - 99 mg/dL  ECHOCARDIOGRAM COMPLETE     Status: None   Collection Time: 01/26/17  3:31 PM  Result Value Ref Range   Weight 2,349.22 oz   Height 68 in   BP 107/82 mmHg  Glucose, capillary     Status: Abnormal   Collection Time: 01/26/17  5:00 PM  Result Value Ref Range   Glucose-Capillary 110 (H) 65 - 99 mg/dL  Glucose, capillary     Status: Abnormal   Collection Time: 01/26/17  9:15 PM  Result Value Ref Range   Glucose-Capillary 153 (H) 65 - 99 mg/dL  Glucose, capillary     Status: Abnormal   Collection Time: 01/27/17  7:31 AM  Result Value Ref Range   Glucose-Capillary 112 (H) 65 - 99 mg/dL  Basic metabolic panel     Status: Abnormal   Collection Time: 01/27/17 11:16 AM  Result Value Ref Range   Sodium 137 135 - 145 mmol/L   Potassium 3.8 3.5 - 5.1 mmol/L   Chloride 104 101 - 111 mmol/L   CO2 22 22 - 32  mmol/L   Glucose, Bld 243 (H) 65 - 99 mg/dL   BUN 14 6 - 20 mg/dL   Creatinine, Ser 0.83 0.61 - 1.24 mg/dL   Calcium 8.9 8.9 - 10.3 mg/dL   GFR calc non Af Amer >60 >60 mL/min   GFR calc Af Amer >60 >60 mL/min    Comment: (NOTE) The eGFR has been calculated using the CKD EPI equation. This calculation has not been validated in all clinical situations. eGFR's persistently <60 mL/min signify possible Chronic Kidney Disease.    Anion gap 11 5 - 15  Glucose, capillary     Status: Abnormal   Collection Time: 01/27/17 11:42 AM  Result Value Ref Range   Glucose-Capillary 211 (H) 65 - 99 mg/dL  Glucose, capillary     Status: Abnormal   Collection Time: 01/27/17  5:10 PM  Result Value Ref Range   Glucose-Capillary 135 (H) 65 - 99 mg/dL  Glucose, capillary     Status: Abnormal   Collection Time: 01/27/17  9:02 PM  Result Value Ref Range   Glucose-Capillary 138 (H) 65 - 99 mg/dL  Basic metabolic panel     Status: Abnormal   Collection Time: 01/28/17  4:55 AM  Result Value Ref Range   Sodium 136 135 - 145 mmol/L   Potassium 3.4 (L) 3.5 - 5.1 mmol/L   Chloride 104 101 - 111 mmol/L   CO2 24 22 - 32 mmol/L   Glucose, Bld 122 (H) 65 - 99 mg/dL   BUN 15 6 - 20 mg/dL   Creatinine, Ser 0.74 0.61 - 1.24 mg/dL   Calcium 8.9 8.9 - 10.3 mg/dL   GFR calc non Af Amer >60 >60 mL/min   GFR calc Af Amer >60 >60 mL/min    Comment: (NOTE) The eGFR has been calculated using the CKD EPI equation. This calculation has not been validated in all clinical situations. eGFR's persistently <60 mL/min signify possible Chronic Kidney Disease.    Anion gap 8 5 - 15  CBC  Status: None   Collection Time: 01/28/17  8:26 AM  Result Value Ref Range   WBC 8.5 3.8 - 10.6 K/uL   RBC 4.87 4.40 - 5.90 MIL/uL   Hemoglobin 14.8 13.0 - 18.0 g/dL   HCT 43.7 40.0 - 52.0 %   MCV 89.8 80.0 - 100.0 fL   MCH 30.4 26.0 - 34.0 pg   MCHC 33.9 32.0 - 36.0 g/dL   RDW 13.0 11.5 - 14.5 %   Platelets 195 150 - 440 K/uL   Magnesium     Status: None   Collection Time: 01/28/17  8:26 AM  Result Value Ref Range   Magnesium 2.0 1.7 - 2.4 mg/dL  Basic metabolic panel     Status: Abnormal   Collection Time: 02/03/17  4:22 PM  Result Value Ref Range   Sodium 137 135 - 145 mmol/L   Potassium 4.4 3.5 - 5.1 mmol/L   Chloride 101 101 - 111 mmol/L   CO2 25 22 - 32 mmol/L   Glucose, Bld 123 (H) 65 - 99 mg/dL   BUN 19 6 - 20 mg/dL   Creatinine, Ser 0.99 0.61 - 1.24 mg/dL   Calcium 9.3 8.9 - 10.3 mg/dL   GFR calc non Af Amer >60 >60 mL/min   GFR calc Af Amer >60 >60 mL/min    Comment: (NOTE) The eGFR has been calculated using the CKD EPI equation. This calculation has not been validated in all clinical situations. eGFR's persistently <60 mL/min signify possible Chronic Kidney Disease.    Anion gap 11 5 - 15  CK Total (and CKMB)     Status: None   Collection Time: 02/03/17  4:22 PM  Result Value Ref Range   Total CK 54 49 - 397 U/L   CK, MB 1.0 0.5 - 5.0 ng/mL   Relative Index RELATIVE INDEX IS INVALID 0.0 - 2.5    Comment: WHEN CK < 100 U/L           Assessment & Plan  1. Chronic systolic heart failure (HCC) - carvedilol (COREG) 3.125 MG tablet; Take 1 tablet (3.125 mg total) by mouth 2 (two) times daily with a meal.  Dispense: 180 tablet; Refill: 0 - spironolactone (ALDACTONE) 25 MG tablet; Take 0.5 tablets (12.5 mg total) by mouth daily.  Dispense: 45 tablet; Refill: 0 - potassium chloride (K-DUR,KLOR-CON) 10 MEQ tablet; Take 1 tablet (10 mEq total) by mouth daily.  Dispense: 90 tablet; Refill: 0 - furosemide (LASIX) 20 MG tablet; Take 1 tablet (20 mg total) by mouth daily.  Dispense: 90 tablet; Refill: 0 - aspirin 81 MG chewable tablet; Chew 1 tablet (81 mg total) by mouth daily.  Dispense: 90 tablet; Refill: 1 - Basic Metabolic Panel (BMET) - DG Chest 2 View; Future  2. Essential hypertension - carvedilol (COREG) 3.125 MG tablet; Take 1 tablet (3.125 mg total) by mouth 2 (two) times daily with a  meal.  Dispense: 180 tablet; Refill: 0 - furosemide (LASIX) 20 MG tablet; Take 1 tablet (20 mg total) by mouth daily.  Dispense: 90 tablet; Refill: 0 - aspirin 81 MG chewable tablet; Chew 1 tablet (81 mg total) by mouth daily.  Dispense: 90 tablet; Refill: 1 - Basic Metabolic Panel (BMET)  3. STEMI involving left anterior descending coronary artery (HCC) - carvedilol (COREG) 3.125 MG tablet; Take 1 tablet (3.125 mg total) by mouth 2 (two) times daily with a meal.  Dispense: 180 tablet; Refill: 0 - spironolactone (ALDACTONE) 25 MG tablet; Take 0.5 tablets (12.5  mg total) by mouth daily.  Dispense: 45 tablet; Refill: 0 - atorvastatin (LIPITOR) 80 MG tablet; Take 0.5 tablets (40 mg total) by mouth daily.  Dispense: 90 tablet; Refill: 0 - aspirin 81 MG chewable tablet; Chew 1 tablet (81 mg total) by mouth daily.  Dispense: 90 tablet; Refill: 1 - Note sent to Dr. Saunders Revel to determine if Clopidogrel is viable alternative for Brilinta as Brilinta is not covered with medication management. Will defer to him for recommendation.  4. Elevated glucose level - Hemoglobin K8E - Basic Metabolic Panel (BMET)  5. Cough - DG Chest 2 View; Future  Return in about 1 month (around 04/10/2017) for Follow Up.

## 2017-03-11 ENCOUNTER — Other Ambulatory Visit: Payer: Self-pay | Admitting: Family Medicine

## 2017-03-11 ENCOUNTER — Encounter: Payer: Self-pay | Admitting: Family Medicine

## 2017-03-11 DIAGNOSIS — I2102 ST elevation (STEMI) myocardial infarction involving left anterior descending coronary artery: Secondary | ICD-10-CM

## 2017-03-11 LAB — BASIC METABOLIC PANEL
BUN/Creatinine Ratio: 21 — ABNORMAL HIGH (ref 9–20)
BUN: 18 mg/dL (ref 6–24)
CALCIUM: 9.9 mg/dL (ref 8.7–10.2)
CHLORIDE: 100 mmol/L (ref 96–106)
CO2: 21 mmol/L (ref 20–29)
CREATININE: 0.84 mg/dL (ref 0.76–1.27)
GFR calc Af Amer: 111 mL/min/{1.73_m2} (ref >59)
GFR calc non Af Amer: 96 mL/min/{1.73_m2} (ref >59)
GLUCOSE: 143 mg/dL — AB (ref 65–99)
Potassium: 4.1 mmol/L (ref 3.5–5.2)
Sodium: 140 mmol/L (ref 134–144)

## 2017-03-11 LAB — HEMOGLOBIN A1C
Est. average glucose Bld gHb Est-mCnc: 134 mg/dL
HEMOGLOBIN A1C: 6.3 % — AB (ref 4.8–5.6)

## 2017-03-11 MED ORDER — SPIRONOLACTONE 25 MG PO TABS: 12.5000 mg | ORAL_TABLET | Freq: Every day | ORAL | 0 refills | Status: DC

## 2017-03-11 MED ORDER — ASPIRIN 81 MG PO CHEW: 81.0000 mg | CHEWABLE_TABLET | Freq: Every day | ORAL | 1 refills | Status: DC

## 2017-03-11 MED ORDER — FUROSEMIDE 20 MG PO TABS: 20.0000 mg | ORAL_TABLET | Freq: Every day | ORAL | 0 refills | Status: DC

## 2017-03-11 MED ORDER — CARVEDILOL 3.125 MG PO TABS: 3.1250 mg | ORAL_TABLET | Freq: Two times a day (BID) | ORAL | 0 refills | Status: DC

## 2017-03-11 MED ORDER — CLOPIDOGREL BISULFATE 300 MG PO TABS: 300.0000 mg | ORAL_TABLET | Freq: Once | ORAL | 0 refills | Status: AC

## 2017-03-11 MED ORDER — CLOPIDOGREL BISULFATE 75 MG PO TABS: 75.0000 mg | ORAL_TABLET | Freq: Every day | ORAL | 0 refills | Status: DC

## 2017-03-11 MED ORDER — CLOPIDOGREL BISULFATE 300 MG PO TABS: 300.0000 mg | ORAL_TABLET | Freq: Once | ORAL | 0 refills | Status: DC

## 2017-03-11 MED ORDER — POTASSIUM CHLORIDE CRYS ER 10 MEQ PO TBCR: 10.0000 meq | EXTENDED_RELEASE_TABLET | Freq: Every day | ORAL | 0 refills | Status: DC

## 2017-03-11 NOTE — Progress Notes (Signed)
Per Dr. Saunders Revel: When he runs out of ticagrelor, he should take clopidogrel 300 mg x 1 when his next dose of ticagrelor is due, followed by clopidogrel 75 mg daily thereafter. This is ordered and sent to medication management.

## 2017-03-16 NOTE — Progress Notes (Signed)
Patient ID: Lucas [name], male    DOB: 01/30/1956, 62 y.o.   MRN: [id number]  HPI  [name] is a 62 y/o male with a history of STEMI, HTN and heart failure.  Echo report done 01/26/17 reviewed and shows an EF of 30-35% along with mild MR. Cardiac catheterization done 01/25/17 showed mild to moderate non-obstructive disease involving the mid/distal LAD and RCA. Moderately elevated left ventricular filling pressure. 100% thrombotic occlusion of the proximal LAD. DES was successfully placed.   Admitted 01/25/17 due to a late presenting STEMI. Had acute plaque rupture and 100% thrombotic occlusion with successful DES. Troponin peaked at >65. Aspirin and brilinta for at least 12 months. Lifevest applied with decision of a repeat echo in 3 months. Discharged home after 3 days.   He presents today for a follow-up visit with a chief complaint of mild shortness of breath upon moderate exertion. He says this has been present for several months but he does feel like it's improving. He has associated fatigue along with this. He denies any chest pain, edema, palpitations, dizziness, difficulty sleeping or weight gain.   Past Medical History:  Diagnosis Date  . CHF (congestive heart failure) ([name])   . Hypertension   . STEMI (ST elevation myocardial infarction) [facility])    Past Surgical History:  Procedure Laterality Date  . APPENDECTOMY    . CARDIAC CATHETERIZATION    . CORONARY ANGIOPLASTY    . CORONARY/GRAFT ACUTE MI REVASCULARIZATION N/A 01/25/2017   Procedure: Coronary/Graft Acute MI Revascularization;  Surgeon: Nelva Bush, MD;  Location: [facility];  Service: Cardiovascular;  Laterality: N/A;  . LEFT HEART CATH AND CORONARY ANGIOGRAPHY N/A 01/25/2017   Procedure: LEFT HEART CATH AND CORONARY ANGIOGRAPHY;  Surgeon: Nelva Bush, MD;  Location: [facility];  Service: Cardiovascular;  Laterality: N/A;   Family History  Problem Relation Age of Onset  . Heart  disease Father        s/p pacemaker  . Arrhythmia Father   . Asthma Father   . Asthma Mother    Social History   Tobacco Use  . Smoking status: Never Smoker  . Smokeless tobacco: Never Used  Substance Use Topics  . Alcohol use: No    Frequency: Never   No Known Allergies  Prior to Admission medications   Medication Sig Start Date End Date Taking? Authorizing Provider  aspirin 81 MG chewable tablet Chew 1 tablet (81 mg total) by mouth daily. 03/11/17  Yes Hubbard Hartshorn, FNP  atorvastatin (LIPITOR) 80 MG tablet Take 0.5 tablets (40 mg total) by mouth daily. 03/10/17  Yes Hubbard Hartshorn, FNP  carvedilol (COREG) 3.125 MG tablet Take 1 tablet (3.125 mg total) by mouth 2 (two) times daily with a meal. 03/11/17  Yes Hubbard Hartshorn, FNP  clopidogrel (PLAVIX) 75 MG tablet Take 1 tablet (75 mg total) by mouth daily. Begin the day after taking 300mg  Clopidogrel. 03/11/17  Yes Hubbard Hartshorn, FNP  furosemide (LASIX) 20 MG tablet Take 1 tablet (20 mg total) by mouth daily. 03/11/17  Yes Hubbard Hartshorn, FNP  lisinopril (PRINIVIL,ZESTRIL) 5 MG tablet Take 1 tablet (5 mg total) by mouth daily. Patient taking differently: Take 2.5 mg by mouth daily.  02/03/17 05/04/17 Yes End, Harrell Gave, MD  potassium chloride (K-DUR,KLOR-CON) 10 MEQ tablet Take 1 tablet (10 mEq total) by mouth daily. 03/11/17  Yes Hubbard Hartshorn, FNP  spironolactone (ALDACTONE) 25 MG tablet Take 0.5 tablets (12.5 mg total) by mouth  daily. 03/11/17  Yes Hubbard Hartshorn, FNP  ticagrelor (BRILINTA) 90 MG TABS tablet Take 1 tablet (90 mg total) by mouth 2 (two) times daily. 01/28/17  Yes Vaughan Basta, MD    Review of Systems  Constitutional: Positive for fatigue (minimal). Negative for appetite change.  HENT: Negative for congestion, postnasal drip and sore throat.   Eyes: Negative.   Respiratory: Positive for shortness of breath. Negative for chest tightness.   Cardiovascular: Negative for chest pain, palpitations and leg swelling.   Gastrointestinal: Negative for abdominal distention and abdominal pain.  Endocrine: Negative.   Genitourinary: Negative.   Musculoskeletal: Negative for back pain and neck pain.  Skin: Negative.   Allergic/Immunologic: Negative.   Neurological: Negative for dizziness, weakness and light-headedness.  Hematological: Negative for adenopathy. Does not bruise/bleed easily.  Psychiatric/Behavioral: Negative for dysphoric mood and sleep disturbance (sleeping on 2 pillows). The patient is not nervous/anxious.    Vitals:   03/17/17 1006  BP: 130/76  Pulse: 68  Resp: 18  SpO2: 99%  Weight: 148 lb (67.1 kg)  Height: 5' (1.524 m)   Wt Readings from Last 3 Encounters:  03/17/17 148 lb (67.1 kg)  03/10/17 149 lb 11.2 oz (67.9 kg)  03/09/17 149 lb (67.6 kg)   Lab Results  Component Value Date   CREATININE 0.84 03/10/2017   CREATININE 0.99 02/03/2017   CREATININE 0.74 01/28/2017    Physical Exam  Constitutional: He is oriented to person, place, and time. He appears well-developed and well-nourished.  HENT:  Head: Normocephalic and atraumatic.  Neck: Normal range of motion. Neck supple. No JVD present.  Cardiovascular: Normal rate and regular rhythm.  Pulmonary/Chest: Effort normal. No respiratory distress. He has no wheezes. He has no rales.  Abdominal: Soft. He exhibits no distension. There is no tenderness.  Musculoskeletal: He exhibits no edema or tenderness.  Neurological: He is alert and oriented to person, place, and time.  Skin: Skin is warm and dry.  Psychiatric: He has a normal mood and affect. His behavior is normal. Thought content normal.  Nursing note and vitals reviewed.  Assessment & Plan:  1: Chronic heart failure with reduced ejection fraction- - NYHA class II - euvolemic today - weighing daily. Reminded to call for an overnight weight gain of >2 pounds or a weekly weight gain of >5 pounds - not adding salt and he and his wife have been reading food labels.  Discussed the importance of closely following a 2000mg  sodium diet.  - could consider changing his lisinopril to entresto if his BP would allow - could also consider titrating up carvedilol in the future depending on heart rate - reinforced that he needed to take 5mg  lisinopril per cardiology note  2: STEMI- - now taking aspirin and clopidogrel - saw cardiologist (End) 02/03/17 & returns 04/29/17  3: HTN-  - BP looks good today - went to [facility] 03/10/17 & returns 04/14/17 - BMP from 03/10/17 reviewed and showed sodium 140, potassium 4.1 and GFR 96  4: Myalgia- - much better since decreasing atorvastatin to 40mg  daily  Patient did not bring his medications nor a list. Each medication was verbally reviewed with the patient and he was encouraged to bring the bottles to every visit to confirm accuracy of list.  Return in 3 months or sooner for any questions/problems before then.   Hospital medical interpreter was used throughout the entire visit.

## 2017-03-17 ENCOUNTER — Other Ambulatory Visit: Payer: Self-pay

## 2017-03-17 ENCOUNTER — Encounter: Payer: Self-pay | Admitting: Family

## 2017-03-17 ENCOUNTER — Ambulatory Visit: Payer: Self-pay | Attending: Family | Admitting: Family

## 2017-03-17 VITALS — BP 130/76 | HR 68 | Resp 18 | Ht 60.0 in | Wt 148.0 lb

## 2017-03-17 DIAGNOSIS — I252 Old myocardial infarction: Secondary | ICD-10-CM | POA: Insufficient documentation

## 2017-03-17 DIAGNOSIS — Z7902 Long term (current) use of antithrombotics/antiplatelets: Secondary | ICD-10-CM | POA: Insufficient documentation

## 2017-03-17 DIAGNOSIS — Z8249 Family history of ischemic heart disease and other diseases of the circulatory system: Secondary | ICD-10-CM | POA: Insufficient documentation

## 2017-03-17 DIAGNOSIS — I110 Hypertensive heart disease with heart failure: Secondary | ICD-10-CM | POA: Insufficient documentation

## 2017-03-17 DIAGNOSIS — Z7982 Long term (current) use of aspirin: Secondary | ICD-10-CM | POA: Insufficient documentation

## 2017-03-17 DIAGNOSIS — Z825 Family history of asthma and other chronic lower respiratory diseases: Secondary | ICD-10-CM | POA: Insufficient documentation

## 2017-03-17 DIAGNOSIS — I10 Essential (primary) hypertension: Secondary | ICD-10-CM

## 2017-03-17 DIAGNOSIS — Z9861 Coronary angioplasty status: Secondary | ICD-10-CM | POA: Insufficient documentation

## 2017-03-17 DIAGNOSIS — I2102 ST elevation (STEMI) myocardial infarction involving left anterior descending coronary artery: Secondary | ICD-10-CM

## 2017-03-17 DIAGNOSIS — M791 Myalgia, unspecified site: Secondary | ICD-10-CM | POA: Insufficient documentation

## 2017-03-17 DIAGNOSIS — I5022 Chronic systolic (congestive) heart failure: Secondary | ICD-10-CM | POA: Insufficient documentation

## 2017-03-17 DIAGNOSIS — Z79899 Other long term (current) drug therapy: Secondary | ICD-10-CM | POA: Insufficient documentation

## 2017-03-17 NOTE — Patient Instructions (Signed)
Continue weighing daily and call for an overnight weight gain of > 2 pounds or a weekly weight gain of >5 pounds. 

## 2017-03-25 ENCOUNTER — Telehealth: Payer: Self-pay | Admitting: Internal Medicine

## 2017-03-25 NOTE — Telephone Encounter (Signed)
Patient son calling Patient was on Brilinta - at last office visit Dr End switched to Plavix Since taking new medication he has been drowsy, numb in the temples and feverish Please call to discuss

## 2017-03-25 NOTE — Telephone Encounter (Signed)
S/w patient's son. He verbalized understanding to continue plavix and call us if the symptoms recur. He also verbalized understanding to call 911 if patient has chest pain.

## 2017-03-25 NOTE — Telephone Encounter (Signed)
Called and s/w patient and son with patient's permission. Patient finished his Brilinta prescription and took his first dose of Plavix yesterday morning. About 8 hours later around 6 pm, patient began to feel drowsy and get confused. Around 9:30 pm, patient was still drowsy, had numbness in the temples of his forehead, chest pain and hallucinations.  Patient said he looked at himself in the mirror and he was pale in the face. Patient reports this went on until 0500 in the morning. Since 5 am, patient reports feeling normal and having no symptoms. Patient has not had dose of plavix today and want to make sure with the doctor its ok to continue taking.  Advised I will route to Dr End and get back with him soon as it is very important to take this medication. They verbalized understanding.

## 2017-03-25 NOTE — Telephone Encounter (Signed)
These symptoms are unusual for clopidogrel, with confusion and drowsiness being reported in <1% of patients taking clopidogrel. I recommend that he continue with clopidogrel. If the symptoms recur, we will need to stop the medication and switch him back to ticagrelor. If he has recurrent chest pain, he should call 911.  Nelva Bush, MD [facility] Pager: [phone]

## 2017-04-14 ENCOUNTER — Ambulatory Visit: Payer: Self-pay | Admitting: Family Medicine

## 2017-04-14 VITALS — BP 123/76 | HR 64 | Temp 98.2°F | Wt 149.4 lb

## 2017-04-14 DIAGNOSIS — R059 Cough, unspecified: Secondary | ICD-10-CM

## 2017-04-14 DIAGNOSIS — R7303 Prediabetes: Secondary | ICD-10-CM | POA: Insufficient documentation

## 2017-04-14 DIAGNOSIS — I5022 Chronic systolic (congestive) heart failure: Secondary | ICD-10-CM

## 2017-04-14 DIAGNOSIS — I10 Essential (primary) hypertension: Secondary | ICD-10-CM

## 2017-04-14 DIAGNOSIS — R05 Cough: Secondary | ICD-10-CM

## 2017-04-14 DIAGNOSIS — M79602 Pain in left arm: Secondary | ICD-10-CM

## 2017-04-14 DIAGNOSIS — M79601 Pain in right arm: Secondary | ICD-10-CM

## 2017-04-14 DIAGNOSIS — I252 Old myocardial infarction: Secondary | ICD-10-CM | POA: Insufficient documentation

## 2017-04-14 MED ORDER — ACETAMINOPHEN ER 650 MG PO TBCR: 650.0000 mg | EXTENDED_RELEASE_TABLET | Freq: Three times a day (TID) | ORAL | 0 refills | Status: DC | PRN

## 2017-04-14 MED ORDER — METFORMIN HCL 500 MG PO TABS: 500.0000 mg | ORAL_TABLET | Freq: Every day | ORAL | 1 refills | Status: DC

## 2017-04-14 MED ORDER — BENZONATATE 100 MG PO CAPS
100.0000 mg | ORAL_CAPSULE | Freq: Two times a day (BID) | ORAL | 0 refills | Status: DC | PRN
Start: 2017-04-14 — End: 2017-10-28

## 2017-04-14 NOTE — Patient Instructions (Addendum)
Plan de alimentacin para la prediabetes (Prediabetes Eating Plan) La prediabetes, tambin llamada intolerancia a la glucosa o alteracin de la glucosa en ayunas, es una afeccin que eleva los niveles de azcar en la sangre (glucemia) por encima de lo normal. Seguir una dieta saludable puede ayudar a mantener la prediabetes bajo control, y tambin reduce el riesgo de tener diabetes tipo2 y cardiopata, que es ms alto en las personas que tienen esta afeccin. Junto con la actividad fsica habitual, una dieta saludable:  Promueve la prdida de [place].  Ayuda a Environmental consultant de Dispensing optician.  Ayuda a mejorar la forma en que el organismo [place] la insulina. QU DEBO SABER ACERCA DE ESTE PLAN DE [place]?  Use el ndice glucmico (IG) para planificar las comidas. El ndice le informa con qu rapidez un alimento elevar su nivel de azcar en la sangre. Elija los alimentos con bajo IG. Estos tardan ms tiempo en subir el nivel de azcar en la sangre.  Preste mucha atencin a la cantidad de hidratos de carbono que hay en los alimentos que consume. Los hidratos de carbono [employer] niveles de Dispensing optician.  Lleve un registro de la cantidad de caloras que ingiere. Ingerir la cantidad correcta de caloras lo ayudar a Development worker, international aid peso saludable. Bajar alrededor del 7por ciento del peso inicial puede ayudar a Product/process development scientist la diabetes tipo2.  Tal vez deba seguir Web designer. Esta incluye una gran cantidad de verduras, carnes magras o pescado, cereales integrales, frutas, as como aceites y grasas saludables.  QU ALIMENTOS PUEDO COMER? Cereales Cereales integrales, como panes, galletas, cereales y pastas de salvado o integrales. Avena sin azcar. Trigo burgol. Cebada. Quinua. Arroz integral. Tortillas o tacos de harina de maz o de salvado. [place] Valeda Malm. [place]. Guisantes. Remolachas. Coliflor. Repollo. Brcoli. Zanahorias. Tomates. [place]. [place]in [place].  Hierbas. Pimientos. Cebollas. Pepinos. Repollitos de Bruselas. Frutas Frutos rojos. Bananas. Manzanas. Naranjas. Uvas. Papaya. Mango. [place]. Kiwi. Pomelo. Cerezas. Carnes y otras fuentes de protenas [place]. Carnes [place], entre ellas, pollo y [place] o cortes magros de carne de cerdo y de [place]. Tofu. Huevos. Los frutos secos. Frijoles. Lcteos Productos lcteos descremados o semidescremados, como yogur, queso cottage y [place]. [employer]. T. Caf. Gaseosas sin azcar o dietticas. Agua de [place]. Leche. Productos alternativos [place], como leche de soja o de [place]. Condimentos [place]. Salsa de pepinillos. [place] con bajo contenido de [place] y de Location manager. Salsa barbacoa con bajo contenido de grasa y de azcar. [place]onesa sin grasa o con bajo contenido de [place]. Dulces y postres [place] sin azcar o con bajo contenido de [place]. Helados y otros dulces congelados sin azcar o con bajo contenido de [place]. Grasas y Medical laboratory scientific officer. Nueces. Aceite de oliva. Los artculos mencionados arriba pueden no ser [employer] de las bebidas o los alimentos recomendados. Comunquese con el nutricionista para conocer ms opciones. QU ALIMENTOS NO SE RECOMIENDAN? Cereales Productos a base de [place] y de [place], como panes, pastas, bocadillos y cereales. Bebidas Bebidas azucaradas, como t helado y gaseosas con Location manager. Dulces y postres Productos de [place], [place] tortas, [place], [place], Museum/gallery exhibitions officer y tarta de Rothschild. Los artculos mencionados arriba pueden no ser [employer] Company de las bebidas y los alimentos que se Higher education careers adviser. Comunquese con el nutricionista para obtener ms informacin. Esta informacin no tiene Marine scientist el consejo del mdico. Asegrese de hacerle al mdico cualquier pregunta que tenga. Document Released: 11/08/2014 Document Revised: 11/08/2014 Document Reviewed: 03/15/2014  Chartered certified accountant Patient Education  2017 [employer].  Diabetes mellitus y nutricin Diabetes Mellitus and Nutrition Si sufre de diabetes (diabetes mellitus), es muy importante tener hbitos alimenticios saludables debido a que sus niveles de Designer, television/film set sangre (glucosa) se ven afectados en gran medida por lo que come y bebe. [place]er alimentos saludables en las cantidades [place], aproximadamente a la [employer], [place] ayudar a:  Aeronautical engineer glucemia.  Disminuir el riesgo de sufrir una enfermedad cardaca.  Mejorar la presin arterial.  Science writer o mantener un peso saludable.  Todas las personas que sufren de diabetes son diferentes y cada una tiene necesidades diferentes en cuanto a un plan de alimentacin. El mdico puede recomendarle que trabaje con un especialista en dietas y nutricin (nutricionista) para Financial trader plan para usted. Su plan de alimentacin puede variar segn factores como:  Las caloras que necesita.  Los medicamentos que toma.  Su peso.  Sus niveles de glucemia, presin arterial y colesterol.  Su nivel de [place].  Otras afecciones que tenga, como enfermedades cardacas o renales.  Cmo me afectan los carbohidratos? Los carbohidratos afectan el nivel de glucemia ms que cualquier otro tipo de alimento. La ingesta de carbohidratos naturalmente aumenta la cantidad glucosa en la sangre. El recuento de carbohidratos es un mtodo destinado a Catering manager un registro de la cantidad de carbohidratos que se ingieren. El recuento de carbohidratos es importante para Theatre manager la glucemia a un nivel saludable, en especial si utiliza insulina o toma determinados medicamentos por va oral para la diabetes. Es importante saber la cantidad de carbohidratos que se pueden ingerir en cada comida sin correr Engineer, manufacturing. Esto es Psychologist, forensic. El nutricionista puede ayudarlo a calcular la cantidad de carbohidratos que debe ingerir en cada comida y colacin. Los alimentos que contienen carbohidratos  incluyen:  Pan, cereal, arroz, pasta y galletas.  Papas y maz.  Guisantes, frijoles y lentejas.  Leche y Estate agent.  Lambert Mody y [place].  Postres, como pasteles, galletitas, helado y caramelos.  Cmo me afecta el alcohol? El alcohol puede provocar disminuciones sbitas de la glucemia (hipoglucemia), en especial si utiliza insulina o toma determinados medicamentos por va oral para la diabetes. La hipoglucemia es una afeccin potencialmente mortal. Los sntomas de la hipoglucemia (somnolencia, mareos y confusin) son similares a los sntomas de haber consumido demasiado alcohol. Si el mdico afirma que el alcohol es seguro para usted, siga estas pautas:  Limite el consumo de alcohol a no ms de 1 medida por da si es mujer y no est Music therapist, y a 2 medidas si es hombre. Una medida equivale a 12oz (337ml) de cerveza, 5oz (158ml) de vino o 1oz (88ml) de bebidas de alta graduacin alcohlica.  No beba con el estmago vaco.  Mantngase hidratado con agua, gaseosas dietticas o t helado sin azcar.  Tenga en cuenta que las gaseosas comunes, los jugos y otros refrescos pueden contener mucha azcar y se deben contar como carbohidratos.  Consejos para seguir Photographer las etiquetas de los alimentos  Comience por controlar el tamao de la porcin en la etiqueta. La cantidad de caloras, carbohidratos, grasas y otros nutrientes mencionados en la etiqueta se basan en una porcin del alimento. Muchos alimentos contienen ms de una porcin por envase.  Verifique la cantidad total de gramos (g) de carbohidratos totales en una porcin. Puede calcular la cantidad de porciones de carbohidratos al dividir el total de carbohidratos por 15. Por ejemplo, si un alimento posee  un total de 30g de carbohidratos, equivale a 2 porciones de carbohidratos.  Verifique la cantidad de gramos (g) de grasas saturadas y grasas trans en una porcin. Escoja alimentos que no contengan grasa o que tengan un bajo  contenido.  Controle la cantidad de miligramos (mg) de sodio en una porcin. La mayora de las personas deben limitar la ingesta de sodio total a menos de 2300mg  por Training and development officer.  Siempre consulte la informacin nutricional de los alimentos etiquetados como "con bajo contenido de grasa" o "sin grasa". Estos alimentos pueden ser ms altos en azcar agregada o en carbohidratos refinados y deben evitarse.  Hable con el nutricionista para identificar sus objetivos diarios en cuanto a los nutrientes mencionados en la etiqueta. De compras  Evite comprar alimentos procesados, enlatados o prehechos. Estos alimentos tienden a TEFL teacher cantidad de [place], sodio y azcar agregada.  Compre en la zona exterior de la tienda de comestibles. Esta incluye frutas y Northrop [employer]umman, granos a granel, carnes frescas y productos lcteos frescos. Coccin  Utilice mtodos de coccin a baja temperatura, como hornear, en lugar de mtodos de coccin a alta temperatura, como frer en abundante aceite.  Cocine con aceites saludables, como el aceite de [place], canola o [place].  Evite cocinar con manteca, crema o carnes con alto contenido de grasa. Planificacin de las comidas  International Paper comidas y las colaciones de forma regular, preferentemente a la misma hora todos [place]. Evite pasar largos perodos de tiempo sin comer.  Consuma alimentos ricos en fibra, como frutas frescas, verduras, frijoles y cereales integrales. Consulte al nutricionista sobre cuntas porciones de carbohidratos puede consumir en cada comida.  Consuma entre 4 y 6 onzas de protenas magras por da, como carnes [place], pollo, pescado, First Data Corporation o tofu. 1 onza equivale a 1 onza de carne, pollo o pescado, 1 huevo, o 1/4 taza de tofu.  Coma algunos alimentos por da que contengan grasas saludables, como aguacates, frutos secos, semillas y pescado. Estilo de vida   Controle su nivel de glucemia con regularidad.  Haga ejercicio al menos 31minutos,  5das o ms por semana, o como se lo haya indicado el mdico.  Tome los Tenne[employer] Inc se lo haya indicado el mdico.  No consuma ningn producto que contenga nicotina o tabaco, como cigarrillos y Psychologist, sport and exercise. Si necesita ayuda para dejar de fumar, consulte al [employer] Corporation con un asesor o instructor en diabetes para identificar estrategias para controlar el estrs y cualquier desafo emocional y social. Cules son algunas de las preguntas que puedo hacerle a mi mdico?  Es necesario que me rena con Radio broadcast assistant en diabetes?  Es necesario que me rena con un nutricionista?  A qu nmero puedo llamar si tengo preguntas?  Cules son los mejores momentos para controlar la glucemia? Dnde encontrar ms informacin:  Asociacin Americana de la Diabetes (American Diabetes Association): diabetes.org/food-and-fitness/food  Academia de Nutricin y Information systems manager (Academy of Nutrition and Dietetics): PokerClues.dk  [place] Diabetes y [place] y Water quality scientist [facility] of Diabetes and Digestive and Kidney Diseases) ([place], NIH): ContactWire.be Resumen  Un plan de alimentacin saludable lo ayudar a Aeronautical engineer glucemia y Theatre manager un estilo de vida saludable.  Trabajar con un especialista en dietas y nutricin (nutricionista) puede ayudarlo a Insurance claims handler de alimentacin para usted.  Tenga en cuenta que los carbohidratos y el alcohol tienen efectos inmediatos en sus niveles de glucemia. Es importante contar los carbohidratos y consumir alcohol con prudencia.  Esta informacin no tiene Marine scientist el consejo del mdico. Asegrese de hacerle al mdico cualquier pregunta que tenga. Document Released: 05/27/2007 Document Revised: 06/09/2016 Document Reviewed:  06/09/2016 Elsevier Interactive Patient Education  2018 [employer].

## 2017-04-14 NOTE — Progress Notes (Signed)
Name: Lucas [name]   MRN: [id number]    DOB: 1956/01/01   Date:04/14/2017       Progress Note  Subjective  Chief Complaint  Chief Complaint  Patient presents with  . Arm Pain    right 5; left 6; inflammation    HPI  [employer]/product designer for Visit.  Pt has extensive cardiac history: - Sees Dr. Carlis Abbott FNP for Cardiology - follows up 04/29/2017.  Note from 03/17/2017 reviewed in detail, refills of medications in accordance with Ms. Hackney's recommendations will be provided today. - History of STEMI involving LAD: Occurred 01/25/2017. Saw Dr. Saunders Revel for heart cath 02/03/17.  Endorses occasional muscular pain but denies chest pain.  Taking medications as prescribed.  - CHF: Denies chest pain or BLE edema; does have shortness of breath in the mornings and with increased exertion (walking fast for >30 minutes).  Taking aspirin, carvedilol, spironolactone, potassium, lasix as prescribed. - HTN: Doing well, BP is at goal today.  Taking medications as prescribed. Denies headaches except with overexertion; no vision changes, no palpitations.  - Prediabetes: Elevated glucose readings multiple times in December 2018 - A1C checked 03/10/17 and was 6.3%; glucose was 143, GFR 96 on 03/10/17. Denies polyuria, polyphagia, or polydipsia.  We will start on Metformin 500mg  once daily. Discussed lifestyle modifications at length.   - Bilateral Arm Pain: BUE pain ever since he was hospitalized for his STEMI.  Has history myalgias, had lowered atorvastatin to 40mg  and was doing much better.  Says current pain is different in that it is now constant instead of intermittent.  He is on plavix, so we will avoid NSAIDS for the time being.  Has not taken any medications for his pain yet.  - Cough: Notes cough that started over a month ago.  He has been taking Delsym OTC for this which only some relief would like Rx today for this.  He denies chest pain or abnormal shortness of breath as above,  no BLE edema, no fevers/chills.  He denies regurgitation or heart burn, says the coughing is not worse after meals, but it does tend to worsen after he takes his Lisinopril.  Chest Xray was ordered at last visit, but he did not go - instructions provided and he will present for this when able.  If chest Xray normal, will consider trial off of ACE-I   Patient Active Problem List   Diagnosis Date Noted  . History of ST elevation myocardial infarction (STEMI) 04/14/2017  . Prediabetes 04/14/2017  . Chronic systolic heart failure ([place]) 02/02/2017  . HTN (hypertension) 02/02/2017  . Myalgia 02/02/2017  . STEMI involving left anterior descending coronary artery ([place]) 01/25/2017    Past Surgical History:  Procedure Laterality Date  . APPENDECTOMY    . CARDIAC CATHETERIZATION    . CORONARY ANGIOPLASTY    . CORONARY/GRAFT ACUTE MI REVASCULARIZATION N/A 01/25/2017   Procedure: Coronary/Graft Acute MI Revascularization;  Surgeon: Nelva Bush, MD;  Location: [facility];  Service: Cardiovascular;  Laterality: N/A;  . LEFT HEART CATH AND CORONARY ANGIOGRAPHY N/A 01/25/2017   Procedure: LEFT HEART CATH AND CORONARY ANGIOGRAPHY;  Surgeon: Nelva Bush, MD;  Location: [facility];  Service: Cardiovascular;  Laterality: N/A;    Family History  Problem Relation Age of Onset  . Heart disease Father        s/p pacemaker  . Arrhythmia Father   . Asthma Father   . Asthma Mother     Social History  Socioeconomic History  . Marital status: Married    Spouse name: Not on file  . Number of children: Not on file  . Years of education: Not on file  . Highest education level: Not on file  Social Needs  . Financial resource strain: Hard  . Food insecurity - worry: Never true  . Food insecurity - inability: Never true  . Transportation needs - medical: Not on file  . Transportation needs - non-medical: Not on file  Occupational History  . Not on file  Tobacco Use  .  Smoking status: Never Smoker  . Smokeless tobacco: Never Used  Substance and Sexual Activity  . Alcohol use: No    Frequency: Never  . Drug use: No  . Sexual activity: Yes    Birth control/protection: Coitus interruptus, None  Other Topics Concern  . Not on file  Social History Narrative  . Not on file     Current Outpatient Medications:  .  aspirin 81 MG chewable tablet, Chew 1 tablet (81 mg total) by mouth daily., Disp: 90 tablet, Rfl: 1 .  atorvastatin (LIPITOR) 80 MG tablet, Take 0.5 tablets (40 mg total) by mouth daily., Disp: 90 tablet, Rfl: 0 .  carvedilol (COREG) 3.125 MG tablet, Take 1 tablet (3.125 mg total) by mouth 2 (two) times daily with a meal., Disp: 180 tablet, Rfl: 0 .  clopidogrel (PLAVIX) 75 MG tablet, Take 1 tablet (75 mg total) by mouth daily. Begin the day after taking 300mg  Clopidogrel., Disp: 90 tablet, Rfl: 0 .  furosemide (LASIX) 20 MG tablet, Take 1 tablet (20 mg total) by mouth daily., Disp: 90 tablet, Rfl: 0 .  lisinopril (PRINIVIL,ZESTRIL) 5 MG tablet, Take 1 tablet (5 mg total) by mouth daily. (Patient taking differently: Take 2.5 mg by mouth daily. ), Disp: 90 tablet, Rfl: 3 .  potassium chloride (K-DUR,KLOR-CON) 10 MEQ tablet, Take 1 tablet (10 mEq total) by mouth daily., Disp: 90 tablet, Rfl: 0 .  spironolactone (ALDACTONE) 25 MG tablet, Take 0.5 tablets (12.5 mg total) by mouth daily., Disp: 45 tablet, Rfl: 0 .  ticagrelor (BRILINTA) 90 MG TABS tablet, Take 1 tablet (90 mg total) by mouth 2 (two) times daily., Disp: 60 tablet, Rfl: 0  No Known Allergies  ROS Ten systems reviewed and is negative except as mentioned in HPI.  Objective  Vitals:   04/14/17 1922  BP: 123/76  Pulse: 64  Temp: 98.2 F (36.8 C)  Weight: 149 lb 6.4 oz (67.8 kg)    Body mass index is 29.18 kg/m.  Physical Exam Constitutional: Patient appears well-developed and well-nourished. No distress.  HENT: Head: Normocephalic and atraumatic. Neck: Normal range of motion.  Neck supple. No JVD present. No thyromegaly present.  Cardiovascular: Normal rate, regular rhythm and normal heart sounds.  No murmur heard. No BLE edema. Pulmonary/Chest: Effort normal and breath sounds normal. No respiratory distress. Musculoskeletal: Normal range of motion, no joint effusions. No gross deformities Neurological: he is alert and oriented to person, place, and time. No cranial nerve deficit. Coordination, balance, strength, speech and gait are normal.  Skin: Skin is warm and dry. No rash noted. No erythema.  Psychiatric: Patient has a normal mood and affect. behavior is normal. Judgment and thought content normal.  No results found for this or any previous visit (from the past 72 hour(s)).  PHQ2/9: Depression screen [facility] 2/9 03/17/2017 03/17/2017 02/02/2017  Decreased Interest 0 0 0  Down, Depressed, Hopeless 1 0 0  PHQ - 2 Score 1  0 0   Fall Risk: Fall Risk  03/17/2017 02/02/2017  Falls in the past year? No No   Assessment & Plan  1. Prediabetes - metFORMIN (GLUCOPHAGE) 500 MG tablet; Take 1 tablet (500 mg total) by mouth daily with breakfast.  Dispense: 90 tablet; Refill: 1 - Lifestyle modifications discussed in detail. Side effects of metformin discussed - recommend taking with food.  2. Chronic systolic heart failure ([place]) 3. Essential hypertension 4. History of ST elevation myocardial infarction (STEMI) - Stable; no refills needed today.  Keep follow up with Dr. Darnelle Bos office as scheduled.  5. Cough - benzonatate (TESSALON) 100 MG capsule; Take 1 capsule (100 mg total) by mouth 2 (two) times daily as needed for cough.  Dispense: 30 capsule; Refill: 0 - chest Xray is ordered - pt is reminded to go to have this completed; will consider trial off of ACE-I if CXR is negative.  6. Pain in both upper extremities - acetaminophen (ACETAMINOPHEN 8 HOUR) 650 MG CR tablet; Take 1 tablet (650 mg total) by mouth every 8 (eight) hours as needed for pain.  Dispense: 30 tablet;  Refill: 0 - Advised to trial tylenol PRN for pain, restm gentle stretching.

## 2017-04-29 ENCOUNTER — Ambulatory Visit (INDEPENDENT_AMBULATORY_CARE_PROVIDER_SITE_OTHER): Payer: Self-pay | Admitting: Internal Medicine

## 2017-04-29 ENCOUNTER — Encounter: Payer: Self-pay | Admitting: Internal Medicine

## 2017-04-29 VITALS — BP 110/80 | HR 67 | Ht 60.0 in | Wt 149.0 lb

## 2017-04-29 DIAGNOSIS — E785 Hyperlipidemia, unspecified: Secondary | ICD-10-CM | POA: Insufficient documentation

## 2017-04-29 DIAGNOSIS — I2510 Atherosclerotic heart disease of native coronary artery without angina pectoris: Secondary | ICD-10-CM

## 2017-04-29 DIAGNOSIS — I5022 Chronic systolic (congestive) heart failure: Secondary | ICD-10-CM

## 2017-04-29 DIAGNOSIS — I255 Ischemic cardiomyopathy: Secondary | ICD-10-CM | POA: Insufficient documentation

## 2017-04-29 MED ORDER — FUROSEMIDE 20 MG PO TABS: 20.0000 mg | ORAL_TABLET | Freq: Every day | ORAL | 3 refills | Status: DC

## 2017-04-29 MED ORDER — LOSARTAN POTASSIUM 25 MG PO TABS: 25.0000 mg | ORAL_TABLET | Freq: Every day | ORAL | 3 refills | Status: DC

## 2017-04-29 NOTE — Patient Instructions (Signed)
Medication Instructions:  Your physician has recommended you make the following change in your medication:  1- STOP Lisinopril. 2- START Losartan 25 mg  (1 tablet) by mouth once a day. 3- TAKE Furosemide 20 mg  (1 tablet) by mouth once a day.   Labwork: Your physician recommends that you return for lab work in: 10-14 days. Same morning before your appointment at the [facility] (Lipid, [facility]). - Please go to the [facility]. You will check in at the front desk to the right as you walk into the atrium. Valet Parking is offered if needed. - You will need to be FASTING. - No appointment needed.     Testing/Procedures: none  Follow-Up: Your physician recommends that you schedule a follow-up appointment in: 10-14 days with APP (looks like march 11 or 12)   If you need a refill on your cardiac medications before your next appointment, please call your pharmacy.

## 2017-04-29 NOTE — Progress Notes (Signed)
Follow-up Outpatient Visit Date: 04/29/2017  Primary Care Provider: Patient, [name] No address on file  Chief Complaint: Cough  HPI:  Lucas [name] is a 62 y.o. year-old male with history of CAD with late presenting anterior STEMI (01/2017) status post PCI to the LAD, ischemic cardiomyopathy with chronic systolic heart failure, and borderline diabetes mellitus, who presents for follow-up of coronary artery disease and heart failure.  I last saw Lucas [name] in early December.  His only complaint at our follow-up visit was of myalgias and subjective fevers that he attributed to atorvastatin.  He had already noticed some improvement with decreasing the dose from 80 to 40 mg daily.  Ticagrelor was subsequently also switched to clopidogrel due to affordability issues.  He was seen in the heart failure clinic by Darylene Price, NP, last month.  No medication changes were made at that time.  Today, Lucas [name]'s primary complaint is of a cough over the last few weeks.  His most pronounced when he lies down at night.  He notes small amount of white sputum production.  Often, he will cough to the point of gagging.  This also causes some discomfort along his chest wall.  He otherwise, has not had any further chest pain.  He notes some discomfort in his upper arms when lifting things or doing repetitive tasks with his arms.  He denies palpitations, lightheadedness, orthopnea, PND, and edema.  He has not noticed any bleeding, remaining on aspirin and clopidogrel.  He is concerned that his cough could be due to medications, particularly atorvastatin and carvedilol, which he takes in the evenings.  --------------------------------------------------------------------------------------------------  Cardiovascular History & Procedures: Cardiovascular Problems:  Coronary artery disease with a late presenting anterior STEMI (01/2017)  Ischemic cardiomyopathy  Risk Factors:  Known coronary artery  disease, male gender, and age greater than 42  Cath/PCI:  LHC/PCI (01/25/17): LMCA with 20% distal stenosis.  LAD with 20% ostial disease followed by thrombotic occlusion of the proximal vessel.  25% mid LAD disease and 50% ostial D1 stenosis present.  LCx normal.  RCA with 30% proximal stenosis.  Successful PCI to the proximal LAD with placement of a Xience Sierra 3.0 x 23 mm drug-eluting stent postdilated with a 3.5 mm Irion balloon at high pressure.  CV Surgery:  None  EP Procedures and Devices:  None  Non-Invasive Evaluation(s):  TTE (01/26/17): Normal LV size with moderate LVH.  LVEF 30-35% with severe anterior, anteroseptal, and apical hypokinesis.  Grade 1 diastolic dysfunction.  Mild MR.  Normal RV size and function.  Recent CV Pertinent Labs: Lab Results  Component Value Date   CHOL 184 01/25/2017   HDL 44 01/25/2017   LDLCALC 114 (H) 01/25/2017   TRIG 132 01/25/2017   CHOLHDL 4.2 01/25/2017   INR 1.05 01/25/2017   K 4.1 03/10/2017   MG 2.0 01/28/2017   BUN 18 03/10/2017   CREATININE 0.84 03/10/2017    Past medical and surgical history were reviewed and updated in EPIC.  Current Meds  Medication Sig  . acetaminophen (ACETAMINOPHEN 8 HOUR) 650 MG CR tablet Take 1 tablet (650 mg total) by mouth every 8 (eight) hours as needed for pain.  Marland Kitchen aspirin 81 MG chewable tablet Chew 1 tablet (81 mg total) by mouth daily.  Marland Kitchen atorvastatin (LIPITOR) 80 MG tablet Take 0.5 tablets (40 mg total) by mouth daily.  . benzonatate (TESSALON) 100 MG capsule Take 1 capsule (100 mg total) by mouth 2 (two) times daily as needed for  cough.  . carvedilol (COREG) 3.125 MG tablet Take 1 tablet (3.125 mg total) by mouth 2 (two) times daily with a meal.  . clopidogrel (PLAVIX) 75 MG tablet Take 1 tablet (75 mg total) by mouth daily. Begin the day after taking 300mg  Clopidogrel.  . furosemide (LASIX) 20 MG tablet Take 1 tablet (20 mg total) by mouth daily.  Marland Kitchen lisinopril (PRINIVIL,ZESTRIL) 5 MG  tablet Take 1 tablet (5 mg total) by mouth daily.  . metFORMIN (GLUCOPHAGE) 500 MG tablet Take 1 tablet (500 mg total) by mouth daily with breakfast.  . potassium chloride (K-DUR,KLOR-CON) 10 MEQ tablet Take 1 tablet (10 mEq total) by mouth daily.  Marland Kitchen spironolactone (ALDACTONE) 25 MG tablet Take 0.5 tablets (12.5 mg total) by mouth daily.  . [DISCONTINUED] ticagrelor (BRILINTA) 90 MG TABS tablet Take 1 tablet (90 mg total) by mouth 2 (two) times daily.    Allergies: Patient has no known allergies.  Social History   Socioeconomic History  . Marital status: Married    Spouse name: Not on file  . Number of children: Not on file  . Years of education: Not on file  . Highest education level: Not on file  Social Needs  . Financial resource strain: Hard  . Food insecurity - worry: Never true  . Food insecurity - inability: Never true  . Transportation needs - medical: Not on file  . Transportation needs - non-medical: Not on file  Occupational History  . Not on file  Tobacco Use  . Smoking status: Never Smoker  . Smokeless tobacco: Never Used  Substance and Sexual Activity  . Alcohol use: No    Frequency: Never  . Drug use: No  . Sexual activity: Yes    Birth control/protection: Coitus interruptus, None  Other Topics Concern  . Not on file  Social History Narrative  . Not on file    Family History  Problem Relation Age of Onset  . Heart disease Father        s/p pacemaker  . Arrhythmia Father   . Asthma Father   . Asthma Mother     Review of Systems: A 12-system review of systems was performed and was negative except as noted in the HPI.  --------------------------------------------------------------------------------------------------  Physical Exam: BP 110/80 (BP Location: Left Arm, Patient Position: Sitting, Cuff Size: Normal)   Pulse 67   Ht 5' (1.524 m)   Wt 149 lb (67.6 kg)   BMI 29.10 kg/m   General: Overweight man, seated comfortably in the exam room.  He  is accompanied by his son and a Spanish interpreter. HEENT: No conjunctival pallor or scleral icterus. Moist mucous membranes.  OP clear. Neck: Supple without lymphadenopathy or thyromegaly.  JVP is approximately 8 cm with positive HJR. Lungs: Normal work of breathing. Clear to auscultation bilaterally without wheezes or crackles. Heart: Regular rate and rhythm without murmurs, rubs, or gallops. Non-displaced PMI. Abd: Bowel sounds present. Soft, NT/ND without hepatosplenomegaly Ext: Pretibial edema bilaterally. Radial, PT, and DP pulses are 2+ bilaterally. Skin: Warm and dry without rash.  EKG: Normal sinus rhythm with sinus arrhythmia, low voltage, and old anterior MI.  Lab Results  Component Value Date   WBC 8.5 01/28/2017   HGB 14.8 01/28/2017   HCT 43.7 01/28/2017   MCV 89.8 01/28/2017   PLT 195 01/28/2017    Lab Results  Component Value Date   NA 140 03/10/2017   K 4.1 03/10/2017   CL 100 03/10/2017   CO2 21 03/10/2017  BUN 18 03/10/2017   CREATININE 0.84 03/10/2017   GLUCOSE 143 (H) 03/10/2017   ALT 49 01/25/2017    Lab Results  Component Value Date   CHOL 184 01/25/2017   HDL 44 01/25/2017   LDLCALC 114 (H) 01/25/2017   TRIG 132 01/25/2017   CHOLHDL 4.2 01/25/2017    --------------------------------------------------------------------------------------------------  ASSESSMENT AND PLAN: Coronary artery disease without angina No chest pain to suggest worsening coronary insufficiency following late presenting STEMI with primary PCI in 01/2017.  I do not believe that bilateral arm pain represents an anginal equivalent.  I be more concerned for myalgias relating to high intensity statin.  We will continue with at least 12 months of aspirin and clopidogrel.  I will recheck a fasting lipid panel to see his response.  If his LDL is less than 70, we could consider de-escalation of atorvastatin.  Chronic systolic heart failure secondary to ischemic cardiomyopathy Mr.  Jeralene [name] appears slightly volume overloaded on exam today, with trace pedal edema and mildly elevated JVP.  I also wonder if his cough may reflect pulmonary venous hypertension.  Alternative consideration would be side effect from lisinopril.  I am less concerned that atorvastatin or carvedilol are contributing to his cough.  We have agreed to switch lisinopril to losartan 25 mg daily.  I will also initiate furosemide 20 mg daily.  I will have Lucas [name] return for reevaluation and labs in 2 weeks.  We will need to repeat a limited echocardiogram for reassessment of LV systolic function in the next month or 2.  However, I would like to optimize his heart failure therapy before doing this.  Hyperlipidemia LDL at the time of STEMI was suboptimal at 114.  As noted above, I am concerned that his arm discomfort with certain activities could be a manifestation of statin therapy.  We will check a lipid panel when he returns for follow-up with the hope of being able to decrease atorvastatin as his LDL allows.  Alternatively, he could be switched to an alternative agent such as rosuvastatin.  Follow-up: Return to clinic in 10-14 days with stat CMP and lipid panel prior to visit.  Nelva Bush, MD 04/29/2017 4:27 PM

## 2017-04-30 NOTE — Addendum Note (Signed)
Addended by: Britt Bottom on: 04/30/2017 09:35 AM   Modules accepted: Orders

## 2017-05-11 ENCOUNTER — Encounter: Payer: Self-pay | Admitting: Nurse Practitioner

## 2017-05-11 ENCOUNTER — Ambulatory Visit (INDEPENDENT_AMBULATORY_CARE_PROVIDER_SITE_OTHER): Payer: Self-pay | Admitting: Nurse Practitioner

## 2017-05-11 ENCOUNTER — Other Ambulatory Visit
Admission: RE | Admit: 2017-05-11 | Discharge: 2017-05-11 | Disposition: A | Discharge status: Home / Self Care | Payer: Self-pay | Source: Ambulatory Visit | Attending: Internal Medicine | Admitting: Internal Medicine

## 2017-05-11 VITALS — BP 119/78 | HR 62 | Ht 60.0 in | Wt 145.5 lb

## 2017-05-11 DIAGNOSIS — I25119 Atherosclerotic heart disease of native coronary artery with unspecified angina pectoris: Secondary | ICD-10-CM

## 2017-05-11 DIAGNOSIS — E785 Hyperlipidemia, unspecified: Secondary | ICD-10-CM | POA: Insufficient documentation

## 2017-05-11 DIAGNOSIS — I5022 Chronic systolic (congestive) heart failure: Secondary | ICD-10-CM

## 2017-05-11 DIAGNOSIS — I255 Ischemic cardiomyopathy: Secondary | ICD-10-CM

## 2017-05-11 DIAGNOSIS — I10 Essential (primary) hypertension: Secondary | ICD-10-CM

## 2017-05-11 DIAGNOSIS — E119 Type 2 diabetes mellitus without complications: Secondary | ICD-10-CM

## 2017-05-11 DIAGNOSIS — I2510 Atherosclerotic heart disease of native coronary artery without angina pectoris: Secondary | ICD-10-CM | POA: Insufficient documentation

## 2017-05-11 LAB — COMPREHENSIVE METABOLIC PANEL
ALT: 28 U/L (ref 17–63)
AST: 22 U/L (ref 15–41)
Albumin: 4.6 g/dL (ref 3.5–5.0)
Alkaline Phosphatase: 58 U/L (ref 38–126)
Anion gap: 9 (ref 5–15)
BILIRUBIN TOTAL: 0.9 mg/dL (ref 0.3–1.2)
BUN: 16 mg/dL (ref 6–20)
CHLORIDE: 101 mmol/L (ref 101–111)
CO2: 27 mmol/L (ref 22–32)
CREATININE: 1.01 mg/dL (ref 0.61–1.24)
Calcium: 9.1 mg/dL (ref 8.9–10.3)
GFR calc Af Amer: >60 mL/min (ref >60)
GFR calc non Af Amer: >60 mL/min (ref >60)
Glucose, Bld: 103 mg/dL — ABNORMAL HIGH (ref 65–99)
Potassium: 4.8 mmol/L (ref 3.5–5.1)
Sodium: 137 mmol/L (ref 135–145)
TOTAL PROTEIN: 7.6 g/dL (ref 6.5–8.1)

## 2017-05-11 LAB — LIPID PANEL
CHOLESTEROL: 81 mg/dL (ref 0–200)
HDL: 29 mg/dL — ABNORMAL LOW (ref >40)
LDL Cholesterol: 29 mg/dL (ref 0–99)
Total CHOL/HDL Ratio: 2.8 RATIO
Triglycerides: 117 mg/dL (ref <150)
VLDL: 23 mg/dL (ref 0–40)

## 2017-05-11 NOTE — Progress Notes (Signed)
Office Visit    Patient Name: Lucas [name] Date of Encounter: 05/11/2017  Primary Care Provider:  Patient, [name] Primary Cardiologist:  Nelva Bush, MD  Chief Complaint    62 year old male with a history of CAD status post anterior STEMI and LAD stenting in November 2018, hypertension, hyperlipidemia, ischemic cardiomyopathy with an EF of 30-35%, combined CHF, and type 2 diabetes mellitus, who presents for follow-up.  Past Medical History    Past Medical History:  Diagnosis Date  . Acute ST elevation myocardial infarction (STEMI) involving left anterior descending (LAD) coronary artery ([place])    a. 01/2017 Ant STEMI/PCI: LAD 20ost, 100p (3.0x23 Xience Sierra DES).  . CAD (coronary artery disease)    a. 01/2017 Ant STEMI/PCI: LM 20d, LAD 20ost, 100p (3.0x23 Xience Sierra DES), 13m, D1 50ost, LCX nl, RCA 30p.  . Chronic combined systolic (congestive) and diastolic (congestive) heart failure ([place])    a. 01/2018 Echo: EF 30-35%, sev antsept, ant, apical HK. Gr1 DD, mild MR.  . Essential hypertension   . Hyperlipidemia    a. Myalgias with high dose lipitor - tolerating 40mg  daily.  . Ischemic cardiomyopathy    a. 01/2018 Echo: EF 30-35%.  . Type II diabetes mellitus ([place])    Past Surgical History:  Procedure Laterality Date  . APPENDECTOMY    . CARDIAC CATHETERIZATION    . CORONARY ANGIOPLASTY    . CORONARY/GRAFT ACUTE MI REVASCULARIZATION N/A 01/25/2017   Procedure: Coronary/Graft Acute MI Revascularization;  Surgeon: Nelva Bush, MD;  Location: [facility];  Service: Cardiovascular;  Laterality: N/A;  . LEFT HEART CATH AND CORONARY ANGIOGRAPHY N/A 01/25/2017   Procedure: LEFT HEART CATH AND CORONARY ANGIOGRAPHY;  Surgeon: Nelva Bush, MD;  Location: [facility];  Service: Cardiovascular;  Laterality: N/A;    Allergies  No Known Allergies  History of Present Illness    62 year old male with the above past medical history  including coronary artery disease status post late presenting anterior ST segment elevation myocardial infarction in November 2018 with thrombotic occlusion of the LAD, requiring drug-eluting stent placement.  He otherwise had nonobstructive disease.  Other history includes ischemic cardiomyopathy and chronic combined systolic and diastolic congestive heart failure with an EF of 30-35% and grade 1 diastolic dysfunction, hypertension, hyperlipidemia, and type 2 diabetes mellitus: Patient has been followed closely in the outpatient setting and was last seen on February 27.  At that time, he reported a cough and his lisinopril was discontinued in favor of losartan.  He is also been having issues with arm discomfort, especially with repetitive tasks or lifting of his arms.  He was also noted to have mild volume overload and was placed on low-dose Lasix.  Since his last visit, [name] has been relatively stable.  He is walking 30 minutes daily and generally does not experience any symptoms or limitations.  He says that if he pushes himself harder, he will experience dyspnea on exertion and perhaps mild chest tightness.  Both of these symptoms have been stable since November.  He believes his cough has improved significantly since coming off of lisinopril.  He continues to have left shoulder discomfort when using his left arm.  This pain is not present unless he is using his arms.  He has responded well to low-dose Lasix and denies PND, orthopnea, palpitations, dizziness, syncope, edema, or early satiety.  Of note, interpreter is present today and assisting with the interview.  Home Medications    Prior  to Admission medications   Medication Sig Start Date End Date Taking? Authorizing Provider  acetaminophen (ACETAMINOPHEN 8 HOUR) 650 MG CR tablet Take 1 tablet (650 mg total) by mouth every 8 (eight) hours as needed for pain. 04/14/17  Yes Hubbard Hartshorn, FNP  aspirin 81 MG chewable tablet Chew 1 tablet (81  mg total) by mouth daily. 03/11/17  Yes Hubbard Hartshorn, FNP  atorvastatin (LIPITOR) 80 MG tablet Take 0.5 tablets (40 mg total) by mouth daily. 03/10/17  Yes Hubbard Hartshorn, FNP  benzonatate (TESSALON) 100 MG capsule Take 1 capsule (100 mg total) by mouth 2 (two) times daily as needed for cough. 04/14/17  Yes Hubbard Hartshorn, FNP  carvedilol (COREG) 3.125 MG tablet Take 1 tablet (3.125 mg total) by mouth 2 (two) times daily with a meal. 03/11/17  Yes Hubbard Hartshorn, FNP  clopidogrel (PLAVIX) 75 MG tablet Take 1 tablet (75 mg total) by mouth daily. Begin the day after taking 300mg  Clopidogrel. 03/11/17  Yes Hubbard Hartshorn, FNP  furosemide (LASIX) 20 MG tablet Take 1 tablet (20 mg total) by mouth daily. 04/29/17 07/28/17 Yes End, Harrell Gave, MD  losartan (COZAAR) 25 MG tablet Take 1 tablet (25 mg total) by mouth daily. 04/29/17 07/28/17 Yes End, Harrell Gave, MD  metFORMIN (GLUCOPHAGE) 500 MG tablet Take 1 tablet (500 mg total) by mouth daily with breakfast. 04/14/17  Yes Hubbard Hartshorn, FNP  potassium chloride (K-DUR,KLOR-CON) 10 MEQ tablet Take 1 tablet (10 mEq total) by mouth daily. 03/11/17  Yes Hubbard Hartshorn, FNP  spironolactone (ALDACTONE) 25 MG tablet Take 0.5 tablets (12.5 mg total) by mouth daily. 03/11/17  Yes Hubbard Hartshorn, FNP    Review of Systems    He does have some degree of dyspnea on exertion and mild chest tightness at higher levels of activity.  The symptoms have been stable since his myocardial infarction in November.  He is walking 30 minutes daily without significant limitations at a moderate pace.  He has had shoulder pain with movement of his arms.  He denies palpitations, PND, orthopnea, dizziness, syncope, edema, or early satiety.  All other systems reviewed and are otherwise negative except as noted above.  Physical Exam    VS:  BP 119/78 (BP Location: Left Arm, Patient Position: Sitting, Cuff Size: Normal)   Pulse 62   Ht 5' (1.524 m)   Wt 145 lb 8 oz (66 kg)   BMI 28.42 kg/m  , BMI  Body mass index is 28.42 kg/m. GEN: Well nourished, well developed, in no acute distress.  HEENT: normal.  Neck: Supple, no JVD, carotid bruits, or masses. Cardiac: RRR, 1/6 systolic murmur at the upper sternal border, no rubs, or gallops. No clubbing, cyanosis, edema.  Radials/DP/PT 2+ and equal bilaterally.  Respiratory:  Respirations regular and unlabored, clear to auscultation bilaterally. GI: Soft, nontender, nondistended, BS + x 4. MS: no deformity or atrophy. Skin: warm and dry, no rash. Psych: Normal affect.  Accessory Clinical Findings    ECG -regular sinus rhythm, 62, prior septal infarct no acute changes.  Lab Results  Component Value Date   CREATININE 1.01 05/11/2017   BUN 16 05/11/2017   NA 137 05/11/2017   K 4.8 05/11/2017   CL 101 05/11/2017   CO2 27 05/11/2017   Lab Results  Component Value Date   CHOL 81 05/11/2017   HDL 29 (L) 05/11/2017   LDLCALC 29 05/11/2017   TRIG 117 05/11/2017   CHOLHDL 2.8 05/11/2017  Lab Results  Component Value Date   ALT 28 05/11/2017   AST 22 05/11/2017   ALKPHOS 58 05/11/2017   BILITOT 0.9 05/11/2017     Assessment & Plan    1.  Coronary artery disease: Status post prior anterior infarct and drug-eluting stent placement to the LAD in November 2018.  Patient has been stable since his last visit and if anything slightly improved.  He is walking 30 minutes daily without any significant limitations but does note that if he increases his pain significantly he will experience some degree of dyspnea and may be even mild chest tightness.  The symptom has been stable since November.  He remains on aspirin, statin, beta-blocker, Plavix, and ARB therapy.  2.  Ischemic cardiomyopathy/chronic combined systolic and diastolic congestive heart failure: EF 30-35% by echocardiogram in November 2019.  He had grade 1 diastolic dysfunction.  He was mildly volume overloaded at his last visit was placed on low-dose Lasix therapy.  He is down 4  pounds since that visit and notes that breathing has improved some.  He is euvolemic on exam.  He remains on beta-blocker, ARB, and Spironolactone therapy.  He had lab work drawn today which has just come [name] showing stable renal function and electrolytes.  Continue current therapy.  I am not sure that he would be able to afford Entresto.  I will arrange for a follow-up limited echo to reevaluate LV function as it has been greater than 3 months since his event and he is now euvolemic.  3.  Hyperlipidemia: LDL at the time of his MI was 114.  He developed myalgias on Lipitor 80 mg but is tolerating 40 mg better.  He notes predominantly left shoulder discomfort when he moves his left arm but does not have discomfort at baseline.  Not clear what role statin may be playing-question arthritis.  LDL this morning was 29 with normal LFTs.  I will continue Lipitor 40 mg.  4.  Essential hypertension: Stable on beta-blocker, ARB, and diuretic therapy.  5.  DM II:  On metformin.  A1c 6.3 in 03/2017.  6.  Disposition: Follow-up limited echo.  Follow-up in clinic in 1 month or sooner if necessary.  Murray Hodgkins, NP 05/11/2017, 4:30 PM

## 2017-05-11 NOTE — Patient Instructions (Signed)
Medication Instructions:  Your physician recommends that you continue on your current medications as directed. Please refer to the Current Medication list given to you today.   Labwork: NONE  Testing/Procedures: Your physician has requested that you have an echocardiogram. Echocardiography is a painless test that uses sound waves to create images of your heart. It provides your doctor with information about the size and shape of your heart and how well your heart's chambers and valves are working. This procedure takes approximately one hour. There are no restrictions for this procedure.    Follow-Up: Your physician recommends that you schedule a follow-up appointment in: [place].  If you need a refill on your cardiac medications before your next appointment, please call your pharmacy.   Echocardiogram An echocardiogram, or echocardiography, uses sound waves (ultrasound) to produce an image of your heart. The echocardiogram is simple, painless, obtained within a short period of time, and offers valuable information to your health care provider. The images from an echocardiogram can provide information such as:  Evidence of coronary artery disease (CAD).  Heart size.  Heart muscle function.  Heart valve function.  Aneurysm detection.  Evidence of a past heart attack.  Fluid buildup around the heart.  Heart muscle thickening.  Assess heart valve function.  Tell a health care provider about:  Any allergies you have.  All medicines you are taking, including vitamins, herbs, eye drops, creams, and over-the-counter medicines.  Any problems you or family members have had with anesthetic medicines.  Any blood disorders you have.  Any surgeries you have had.  Any medical conditions you have.  Whether you are pregnant or may be pregnant. What happens before the procedure? No special preparation is needed. Eat and drink normally. What happens during  the procedure?  In order to produce an image of your heart, gel will be applied to your chest and a wand-like tool (transducer) will be moved over your chest. The gel will help transmit the sound waves from the transducer. The sound waves will harmlessly bounce off your heart to allow the heart images to be captured in real-time motion. These images will then be recorded.  You may need an IV to receive a medicine that improves the quality of the pictures. What happens after the procedure? You may return to your normal schedule including diet, activities, and medicines, unless your health care provider tells you otherwise. This information is not intended to replace advice given to you by your health care provider. Make sure you discuss any questions you have with your health care provider. Document Released: 02/15/2000 Document Revised: 10/06/2015 Document Reviewed: 10/25/2012 Elsevier Interactive Patient Education  2017 [employer].

## 2017-05-29 ENCOUNTER — Other Ambulatory Visit: Payer: Self-pay

## 2017-05-29 ENCOUNTER — Ambulatory Visit (INDEPENDENT_AMBULATORY_CARE_PROVIDER_SITE_OTHER): Payer: Self-pay

## 2017-05-29 DIAGNOSIS — I5022 Chronic systolic (congestive) heart failure: Secondary | ICD-10-CM

## 2017-05-29 DIAGNOSIS — I255 Ischemic cardiomyopathy: Secondary | ICD-10-CM

## 2017-06-12 NOTE — Progress Notes (Signed)
Patient ID: Karas Pickerill, male    DOB: 01-Jun-1955, 62 y.o.   MRN: [id number]  HPI  Entire visit done with the [facility] interpreter present.  [name] is a 62 y/o male with a history of STEMI, HTN and heart failure.  Echo report on 05/29/17 reviewed and showed an EF of 40-45% along with mild MR. Echo report done 01/26/17 reviewed and shows an EF of 30-35% along with mild MR. Cardiac catheterization done 01/25/17 showed mild to moderate non-obstructive disease involving the mid/distal LAD and RCA. Moderately elevated left ventricular filling pressure. 100% thrombotic occlusion of the proximal LAD. DES was successfully placed.   Admitted 01/25/17 due to a late presenting STEMI. Had acute plaque rupture and 100% thrombotic occlusion with successful DES. Troponin peaked at >65. Aspirin and brilinta for at least 12 months. Lifevest applied with decision of a repeat echo in 3 months. Discharged home after 3 days.   He presents today for a follow-up visit with a chief complaint of mild shortness of breath upon moderate exertion. He describes this as chronic having been present for several months. He has associated fatigue and occasional light-headedness along with this. He denies chest pain, edema, palpitations, abdominal distention, difficulty sleeping or weight gain. Does not check his glucose.  Past Medical History:  Diagnosis Date  . Acute ST elevation myocardial infarction (STEMI) involving left anterior descending (LAD) coronary artery ([place])    a. 01/2017 Ant STEMI/PCI: LAD 20ost, 100p (3.0x23 Xience Sierra DES).  . CAD (coronary artery disease)    a. 01/2017 Ant STEMI/PCI: LM 20d, LAD 20ost, 100p (3.0x23 Xience Sierra DES), 103m, D1 50ost, LCX nl, RCA 30p.  . Chronic combined systolic (congestive) and diastolic (congestive) heart failure ([place])    a. 01/2018 Echo: EF 30-35%, sev antsept, ant, apical HK. Gr1 DD, mild MR.  . Essential hypertension   . Hyperlipidemia    a. Myalgias with high  dose lipitor - tolerating 40mg  daily.  . Ischemic cardiomyopathy    a. 01/2018 Echo: EF 30-35%.  . Type II diabetes mellitus ([place])    Past Surgical History:  Procedure Laterality Date  . APPENDECTOMY    . CARDIAC CATHETERIZATION    . CORONARY ANGIOPLASTY    . CORONARY/GRAFT ACUTE MI REVASCULARIZATION N/A 01/25/2017   Procedure: Coronary/Graft Acute MI Revascularization;  Surgeon: Nelva Bush, MD;  Location: [facility];  Service: Cardiovascular;  Laterality: N/A;  . LEFT HEART CATH AND CORONARY ANGIOGRAPHY N/A 01/25/2017   Procedure: LEFT HEART CATH AND CORONARY ANGIOGRAPHY;  Surgeon: Nelva Bush, MD;  Location: [facility];  Service: Cardiovascular;  Laterality: N/A;   Family History  Problem Relation Age of Onset  . Heart disease Father        s/p pacemaker  . Arrhythmia Father   . Asthma Father   . Asthma Mother    Social History   Tobacco Use  . Smoking status: Never Smoker  . Smokeless tobacco: Never Used  Substance Use Topics  . Alcohol use: No    Frequency: Never   No Known Allergies  Prior to Admission medications   Medication Sig Start Date End Date Taking? Authorizing Provider  aspirin 81 MG chewable tablet Chew 1 tablet (81 mg total) by mouth daily. 03/11/17  Yes Hubbard Hartshorn, FNP  atorvastatin (LIPITOR) 80 MG tablet Take 0.5 tablets (40 mg total) by mouth daily. 03/10/17  Yes Hubbard Hartshorn, FNP  benzonatate (TESSALON) 100 MG capsule Take 1 capsule (100 mg total) by  mouth 2 (two) times daily as needed for cough. 04/14/17  Yes Hubbard Hartshorn, FNP  carvedilol (COREG) 3.125 MG tablet Take 1 tablet (3.125 mg total) by mouth 2 (two) times daily with a meal. 03/11/17  Yes Hubbard Hartshorn, FNP  clopidogrel (PLAVIX) 75 MG tablet Take 1 tablet (75 mg total) by mouth daily. Begin the day after taking 300mg  Clopidogrel. 03/11/17  Yes Hubbard Hartshorn, FNP  furosemide (LASIX) 20 MG tablet Take 1 tablet (20 mg total) by mouth daily. 04/29/17 07/28/17 Yes End,  Harrell Gave, MD  losartan (COZAAR) 25 MG tablet Take 1 tablet (25 mg total) by mouth daily. 04/29/17 07/28/17 Yes End, Harrell Gave, MD  metFORMIN (GLUCOPHAGE) 500 MG tablet Take 1 tablet (500 mg total) by mouth daily with breakfast. 04/14/17  Yes Hubbard Hartshorn, FNP  potassium chloride (K-DUR,KLOR-CON) 10 MEQ tablet Take 1 tablet (10 mEq total) by mouth daily. 03/11/17  Yes Hubbard Hartshorn, FNP  spironolactone (ALDACTONE) 25 MG tablet Take 0.5 tablets (12.5 mg total) by mouth daily. 03/11/17  Yes Hubbard Hartshorn, FNP    Review of Systems  Constitutional: Positive for fatigue (minimal). Negative for appetite change.  HENT: Negative for congestion, postnasal drip and sore throat.   Eyes: Negative.   Respiratory: Positive for shortness of breath. Negative for chest tightness.   Cardiovascular: Negative for chest pain, palpitations and leg swelling.  Gastrointestinal: Negative for abdominal distention and abdominal pain.  Endocrine: Negative.   Genitourinary: Negative.   Musculoskeletal: Negative for back pain and neck pain.  Skin: Negative.   Allergic/Immunologic: Negative.   Neurological: Positive for light-headedness (occasionally). Negative for dizziness and weakness.  Hematological: Negative for adenopathy. Does not bruise/bleed easily.  Psychiatric/Behavioral: Negative for dysphoric mood and sleep disturbance (sleeping on 2 pillows). The patient is not nervous/anxious.    Vitals:   06/16/17 1011  BP: 126/79  Pulse: 64  Resp: 18  SpO2: 100%  Weight: 145 lb 2 oz (65.8 kg)  Height: 5' (1.524 m)   Wt Readings from Last 3 Encounters:  06/16/17 145 lb 2 oz (65.8 kg)  05/11/17 145 lb 8 oz (66 kg)  04/29/17 149 lb (67.6 kg)   Lab Results  Component Value Date   CREATININE 1.01 05/11/2017   CREATININE 0.84 03/10/2017   CREATININE 0.99 02/03/2017    Physical Exam  Constitutional: He is oriented to person, place, and time. He appears well-developed and well-nourished.  HENT:  Head:  Normocephalic and atraumatic.  Neck: Normal range of motion. Neck supple. No JVD present.  Cardiovascular: Normal rate and regular rhythm.  Pulmonary/Chest: Effort normal. No respiratory distress. He has no wheezes. He has no rales.  Abdominal: Soft. He exhibits no distension. There is no tenderness.  Musculoskeletal: He exhibits no edema or tenderness.  Neurological: He is alert and oriented to person, place, and time.  Skin: Skin is warm and dry.  Psychiatric: He has a normal mood and affect. His behavior is normal. Thought content normal.  Nursing note and vitals reviewed.  Assessment & Plan:  1: Chronic heart failure with reduced ejection fraction- - NYHA class II - euvolemic today - weighing daily. Reminded to call for an overnight weight gain of >2 pounds or a weekly weight gain of >5 pounds - weight is stable - not adding salt; reminded to closely follow a 2000mg  sodium diet. Can use Mrs Deliah Boston for seasoning - will stop losartan and begin entresto 24/26mg  twice daily. 30 day voucher given with RX [facility] to [facility]  and RX also sent to medication management clinic - could also consider titrating up carvedilol in the future depending on heart rate - get a BMP today and will repeat at next visit - PharmD reconciled medications  2: STEMI- - taking aspirin and clopidogrel - saw cardiologist Sharolyn Douglas) 05/11/17 & returns 06/22/17  3: HTN-  - BP looks good today - went to [facility] 04/14/17 & returns 08/14/17 - BMP from 05/11/17 reviewed and showed sodium 137, potassium 4.8 and GFR >60  Patient did not bring his medications nor a list. Each medication was verbally reviewed with the patient and he was encouraged to bring the bottles to every visit to confirm accuracy of list.  Return in 6 weeks or sooner for any questions/problems before then.

## 2017-06-16 ENCOUNTER — Telehealth: Payer: Self-pay | Admitting: Internal Medicine

## 2017-06-16 ENCOUNTER — Encounter: Payer: Self-pay | Admitting: Family

## 2017-06-16 ENCOUNTER — Ambulatory Visit: Payer: Self-pay | Attending: Family | Admitting: Family

## 2017-06-16 VITALS — BP 126/79 | HR 64 | Resp 18 | Ht 60.0 in | Wt 145.1 lb

## 2017-06-16 DIAGNOSIS — I2102 ST elevation (STEMI) myocardial infarction involving left anterior descending coronary artery: Secondary | ICD-10-CM

## 2017-06-16 DIAGNOSIS — Z7982 Long term (current) use of aspirin: Secondary | ICD-10-CM | POA: Insufficient documentation

## 2017-06-16 DIAGNOSIS — Z825 Family history of asthma and other chronic lower respiratory diseases: Secondary | ICD-10-CM | POA: Insufficient documentation

## 2017-06-16 DIAGNOSIS — I5042 Chronic combined systolic (congestive) and diastolic (congestive) heart failure: Secondary | ICD-10-CM | POA: Insufficient documentation

## 2017-06-16 DIAGNOSIS — E785 Hyperlipidemia, unspecified: Secondary | ICD-10-CM | POA: Insufficient documentation

## 2017-06-16 DIAGNOSIS — Z7984 Long term (current) use of oral hypoglycemic drugs: Secondary | ICD-10-CM | POA: Insufficient documentation

## 2017-06-16 DIAGNOSIS — Z7902 Long term (current) use of antithrombotics/antiplatelets: Secondary | ICD-10-CM | POA: Insufficient documentation

## 2017-06-16 DIAGNOSIS — R42 Dizziness and giddiness: Secondary | ICD-10-CM | POA: Insufficient documentation

## 2017-06-16 DIAGNOSIS — I5022 Chronic systolic (congestive) heart failure: Secondary | ICD-10-CM

## 2017-06-16 DIAGNOSIS — I255 Ischemic cardiomyopathy: Secondary | ICD-10-CM | POA: Insufficient documentation

## 2017-06-16 DIAGNOSIS — I10 Essential (primary) hypertension: Secondary | ICD-10-CM

## 2017-06-16 DIAGNOSIS — Z79899 Other long term (current) drug therapy: Secondary | ICD-10-CM | POA: Insufficient documentation

## 2017-06-16 DIAGNOSIS — Z8249 Family history of ischemic heart disease and other diseases of the circulatory system: Secondary | ICD-10-CM | POA: Insufficient documentation

## 2017-06-16 DIAGNOSIS — I110 Hypertensive heart disease with heart failure: Secondary | ICD-10-CM | POA: Insufficient documentation

## 2017-06-16 DIAGNOSIS — I2510 Atherosclerotic heart disease of native coronary artery without angina pectoris: Secondary | ICD-10-CM | POA: Insufficient documentation

## 2017-06-16 DIAGNOSIS — M791 Myalgia, unspecified site: Secondary | ICD-10-CM | POA: Insufficient documentation

## 2017-06-16 DIAGNOSIS — I252 Old myocardial infarction: Secondary | ICD-10-CM | POA: Insufficient documentation

## 2017-06-16 DIAGNOSIS — R0602 Shortness of breath: Secondary | ICD-10-CM | POA: Insufficient documentation

## 2017-06-16 DIAGNOSIS — Z9889 Other specified postprocedural states: Secondary | ICD-10-CM | POA: Insufficient documentation

## 2017-06-16 DIAGNOSIS — E119 Type 2 diabetes mellitus without complications: Secondary | ICD-10-CM | POA: Insufficient documentation

## 2017-06-16 LAB — BASIC METABOLIC PANEL
Anion gap: 6 (ref 5–15)
BUN: 14 mg/dL (ref 6–20)
CO2: 28 mmol/L (ref 22–32)
Calcium: 9.4 mg/dL (ref 8.9–10.3)
Chloride: 103 mmol/L (ref 101–111)
Creatinine, Ser: 0.76 mg/dL (ref 0.61–1.24)
GFR calc non Af Amer: >60 mL/min (ref >60)
Glucose, Bld: 135 mg/dL — ABNORMAL HIGH (ref 65–99)
Potassium: 4 mmol/L (ref 3.5–5.1)
Sodium: 137 mmol/L (ref 135–145)

## 2017-06-16 MED ORDER — SACUBITRIL-VALSARTAN 24-26 MG PO TABS: 1.0000 | ORAL_TABLET | Freq: Two times a day (BID) | ORAL | 3 refills | Status: DC

## 2017-06-16 NOTE — Telephone Encounter (Signed)
Attempted to reach patient using [facility] ID # [phone]. Patient did not answer and voicemail was full. Unable to leave message.

## 2017-06-16 NOTE — Patient Instructions (Addendum)
Continue weighing daily and call for an overnight weight gain of > 2 pounds or a weekly weight gain of >5 pounds. Continue pesandose diariamente, y en el caso de de que gane mas de 2 libras diarias o 5 semanales por favor llame.    Stop taking the losartan when you begin Entresto. The entresto will be taken two times daily. Pare de tomar Losartan y Environmental education officer a Diplomatic Services operational officer. Entresto dos veces por dia.

## 2017-06-16 NOTE — Telephone Encounter (Signed)
Patient and family member Lucas [name] came by to ask about ECHO results Please call to discuss at [phone]

## 2017-06-17 ENCOUNTER — Other Ambulatory Visit: Payer: Self-pay

## 2017-06-17 DIAGNOSIS — I2102 ST elevation (STEMI) myocardial infarction involving left anterior descending coronary artery: Secondary | ICD-10-CM

## 2017-06-17 DIAGNOSIS — I5022 Chronic systolic (congestive) heart failure: Secondary | ICD-10-CM

## 2017-06-17 MED ORDER — CLOPIDOGREL BISULFATE 75 MG PO TABS: 75.0000 mg | ORAL_TABLET | Freq: Every day | ORAL | 0 refills | Status: DC

## 2017-06-17 MED ORDER — POTASSIUM CHLORIDE CRYS ER 10 MEQ PO TBCR: 10.0000 meq | EXTENDED_RELEASE_TABLET | Freq: Every day | ORAL | 0 refills | Status: DC

## 2017-06-17 NOTE — Telephone Encounter (Signed)
Call made to the patient using [facility] [employer] ID # [phone]. Attempted to call [phone]- no answer/ voice mail is full. Called the patient at [phone] and was able to reach him. The patient was made aware of his echo results using the interpreter.  He is aware to keep his follow up as scheduled with Ignacia Bayley, NP on 06/22/17.  The patient verbalized understanding.

## 2017-06-22 ENCOUNTER — Ambulatory Visit: Payer: Self-pay | Admitting: Nurse Practitioner

## 2017-07-13 ENCOUNTER — Other Ambulatory Visit: Payer: Self-pay

## 2017-07-13 ENCOUNTER — Ambulatory Visit: Payer: Self-pay | Admitting: Pharmacist

## 2017-07-13 VITALS — BP 130/76 | Ht <= 58 in | Wt 149.0 lb

## 2017-07-13 DIAGNOSIS — Z79899 Other long term (current) drug therapy: Secondary | ICD-10-CM

## 2017-07-13 NOTE — Progress Notes (Addendum)
[facility]ication Management Clinic Visit Note  Patient: Lucas [name] MRN: [id number] Date of Birth: [birth year] PCP: Patient, [name]   Lucas [name] 62 y.o. male presents for a follow up medication therapy management review visit today. He is accompanied by his son and the hospital interpreter.  BP 130/76 and 149 lbs   Patient Information   Past Medical History:  Diagnosis Date  . Acute ST elevation myocardial infarction (STEMI) involving left anterior descending (LAD) coronary artery ([place])    a. 01/2017 Ant STEMI/PCI: LAD 20ost, 100p (3.0x23 Xience Sierra DES).  . CAD (coronary artery disease)    a. 01/2017 Ant STEMI/PCI: LM 20d, LAD 20ost, 100p (3.0x23 Xience Sierra DES), 33m, D1 50ost, LCX nl, RCA 30p.  . Chronic combined systolic (congestive) and diastolic (congestive) heart failure ([place])    a. 01/2018 Echo: EF 30-35%, sev antsept, ant, apical HK. Gr1 DD, mild MR.  . Essential hypertension   . Hyperlipidemia    a. Myalgias with high dose lipitor - tolerating 40mg  daily.  . Ischemic cardiomyopathy    a. 01/2018 Echo: EF 30-35%.  . Type II diabetes mellitus ([place])       Past Surgical History:  Procedure Laterality Date  . APPENDECTOMY    . CARDIAC CATHETERIZATION    . CORONARY ANGIOPLASTY    . CORONARY/GRAFT ACUTE MI REVASCULARIZATION N/A 01/25/2017   Procedure: Coronary/Graft Acute MI Revascularization;  Surgeon: Nelva Bush, MD;  Location: [place] [facility];  Service: Cardiovascular;  Laterality: N/A;  . LEFT HEART CATH AND CORONARY ANGIOGRAPHY N/A 01/25/2017   Procedure: LEFT HEART CATH AND CORONARY ANGIOGRAPHY;  Surgeon: Nelva Bush, MD;  Location: [place] [facility];  Service: Cardiovascular;  Laterality: N/A;     Family History  Problem Relation Age of Onset  . Heart disease Father        s/p pacemaker  . Arrhythmia Father   . Asthma Father   . Asthma Mother       Lifestyle Diet: Breakfast: piece of chicken with out fat, veggies,  lettuce Lunch: Beans, 2 tortillas, fruits like bananas or orange Dinner: Fruits, lettuce, salad, grilled chicken Also eats nuts and seeds   Outpatient Encounter Medications as of 07/13/2017  Medication Sig  . aspirin 81 MG chewable tablet Chew 1 tablet (81 mg total) by mouth daily.  Marland Kitchen atorvastatin (LIPITOR) 80 MG tablet Take 0.5 tablets (40 mg total) by mouth daily.  . benzonatate (TESSALON) 100 MG capsule Take 1 capsule (100 mg total) by mouth 2 (two) times daily as needed for cough.  . carvedilol (COREG) 3.125 MG tablet Take 1 tablet (3.125 mg total) by mouth 2 (two) times daily with a meal.  . clopidogrel (PLAVIX) 75 MG tablet Take 1 tablet (75 mg total) by mouth daily. Begin the day after taking 300mg  Clopidogrel.  . furosemide (LASIX) 20 MG tablet Take 1 tablet (20 mg total) by mouth daily.  . metFORMIN (GLUCOPHAGE) 500 MG tablet Take 1 tablet (500 mg total) by mouth daily with breakfast.  . potassium chloride (K-DUR,KLOR-CON) 10 MEQ tablet Take 1 tablet (10 mEq total) by mouth daily.  . sacubitril-valsartan (ENTRESTO) 24-26 MG Take 1 tablet by mouth 2 (two) times daily.  Marland Kitchen spironolactone (ALDACTONE) 25 MG tablet Take 0.5 tablets (12.5 mg total) by mouth daily.   No facility-administered encounter medications on file as of 07/13/2017.       Current Exercise Habits: Home exercise routine, Type of exercise: walking, Time (Minutes): 30, Frequency (Times/Week): 7, Weekly Exercise (Minutes/Week): 210,  Intensity: Moderate  Exercise limited by: None identified    Social History   Substance and Sexual Activity  Alcohol Use No  . Frequency: Never      Social History   Tobacco Use  Smoking Status Never Smoker  Smokeless Tobacco Never Used      Health Maintenance  Topic Date Due  . Hepatitis C Screening  Jul 11, 1955  . PNEUMOCOCCAL POLYSACCHARIDE VACCINE (1) 11/17/1959  . FOOT EXAM  11/17/1967  . OPHTHALMOLOGY EXAM  11/17/1967  . URINE MICROALBUMIN  11/17/1967  . HIV Screening   11/16/1972  . TETANUS/TDAP  11/16/1976  . COLONOSCOPY  11/17/2007  . HEMOGLOBIN A1C  09/07/2017  . INFLUENZA VACCINE  10/01/2017     Assessment and Plan:  Family Support: Son is very involved and care and has medication records. Son brought a list of his father's medications to this appointment  Muscle Pain: Patient reported having bilateral arm pain when he wakes up every morning. If he moves his arms around or does any exercise then it goes away. Patient was told by the MD that this is a side effect of Lipitor. Patient doesn't want to take any pain medications at this time.  GI/Loss of appetite: Patient reports not having much of an appetite recently, admits to having a pain in his chest if he eats after 6pm. Encouraged patient to tell the MD at [facility] when he goes tomorrow  Blood pressure: Patient checks blood pressure twice a monthly at the supermarket or pharmacies. Today's BP was 130/76 mmHg, which is within normal limits. He took his BP medication this morning and is compliant with all of his medications. Patient and son report no side effects with medications  Patient is adherent and seems to be doing well. Would like to follow up once yearly to discuss medications and side effects.  PLAN: [facility] appointment 07/14/17 Appointment with cardiology 07/16/17 Appointment with Darylene Price, [facility] 08/04/17 RTC in 1 year; patient to call if any issues before that time   Cosigned: Keri K. Dicky Doe, PharmD [facility] [place] Operations Coordinator [phone]

## 2017-07-14 ENCOUNTER — Ambulatory Visit: Payer: Self-pay | Admitting: Family Medicine

## 2017-07-14 VITALS — BP 118/77 | HR 61 | Temp 98.0°F | Ht 64.0 in | Wt 148.9 lb

## 2017-07-14 DIAGNOSIS — I255 Ischemic cardiomyopathy: Secondary | ICD-10-CM

## 2017-07-14 DIAGNOSIS — M25512 Pain in left shoulder: Secondary | ICD-10-CM

## 2017-07-14 DIAGNOSIS — R9341 Abnormal radiologic findings on diagnostic imaging of renal pelvis, ureter, or bladder: Secondary | ICD-10-CM

## 2017-07-14 DIAGNOSIS — I10 Essential (primary) hypertension: Secondary | ICD-10-CM

## 2017-07-14 DIAGNOSIS — I5022 Chronic systolic (congestive) heart failure: Secondary | ICD-10-CM

## 2017-07-14 NOTE — Patient Instructions (Addendum)
Please call Dr. Saunders Revel tomorrow first thing to see if they can work you in right away or okay to wait for Thursday Please have the shoulder xray done at the hospital  Please call [phone] to schedule your imaging test, MRI AFTER the xray Please wait 2-3 days after the order has been placed to call and get your test scheduled  Tylenol per package directions for pain Try turmeric as a natural anti-inflammatory (for pain and arthritis). It comes in capsules where you buy aspirin and fish oil, but also as a spice where you buy pepper and garlic powder.

## 2017-07-14 NOTE — Assessment & Plan Note (Signed)
Well controlled today.

## 2017-07-14 NOTE — Assessment & Plan Note (Signed)
No s/s of fluid overload

## 2017-07-14 NOTE — Progress Notes (Signed)
BP 118/77   Pulse 61   Temp 98 F (36.7 C)   Ht 5\' 4"  (1.626 m)   Wt 148 lb 14.4 oz (67.5 kg)   BMI 25.56 kg/m    Subjective:    Patient ID: Lucas [name], male    DOB: 29-Dec-1955, 62 y.o.   MRN: [id number]  HPI: Lucas [name] is a 62 y.o. male  Chief Complaint  Patient presents with  . Arm Pain    when I get up in the AM I feel muscle aches, then 85mins later, starts descending. Aches starts in shoulders, esp L side, then descends down the arms.     HPI Patient is here for arm pain; seen with Spanish-speaking interpreter; his son is here as well  He is having pain in the left shoulder; going on for two weeks When he hurts, the pain starts when he is going to sleep Very light In the morning, the pain is in across the anterior shoulder on the left He rolls over on the right side; that helps and he rests some The pain started about two weeks; no cough First it was just the arm, pointing over the left shoulder, more than the right Then down across the pectoral muscle When he moves his arm, he can reproduce the pain, pointing across the anterior shoulder He is right-handed; no old injuries He does not work; when he reaches up above him, he starts to do some exercises, moving the arms very slowly He has not tried anything at all by mouth He is thinking that is related to the heart He gets pain down the arm If he walks really quickly, then hard time breathing No SHOB or nausea at the time of the light pain; he does get sweaty, more on the left than the right He sees Dr. Harrell Gave End (cardiologist); he has not contacted the cardiologist about these symptoms Father had heart problems around age 11; got fatigued and tired, took him to the doctor and his heart wasn't squeezing very well  Asymmetric wall thickening on the CT scan November 2018; thickening right anterior bladder; I asked about symptoms; now that he is getting rid of fluid; no blood in the urine; smoked  a little bit when he was a youngster  Hx of CAD; on Plavix; on aspirin; no bleeding from anywhere, nose, gums, urine, colon  Type 2 DM; taking valsartan for heart failure, glucophage Lab Results  Component Value Date   HGBA1C 6.3 (H) 03/10/2017   Hyperlipidemia; on 80 mg atorvastatin   Depression screen [facility] 2/9 06/16/2017 03/17/2017 03/17/2017 02/02/2017  Decreased Interest 0 0 0 0  Down, Depressed, Hopeless 0 1 0 0  PHQ - 2 Score 0 1 0 0    Relevant past medical, surgical, family and social history reviewed Past Medical History:  Diagnosis Date  . Acute ST elevation myocardial infarction (STEMI) involving left anterior descending (LAD) coronary artery ([place])    a. 01/2017 Ant STEMI/PCI: LAD 20ost, 100p (3.0x23 Xience Sierra DES).  . CAD (coronary artery disease)    a. 01/2017 Ant STEMI/PCI: LM 20d, LAD 20ost, 100p (3.0x23 Xience Sierra DES), 70m, D1 50ost, LCX nl, RCA 30p.  . Chronic combined systolic (congestive) and diastolic (congestive) heart failure ([place])    a. 01/2018 Echo: EF 30-35%, sev antsept, ant, apical HK. Gr1 DD, mild MR.  . Essential hypertension   . Hyperlipidemia    a. Myalgias with high dose lipitor - tolerating 40mg  daily.  . Ischemic cardiomyopathy  a. 01/2018 Echo: EF 30-35%.  . Type II diabetes mellitus ([place])    Past Surgical History:  Procedure Laterality Date  . APPENDECTOMY    . CARDIAC CATHETERIZATION    . CORONARY ANGIOPLASTY    . CORONARY/GRAFT ACUTE MI REVASCULARIZATION N/A 01/25/2017   Procedure: Coronary/Graft Acute MI Revascularization;  Surgeon: Nelva Bush, MD;  Location: [place] [facility];  Service: Cardiovascular;  Laterality: N/A;  . LEFT HEART CATH AND CORONARY ANGIOGRAPHY N/A 01/25/2017   Procedure: LEFT HEART CATH AND CORONARY ANGIOGRAPHY;  Surgeon: Nelva Bush, MD;  Location: [place] [facility];  Service: Cardiovascular;  Laterality: N/A;   Family History  Problem Relation Age of Onset  . Heart disease Father         s/p pacemaker  . Arrhythmia Father   . Asthma Father   . Asthma Mother    Social History   Tobacco Use  . Smoking status: Never Smoker  . Smokeless tobacco: Never Used  Substance Use Topics  . Alcohol use: No    Frequency: Never  . Drug use: No    Interim medical history since last visit reviewed. Allergies and medications reviewed  Review of Systems Per HPI unless specifically indicated above     Objective:    BP 118/77   Pulse 61   Temp 98 F (36.7 C)   Ht 5\' 4"  (1.626 m)   Wt 148 lb 14.4 oz (67.5 kg)   BMI 25.56 kg/m   Wt Readings from Last 3 Encounters:  07/14/17 148 lb 14.4 oz (67.5 kg)  07/13/17 149 lb (67.6 kg)  06/16/17 145 lb 2 oz (65.8 kg)    Physical Exam  Constitutional: He appears well-developed and well-nourished. No distress.  Eyes: No scleral icterus.  Cardiovascular: Normal rate and regular rhythm.  Pulmonary/Chest: Effort normal and breath sounds normal.  Musculoskeletal:       Right shoulder: He exhibits normal range of motion.       Left shoulder: He exhibits decreased range of motion and tenderness. He exhibits no bony tenderness, no swelling, no effusion and no deformity.  Tenderness over the anterior LEFT shoulder, tender to touch; no bruising, no swelling; origin of biceps tendon tender; no evidence of biceps muscle deformity to suggest tendon rupture; palpation reproduces his pain  Neurological: He is alert.  UE strength limited by pain only; biceps strength on the LEFT is reduced as patient grimaces with discomfort  Skin: No pallor.  Psychiatric: He has a normal mood and affect. His mood appears not anxious. He does not exhibit a depressed mood.    Results for orders placed or performed in visit on 69/48/54  Basic metabolic panel  Result Value Ref Range   Sodium 137 135 - 145 mmol/L   Potassium 4.0 3.5 - 5.1 mmol/L   Chloride 103 101 - 111 mmol/L   CO2 28 22 - 32 mmol/L   Glucose, Bld 135 (H) 65 - 99 mg/dL   BUN 14 6 - 20 mg/dL    Creatinine, Ser 0.76 0.61 - 1.24 mg/dL   Calcium 9.4 8.9 - 10.3 mg/dL   GFR calc non Af Amer >60 >60 mL/min   GFR calc Af Amer >60 >60 mL/min   Anion gap 6 5 - 15      Assessment & Plan:   Problem List Items Addressed This Visit      Cardiovascular and Mediastinum   Ischemic cardiomyopathy    Encouraged him to talk with his cardiologist about his pain,  since it involved the left shoulder, upper chest, but symptoms and examination do strongly suggest musculoskeletal cause for his symptoms; he already has an appt for Thursday; patient is [facility]; we do not have an EKG machine; urged him to contact his heart doctor first thing Wed morning to see if they want to see him Wednesday instead of waiting until Thursday (they will know his cardiac hx more thoroughly)      HTN (hypertension)    Well-controlled today      Chronic systolic heart failure (HCC) (Chronic)    No s/s of fluid overload       Other Visit Diagnoses    Acute pain of left shoulder    -  Primary   reproducible pain; anterior LEFT shoulder; will get plain films, followed by MRI; f/u after MRI; tylenol, turmeric; avoid NSAIDs b/c of cardiac risk   Relevant Orders   DG Shoulder Left   MR Shoulder Left Wo Contrast   Abnormal CT scan, bladder       explained findings on CT scan; refer to urologist for thoroughness   Relevant Orders   Ambulatory referral to Urology       Follow up plan: No follow-ups on file.  An after-visit summary was printed and given to the patient at [facility].  Please see the patient instructions which may contain other information and recommendations beyond what is mentioned above in the assessment and plan.  No orders of the defined types were placed in this encounter.   Orders Placed This Encounter  Procedures  . DG Shoulder Left  . MR Shoulder Left Wo Contrast  . Ambulatory referral to Urology

## 2017-07-14 NOTE — Assessment & Plan Note (Signed)
Encouraged him to talk with his cardiologist about his pain, since it involved the left shoulder, upper chest, but symptoms and examination do strongly suggest musculoskeletal cause for his symptoms; he already has an appt for Thursday; patient is [facility]; we do not have an EKG machine; urged him to contact his heart doctor first thing Wed morning to see if they want to see him Wednesday instead of waiting until Thursday (they will know his cardiac hx more thoroughly)

## 2017-07-15 NOTE — Progress Notes (Signed)
Cardiology Office Note Date:  07/16/2017  Patient ID:  Lucas [name], DOB 16-Dec-1955, MRN [id number] PCP:  Patient, [name]  Cardiologist:  Dr. Saunders Revel, MD    Chief Complaint: Follow up  History of Present Illness: Lucas [name] is a 62 y.o. male with history of CAD status post anterior STEMI with thrombotic occlusion of the LAD status post PCI/DES to the LAD in 01/2017, ischemic cardiomyopathy with an EF of 35%, chronic combined CHF, DM2, HTN, and HLD who presents for follow-up.  He was admitted to the hospital in 01/2017 with a late presenting anterior ST segment elevation MI.  Troponin peaked at >65.  LHC at that time showed acute plaque rupture and 100% thrombotic occlusion of the proximal LAD.  He underwent successful PCI/DES to the LAD.  LHC otherwise showed mild to moderate, nonobstructive disease involving the mid/distal LAD and RCA.  TTE at that time showed an EF of 30-35%, severe hypokinesis of the anteroseptal, anterior, and apical myocardium, grade 1 diastolic dysfunction, mild MR. He was discharged on evidence-based medications including Coreg, lisinopril, and spironolactone.  He was also discharged with a LifeVest.  However, after wearing this for only 2 days, he was unable to tolerate it secondary to a constricted feeling in his chest with accompanying anxiety.  Patient chose to self discontinued the LifeVest.  He did not receive any shocks while wearing the vest.  At his office visit in 04/2017 the patient reported a cough with lisinopril and was transitioned to losartan.  He was also felt to be mildly volume overloaded at that time and placed on low-dose Lasix.  He was most recently seen in our office in 05/2017 and was relatively stable at that time.  Blood pressure was well controlled.  Weight of 145 pounds, which was down 4 pounds from his 04/2017 visit.  Follow-up limited echo on 05/29/2017 showed an improvement in his EF to 40 to 45%, hypokinesis of the anterior,  anteroseptal, and apical myocardium, grade 1 diastolic dysfunction, mild MR, normal size LA, RVSF normal, PASP normal.  He was seen in the [facility] CHF clinic on 06/16/2017 with a blood pressure of 126/79, weight 145 pounds.  At that time, losartan was stopped and he was started on Entresto.  Labs: BMP 06/16/17 - potassium 4.0, serum creatinine 0.76.  Lipid 05/11/2017 - LDL 29; CMP - 05/11/2017 unremarkable liver function.  He comes in doing well from a cardiac perspective.  He does note a 2-week history of left shoulder pain that is associated with range of motion and reproducible to palpation.  Pain does not feel similar to his prior MI.  No associated chest pain, diaphoresis, palpitations, nausea, vomiting, dizziness, presyncope, or syncope.  Has done a lot of repetitive, overhead motion with the left arm.  [facility] and it was noted to be associated with range of motion at that time as well.  X-rays are pending.  He has been compliant with all medications.  He has a stable 2 pillow orthopnea.  No early satiety, lower extremity swelling, abdominal distention, cough, or PND.  He was able to get Entresto from medication management without issues.  Office visit was completed with the assistance of and [facility] medical interpreter.  Past Medical History:  Diagnosis Date  . Acute ST elevation myocardial infarction (STEMI) involving left anterior descending (LAD) coronary artery ([place])    a. 01/2017 Ant STEMI/PCI: LAD 20ost, 100p (3.0x23 Xience [place]  DES).  . CAD (coronary artery disease)    a. 01/2017 Ant STEMI/PCI: LM 20d, LAD 20ost, 100p (3.0x23 Xience [place] DES), 24m, D1 50ost, LCX nl, RCA 30p.  . Chronic combined systolic (congestive) and diastolic (congestive) heart failure ([place])    a. 01/2018 Echo: EF 30-35%, sev antsept, ant, apical HK. Gr1 DD, mild MR.  . Essential hypertension   . Hyperlipidemia    a. Myalgias with high dose lipitor -  tolerating 40mg  daily.  . Ischemic cardiomyopathy    a. 01/2018 Echo: EF 30-35%.  . Type II diabetes mellitus ([place])     Past Surgical History:  Procedure Laterality Date  . APPENDECTOMY    . CARDIAC CATHETERIZATION    . CORONARY ANGIOPLASTY    . CORONARY/GRAFT ACUTE MI REVASCULARIZATION N/A 01/25/2017   Procedure: Coronary/Graft Acute MI Revascularization;  Surgeon: Nelva Bush, MD;  Location: [place] CV LAB;  Service: Cardiovascular;  Laterality: N/A;  . LEFT HEART CATH AND CORONARY ANGIOGRAPHY N/A 01/25/2017   Procedure: LEFT HEART CATH AND CORONARY ANGIOGRAPHY;  Surgeon: Nelva Bush, MD;  Location: [place] CV LAB;  Service: Cardiovascular;  Laterality: N/A;    Current Meds  Medication Sig  . aspirin 81 MG chewable tablet Chew 1 tablet (81 mg total) by mouth daily.  Marland Kitchen atorvastatin (LIPITOR) 80 MG tablet Take 0.5 tablets (40 mg total) by mouth daily.  . benzonatate (TESSALON) 100 MG capsule Take 1 capsule (100 mg total) by mouth 2 (two) times daily as needed for cough.  . carvedilol (COREG) 3.125 MG tablet Take 1 tablet (3.125 mg total) by mouth 2 (two) times daily with a meal.  . clopidogrel (PLAVIX) 75 MG tablet Take 1 tablet (75 mg total) by mouth daily. Begin the day after taking 300mg  Clopidogrel.  . furosemide (LASIX) 20 MG tablet Take 1 tablet (20 mg total) by mouth daily.  . metFORMIN (GLUCOPHAGE) 500 MG tablet Take 1 tablet (500 mg total) by mouth daily with breakfast.  . potassium chloride (K-DUR,KLOR-CON) 10 MEQ tablet Take 1 tablet (10 mEq total) by mouth daily.  . sacubitril-valsartan (ENTRESTO) 24-26 MG Take 1 tablet by mouth 2 (two) times daily.  Marland Kitchen spironolactone (ALDACTONE) 25 MG tablet Take 0.5 tablets (12.5 mg total) by mouth daily.    Allergies:   Patient has no known allergies.   Social History:  The patient  reports that he has never smoked. He has never used smokeless tobacco. He reports that he does not drink alcohol or use drugs.    Family History:  The patient's family history includes Arrhythmia in his father; Asthma in his father and mother; Heart disease in his father.  ROS:   Review of Systems  Constitutional: Negative for chills, diaphoresis, fever, malaise/fatigue and weight loss.  HENT: Negative for congestion.   Eyes: Negative for discharge and redness.  Respiratory: Negative for cough, hemoptysis, sputum production, shortness of breath and wheezing.   Cardiovascular: Negative for chest pain, palpitations, orthopnea, claudication, leg swelling and PND.  Gastrointestinal: Negative for abdominal pain, blood in stool, heartburn, melena, nausea and vomiting.  Genitourinary: Negative for hematuria.  Musculoskeletal: Positive for joint pain and myalgias. Negative for falls.       Pain/soreness with range of motion of the left shoulder.  Skin: Negative for rash.  Neurological: Negative for dizziness, tingling, tremors, sensory change, speech change, focal weakness, loss of consciousness and weakness.  Endo/Heme/Allergies: Does not bruise/bleed easily.  Psychiatric/Behavioral: Negative for substance abuse. The patient is not nervous/anxious.  PHYSICAL EXAM:  VS:  BP 108/60 (BP Location: Left Arm, Patient Position: Sitting, Cuff Size: Normal)   Pulse 63   Ht 5' (1.524 m)   Wt 147 lb 8 oz (66.9 kg)   BMI 28.81 kg/m  BMI: Body mass index is 28.81 kg/m.  Physical Exam  Constitutional: He is oriented to person, place, and time. He appears well-developed and well-nourished.  HENT:  Head: Normocephalic and atraumatic.  Eyes: Right eye exhibits no discharge. Left eye exhibits no discharge.  Neck: Normal range of motion. No JVD present.  Cardiovascular: Normal rate, regular rhythm, S1 normal and S2 normal. Exam reveals no distant heart sounds, no friction rub, no midsystolic click and no opening snap.  Murmur heard. High-pitched blowing holosystolic murmur is present with a grade of 1/6 at the  apex. Pulses:      Posterior tibial pulses are 2+ on the right side, and 2+ on the left side.  Pulmonary/Chest: Effort normal and breath sounds normal. No respiratory distress. He has no decreased breath sounds. He has no wheezes. He has no rales. He exhibits no tenderness.  Abdominal: Soft. He exhibits no distension. There is no tenderness.  Musculoskeletal: He exhibits no edema.  Neurological: He is alert and oriented to person, place, and time.  Skin: Skin is warm and dry. No cyanosis. Nails show no clubbing.  Psychiatric: He has a normal mood and affect. His speech is normal and behavior is normal. Judgment and thought content normal.     EKG:  Was ordered and interpreted by me today. Shows sinus bradycardia, 57 bpm, low voltage QRS, prior septal infarct, no acute ST-T changes (unchanged from prior)  Recent Labs: 01/25/2017: TSH 2.060 01/28/2017: Hemoglobin 14.8; Magnesium 2.0; Platelets 195 05/11/2017: ALT 28 06/16/2017: BUN 14; Creatinine, Ser 0.76; Potassium 4.0; Sodium 137  05/11/2017: Cholesterol 81; HDL 29; LDL Cholesterol 29; Total CHOL/HDL Ratio 2.8; Triglycerides 117; VLDL 23   CrCl cannot be calculated (Patient's most recent lab result is older than the maximum 21 days allowed.).   Wt Readings from Last 3 Encounters:  07/16/17 147 lb 8 oz (66.9 kg)  07/14/17 148 lb 14.4 oz (67.5 kg)  07/13/17 149 lb (67.6 kg)     Other studies reviewed: Additional studies/records reviewed today include: summarized above  ASSESSMENT AND PLAN:  1. CAD of native coronary arteries without angina: No symptoms concerning for angina at this time.  Patient does report a couple week history of left shoulder pain however this appears to be typical with musculoskeletal pain as it is reproducible with palpation as well as exacerbated with range of motion.  I have recommended he follow-up with his PCP regarding this musculoskeletal pain.  Otherwise, he will continue dual antiplatelet therapy with  aspirin 81 mg daily and Plavix 75 mg daily.  Continue secondary prevention and risk factor modification.  2. Chronic combined CHF/ICM: He does not appear grossly volume overloaded at this time.  Recent echocardiogram from 05/2017 showed improvement in LV systolic function with an EF of 40 to 45%.  Continue Coreg 3.125 mg twice daily, Lasix 20 mg daily, Entresto 24/26 mg twice daily, and spironolactone 12.5 mg daily.  Blood pressure and heart rate preclude further titration of evidence-based heart failure therapy at this time.  We will plan for repeat limited echocardiogram in approximately 3 months to evaluate for improvement in LV systolic function now that he is on Entresto.  Patient and his son indicate to me that he is able to get Entresto from  medication management without issues.  CHF education provided.  Daily weights.  3. Musculoskeletal shoulder pain: [facility]  Pain is fully reproducible to palpation and exacerbated with range of motion.  Patient has done a lot of repetitive, overhead motion with his arms.  Most likely this is related to musculoskeletal etiology.  Symptoms do not feel similar to his prior MI.  Plain films are pending per PCP.  Currently pain-free at rest.  EKG is unchanged.  Encouraged him to follow-up with his PCP.  4. HLD: LDL of 29 from 05/2017 with normal liver function at that time.  Continue Lipitor 80 mg daily.  5. HTN: Blood pressure is well controlled today.  Continue Coreg, Entresto, and spironolactone.  6. Language barrier: [facility] medical interpreter was utilized for the encounter today.  Disposition: F/u with Dr. Saunders Revel in 3 months.  Current medicines are reviewed at length with the patient today.  The patient did not have any concerns regarding medicines.  Signed, Christell Faith, PA-C 07/16/2017 10:57 AM     [facility] [address] [place] [address] [place], [place] [address] [phone]

## 2017-07-16 ENCOUNTER — Encounter: Payer: Self-pay | Admitting: Physician Assistant

## 2017-07-16 ENCOUNTER — Ambulatory Visit (INDEPENDENT_AMBULATORY_CARE_PROVIDER_SITE_OTHER): Payer: Self-pay | Admitting: Physician Assistant

## 2017-07-16 VITALS — BP 108/60 | HR 63 | Ht 60.0 in | Wt 147.5 lb

## 2017-07-16 DIAGNOSIS — I5022 Chronic systolic (congestive) heart failure: Secondary | ICD-10-CM

## 2017-07-16 DIAGNOSIS — I255 Ischemic cardiomyopathy: Secondary | ICD-10-CM

## 2017-07-16 DIAGNOSIS — I2510 Atherosclerotic heart disease of native coronary artery without angina pectoris: Secondary | ICD-10-CM

## 2017-07-16 DIAGNOSIS — I10 Essential (primary) hypertension: Secondary | ICD-10-CM

## 2017-07-16 DIAGNOSIS — E785 Hyperlipidemia, unspecified: Secondary | ICD-10-CM

## 2017-07-16 DIAGNOSIS — M25512 Pain in left shoulder: Secondary | ICD-10-CM

## 2017-07-16 DIAGNOSIS — Z789 Other specified health status: Secondary | ICD-10-CM

## 2017-07-16 NOTE — Patient Instructions (Signed)
Medication Instructions:  Your physician recommends that you continue on your current medications as directed. Please refer to the Current Medication list given to you today.   Labwork: Your physician recommends that you return for lab work in: [place] (bmet).   Testing/Procedures: none  Follow-Up: Your physician recommends that you schedule a follow-up appointment in: 3 MONTHS WITH DR END.   If you need a refill on your cardiac medications before your next appointment, please call your pharmacy.

## 2017-07-17 LAB — BASIC METABOLIC PANEL
BUN / CREAT RATIO: 14 (ref 9–20)
BUN: 11 mg/dL (ref 6–24)
CHLORIDE: 102 mmol/L (ref 96–106)
CO2: 21 mmol/L (ref 20–29)
Calcium: 9.8 mg/dL (ref 8.7–10.2)
Creatinine, Ser: 0.78 mg/dL (ref 0.76–1.27)
GFR calc Af Amer: 114 mL/min/{1.73_m2} (ref >59)
GFR calc non Af Amer: 99 mL/min/{1.73_m2} (ref >59)
GLUCOSE: 115 mg/dL — AB (ref 65–99)
Potassium: 4.4 mmol/L (ref 3.5–5.2)
Sodium: 137 mmol/L (ref 134–144)

## 2017-07-22 ENCOUNTER — Encounter: Payer: Self-pay | Admitting: *Deleted

## 2017-07-30 ENCOUNTER — Ambulatory Visit
Admission: RE | Admit: 2017-07-30 | Discharge: 2017-07-30 | Disposition: A | Discharge status: Home / Self Care | Payer: Self-pay | Source: Ambulatory Visit | Attending: Family Medicine | Admitting: Family Medicine

## 2017-07-30 DIAGNOSIS — M19012 Primary osteoarthritis, left shoulder: Secondary | ICD-10-CM | POA: Insufficient documentation

## 2017-07-30 DIAGNOSIS — M25512 Pain in left shoulder: Secondary | ICD-10-CM

## 2017-07-30 DIAGNOSIS — M25712 Osteophyte, left shoulder: Secondary | ICD-10-CM | POA: Insufficient documentation

## 2017-07-30 DIAGNOSIS — M75122 Complete rotator cuff tear or rupture of left shoulder, not specified as traumatic: Secondary | ICD-10-CM | POA: Insufficient documentation

## 2017-08-04 ENCOUNTER — Ambulatory Visit: Payer: Self-pay | Admitting: Family

## 2017-08-04 ENCOUNTER — Telehealth: Payer: Self-pay | Admitting: Family

## 2017-08-04 NOTE — Telephone Encounter (Signed)
Patient did not show for his Heart Failure Clinic appointment on 08/04/17. Will attempt to reschedule.

## 2017-08-11 ENCOUNTER — Other Ambulatory Visit: Payer: Self-pay | Admitting: Internal Medicine

## 2017-08-11 ENCOUNTER — Telehealth: Payer: Self-pay | Admitting: Family Medicine

## 2017-08-11 DIAGNOSIS — I2102 ST elevation (STEMI) myocardial infarction involving left anterior descending coronary artery: Secondary | ICD-10-CM

## 2017-08-11 DIAGNOSIS — I5022 Chronic systolic (congestive) heart failure: Secondary | ICD-10-CM

## 2017-08-11 DIAGNOSIS — I10 Essential (primary) hypertension: Secondary | ICD-10-CM

## 2017-08-11 DIAGNOSIS — M75102 Unspecified rotator cuff tear or rupture of left shoulder, not specified as traumatic: Secondary | ICD-10-CM

## 2017-08-11 DIAGNOSIS — M75100 Unspecified rotator cuff tear or rupture of unspecified shoulder, not specified as traumatic: Secondary | ICD-10-CM | POA: Insufficient documentation

## 2017-08-11 NOTE — Telephone Encounter (Signed)
-----   Message from Kreg Shropshire, [place] sent at 08/11/2017  2:34 PM EDT ----- Please review ----- Message ----- From: Interface, Rad Results In Sent: 07/31/2017  11:15 AM To: Bod Results Pool

## 2017-08-11 NOTE — Telephone Encounter (Signed)
MRI results received Patient needs referral to orthopaedist

## 2017-08-11 NOTE — Telephone Encounter (Signed)
Patient has a full thickness tear of a tendon in his shoulder; we need him to see an orthopaedic surgeon Please let him know I am so glad that we got the MRI and we'll get him to a specialist to help him feel better

## 2017-08-11 NOTE — Assessment & Plan Note (Signed)
LEFT, refer to ortho

## 2017-08-12 ENCOUNTER — Encounter: Payer: Self-pay | Admitting: Urology

## 2017-08-14 NOTE — Telephone Encounter (Signed)
If someone could please contact him about this and document IN this note and sign off, I would greatly appreciate it Thank you

## 2017-08-17 ENCOUNTER — Telehealth: Payer: Self-pay

## 2017-08-17 NOTE — Telephone Encounter (Signed)
Jacqui and I both are trying to reach him. We called his son but his voicemail is not set up yet. I will note the attempts in CHL. I will ask Jacqui to do the same.   Routing comment    Copied from routing comment I will sign off on this note

## 2017-08-17 NOTE — Telephone Encounter (Signed)
Trying to reach patient about results from an MRI of his shoulder.  We have reached out to patient as well as his son Lucas [name] who has a voicemail not set up yet.  Lucas [name] is attempting to reach out to patient as well as he speaks spanish.  We will update telephone encounter as soon as we reach patient.

## 2017-08-18 NOTE — Telephone Encounter (Signed)
Lucas [name], Lucas [name] spoke with patient and advised him of the referral to an orthopedic surgeon.  We are in the processes of getting him set up on [facility] with [facility] for the referral.

## 2017-09-14 ENCOUNTER — Other Ambulatory Visit: Payer: Self-pay

## 2017-09-14 DIAGNOSIS — I2102 ST elevation (STEMI) myocardial infarction involving left anterior descending coronary artery: Secondary | ICD-10-CM

## 2017-09-14 MED ORDER — CLOPIDOGREL BISULFATE 75 MG PO TABS: 75.0000 mg | ORAL_TABLET | Freq: Every day | ORAL | 0 refills | Status: DC

## 2017-09-28 ENCOUNTER — Other Ambulatory Visit: Payer: Self-pay | Admitting: Internal Medicine

## 2017-09-28 DIAGNOSIS — R7303 Prediabetes: Secondary | ICD-10-CM

## 2017-10-28 ENCOUNTER — Encounter: Payer: Self-pay | Admitting: Internal Medicine

## 2017-10-28 ENCOUNTER — Ambulatory Visit (INDEPENDENT_AMBULATORY_CARE_PROVIDER_SITE_OTHER): Payer: Self-pay | Admitting: Internal Medicine

## 2017-10-28 VITALS — BP 118/68 | HR 67 | Ht 60.0 in | Wt 147.0 lb

## 2017-10-28 DIAGNOSIS — R079 Chest pain, unspecified: Secondary | ICD-10-CM

## 2017-10-28 DIAGNOSIS — I255 Ischemic cardiomyopathy: Secondary | ICD-10-CM

## 2017-10-28 DIAGNOSIS — I25119 Atherosclerotic heart disease of native coronary artery with unspecified angina pectoris: Secondary | ICD-10-CM

## 2017-10-28 DIAGNOSIS — I5022 Chronic systolic (congestive) heart failure: Secondary | ICD-10-CM

## 2017-10-28 DIAGNOSIS — E785 Hyperlipidemia, unspecified: Secondary | ICD-10-CM

## 2017-10-28 MED ORDER — CARVEDILOL 6.25 MG PO TABS: 6.2500 mg | ORAL_TABLET | Freq: Two times a day (BID) | ORAL | 3 refills | Status: DC

## 2017-10-28 MED ORDER — NITROGLYCERIN 0.4 MG SL SUBL: 0.4000 mg | SUBLINGUAL_TABLET | SUBLINGUAL | 3 refills | Status: DC | PRN

## 2017-10-28 NOTE — Progress Notes (Signed)
Follow-up Outpatient Visit Date: 10/28/2017  Primary Care Provider: Patient, [name] No address on file  Chief Complaint: Follow-up coronary artery disease  HPI:  [name] is a 62 y.o. year-old male with history of CAD with late presenting anterior STEMI (01/2017) status post primary PCI to the proximal LAD, ischemic cardiomyopathy with chronic systolic heart failure, and borderline diabetes mellitus, who presents for follow-up of CAD and cardiomyopathy.  He was last seen in our office in May by Christell Faith, PA.  At the time, he was doing well other than a 2-week history of left shoulder pain associated with certain movements and palpation.  He did not have any chest pain or shortness of breath.  Today, [name] reports that he continues to get stronger and is feeling fairly well.  Over the last month, he has noted some vague chest discomfort when he walks very quickly.  The pain will often last several hours or even the entire day before resolving.  It is different than what he experienced at the time of his STEMI last year.  It does not radiate and is not accompanied by other symptoms such as shortness of breath, palpitations, and nausea.  He has been compliant with his medications and is tolerating them well.  He denies dyspnea, orthopnea, PND, and edema.  --------------------------------------------------------------------------------------------------  Cardiovascular History & Procedures: Cardiovascular Problems:  Coronary artery disease with a late presenting anterior STEMI (01/2017)  Ischemic cardiomyopathy  Risk Factors:  Known coronary artery disease, male gender, and age greater than 49  Cath/PCI:  LHC/PCI (01/25/17): LMCA with 20% distal stenosis. LAD with 20% ostial disease followed by thrombotic occlusion of the proximal vessel. 25% mid LAD disease and 50% ostial D1 stenosis present. LCx normal. RCA with 30% proximal stenosis. Successful PCI to the  proximal LAD with placement of a Xience [place] 3.0 x 23 mm drug-eluting stent postdilated with a 3.5 mm  balloon at high pressure.  CV Surgery:  None  EP Procedures and Devices:  None  Non-Invasive Evaluation(s):  TTE (05/29/2017): Normal LV size.  LVEF 40-45% with anterior, anteroseptal, and apical hypokinesis and grade 1 diastolic dysfunction.  Mild MR.  Normal RV size and function.  Normal pulmonary artery pressure.  TTE (01/26/17): Normal LV size with moderate LVH. LVEF 30-35% with severe anterior, anteroseptal, and apical hypokinesis. Grade 1 diastolic dysfunction. Mild MR. Normal RV size and function.  Recent CV Pertinent Labs: Lab Results  Component Value Date   CHOL 81 05/11/2017   HDL 29 (L) 05/11/2017   LDLCALC 29 05/11/2017   TRIG 117 05/11/2017   CHOLHDL 2.8 05/11/2017   INR 1.05 01/25/2017   K 4.4 07/16/2017   MG 2.0 01/28/2017   BUN 11 07/16/2017   CREATININE 0.78 07/16/2017    Past medical and surgical history were reviewed and updated in EPIC.  Current Meds  Medication Sig  . aspirin 81 MG chewable tablet CHEW ONE TABLET BY MOUTH EVERY DAY  . atorvastatin (LIPITOR) 80 MG tablet Take 0.5 tablets (40 mg total) by mouth daily.  . carvedilol (COREG) 3.125 MG tablet Take 1 tablet (3.125 mg total) by mouth 2 (two) times daily with a meal.  . clopidogrel (PLAVIX) 75 MG tablet Take 1 tablet (75 mg total) by mouth daily. Begin the day after taking 300mg  Clopidogrel.  . furosemide (LASIX) 20 MG tablet Take 1 tablet (20 mg total) by mouth daily.  . metFORMIN (GLUCOPHAGE) 500 MG tablet TAKE ONE TABLET BY MOUTH EVERY DAY WITH  BREAKFAST  . potassium chloride (K-DUR,KLOR-CON) 10 MEQ tablet Take 1 tablet (10 mEq total) by mouth daily.  . sacubitril-valsartan (ENTRESTO) 24-26 MG Take 1 tablet by mouth 2 (two) times daily.  Marland Kitchen spironolactone (ALDACTONE) 25 MG tablet Take 0.5 tablets (12.5 mg total) by mouth daily.    Allergies: Patient has no known  allergies.  Social History   Tobacco Use  . Smoking status: Never Smoker  . Smokeless tobacco: Never Used  Substance Use Topics  . Alcohol use: No    Frequency: Never  . Drug use: No    Family History  Problem Relation Age of Onset  . Heart disease Father        s/p pacemaker  . Arrhythmia Father   . Asthma Father   . Asthma Mother     Review of Systems: A 12-system review of systems was performed and was negative except as noted in the HPI.  --------------------------------------------------------------------------------------------------  Physical Exam: BP 118/68 (BP Location: Left Arm, Patient Position: Sitting, Cuff Size: Normal)   Pulse 67   Ht 5' (1.524 m)   Wt 147 lb (66.7 kg)   BMI 28.71 kg/m   General: NAD.  Accompanied by his son and Spanish interpreter. HEENT: No conjunctival pallor or scleral icterus. Moist mucous membranes.  OP clear. Neck: Supple without lymphadenopathy, thyromegaly, JVD, or HJR. Lungs: Normal work of breathing. Clear to auscultation bilaterally without wheezes or crackles. Heart: Regular rate and rhythm without murmurs, rubs, or gallops. Non-displaced PMI. Abd: Bowel sounds present. Soft, NT/ND without hepatosplenomegaly Ext: No lower extremity edema. Radial, PT, and DP pulses are 2+ bilaterally. Skin: Warm and dry without rash.  EKG: Normal sinus rhythm with low voltage and poor R wave progression in V1 and V2.  Lab Results  Component Value Date   WBC 8.5 01/28/2017   HGB 14.8 01/28/2017   HCT 43.7 01/28/2017   MCV 89.8 01/28/2017   PLT 195 01/28/2017    Lab Results  Component Value Date   NA 137 07/16/2017   K 4.4 07/16/2017   CL 102 07/16/2017   CO2 21 07/16/2017   BUN 11 07/16/2017   CREATININE 0.78 07/16/2017   GLUCOSE 115 (H) 07/16/2017   ALT 28 05/11/2017    Lab Results  Component Value Date   CHOL 81 05/11/2017   HDL 29 (L) 05/11/2017   LDLCALC 29 05/11/2017   TRIG 117 05/11/2017   CHOLHDL 2.8 05/11/2017     --------------------------------------------------------------------------------------------------  ASSESSMENT AND PLAN: Coronary artery disease with angina [name] reports 1 month history of exertional chest pain different than what he experienced at the time of his MI.  Overall, he is continuing to feel better.  EKG today shows septal Q waves in the low voltage.  We have agreed to increase carvedilol for antianginal therapy and obtain an exercise tolerance test.  [name] should continue aspirin and clopidogrel for at least 12 months from the time of his MI secondary prevention.  Chronic systolic heart failure due to ischemic cardiomyopathy [name] appears euvolemic with NYHA class II heart failure symptoms.  We will increase carvedilol to 6.25 mg twice daily.  He should continue current doses of Entresto, spironolactone, and furosemide.  I will check a basic metabolic panel today to ensure stable renal function and potassium.  Of note, LVEF had improved to 40 to 45% on repeat echo in March; no indication for ICD placement.  Hyperlipidemia LDL very well controlled on last check.  Continue atorvastatin 40 mg daily.  Follow-up: Return to clinic in 1 month to reassess symptoms and review results of ETT.  Nelva Bush, MD 10/28/2017 2:54 PM

## 2017-10-28 NOTE — Patient Instructions (Addendum)
Medication Instructions:  Your physician has recommended you make the following change in your medication:  1- INCREASE Carvedilol to 6.25 mg by mouth two times a day. 2- Nitroglycerin as needed - Dissolve 1 tablet under the tongue every 5 minutes as needed for chest pain. Do not take more than 3 doses.   Labwork: Your physician recommends that you return for lab work in: [place] - BMET.   Testing/Procedures: Your physician has requested that you have an exercise tolerance test. For further information please visit [email or web address]. Please also follow instruction sheet, as given.   DO NOT drink or eat foods with caffeine for 24 hours before the test. (Chocolate, coffee, tea, decaf coffee/tea, or energy drinks)  DO NOT smoke for 4 hours before your test.  If you use an inhaler, bring it with you to the test.  Wear comfortable shoes and clothing. Women do not wear dresses.  DO NOT TAKE CARVEDILOL THE NIGHT BEFORE OR MORNING OF THE PROCEDURE.    Follow-Up: Your physician recommends that you schedule a follow-up appointment in: [place].  If you need a refill on your cardiac medications before your next appointment, please call your pharmacy.    Electrocardiograma de esfuerzo (Exercise Stress Electrocardiogram) Un electrocardiograma de esfuerzo es una prueba que se realiza para evaluar el suministro de sangre que va al corazn. Esta prueba tambin se denomina ergometra. La prueba se realiza mientras usted camina sobre una [place]. El objetivo de esta prueba es aumentar la frecuencia cardaca. Esta prueba se realiza para Community education officer en donde el flujo sanguneo hacia el corazn es deficiente al determinar la magnitud de la enfermedad arterial coronaria (EAC). La enfermedad arterial coronaria se define como el estrechamiento de una o ms arterias del corazn (coronarias) en ms del 70%. Si obtiene un resultado anormal en la prueba, esto podra indicar que  su corazn no est recibiendo el flujo sanguneo adecuado durante el ejercicio. Es posible que se necesiten ms estudios para comprender el motivo por el que su prueba arroj un resultado anormal. Catha Nottingham A SU MDICO:  Cualquier alergia que tenga.  Todos los [employer] [place], incluyendo vitaminas, hierbas, gotas oftlmicas, cremas y medicamentos de venta libre.  Problemas previos que usted o los [employer] de su familia hayan tenido con el uso de anestsicos.  Enfermedades de [employer].  Cirugas previas.  Enfermedades patolgicas.  Posibilidad de embarazo, si corresponde.  RIESGOS Y COMPLICACIONES En general, se trata de un procedimiento seguro. Sin embargo, Games developer procedimiento, pueden surgir complicaciones. Las complicaciones posibles podran ser:  Dolor o presin en las siguientes zonas: ? El pecho. ? La mandbula o el cuello. ? Entre los omplatos. ? El dolor se extiende hacia el brazo izquierdo.  Mareos o sensacin de desvanecimiento.  Falta de aire.  Latidos cardacos rpidos o irregulares.  Nuseas o vmitos.  Ataque cardaco (poco frecuente). ANTES DEL PROCEDIMIENTO  Evite todo tipo de producto con cafena durante las 24horas previas al estudio o segn las indicaciones del mdico. Esto incluye caf, t (incluso el t descafeinado), gaseosas con cafena, chocolate, cacao y ciertos analgsicos.  Siga las instrucciones del mdico respecto de lo que debe comer y beber antes del [place].  Tome los medicamentos con agua en las horas habituales, segn las indicaciones, salvo que le indiquen lo contrario. Entre las excepciones se incluyen las siguientes: ? Si tiene diabetes, pregunte cmo debe administrarse la insulina o tomar los comprimidos. Es habitual que se ajusten las dosis  de insulina en la maana del [place]. ? Si toma betabloqueantes, es importante que hable con el mdico sobre estos medicamentos antes de la fecha del [place]. Los betabloqueantes  pueden interferir en los [place]. En algunos casos, deben cambiar o suspender la dosis de [employer] 24horas o ms antes del estudio. ? Si [place] un parche de nitroglicerina, posiblemente se lo deba quitar antes del estudio. Pregntele al mdico si debe quitarse el parche antes del [place].  Si sufre alguna enfermedad respiratoria y [place] un inhalador, Air cabin crew en el momento del [place].  Si es un paciente ambulatorio, tenga en cuenta que despus de Customer service manager fase de esfuerzo (ejercicio) del estudio, deber comer algo, por lo que recomendamos traer algn bocadillo.  No fume durante las 4horas previas al estudio o segn las indicaciones del mdico.  No se aplique lociones, polvos, cremas ni aceites en el pecho antes del estudio.  Use ropa suelta y zapatos cmodos para la prueba. Esta prueba consiste en caminar sobre una cinta caminadora.  PROCEDIMIENTO  Le colocarn varios parches (electrodos) en el pecho. Si es necesario, es posible que le afeiten pequeas zonas en el pecho para que los electrodos se Chief Financial Officer. Una vez que los electrodos estn colocados en el cuerpo, conectarn varios cables a los electrodos y controlarn su frecuencia cardaca.  Su corazn se TEFL teacher se encuentra en reposo y Nature conservation officer se Psychologist, forensic.  Caminar sobre una [place]. Comenzar a un ritmo lento. La velocidad y la pendiente de la cinta caminadora irn aumentando gradualmente para elevar su frecuencia cardaca.  DESPUS DEL PROCEDIMIENTO  Despus del estudio, se le controlar la frecuencia cardaca y la presin arterial.  Puede retomar su rutina normal, incluidos dieta, actividades y Chief Strategy Officer, a menos que su mdico le indique lo contrario.  Esta informacin no tiene Marine scientist el consejo del mdico. Asegrese de hacerle al mdico cualquier pregunta que tenga. Document Released: 11/27/2004 Document Revised: 02/22/2013 Document Reviewed:  10/25/2012 Elsevier Interactive Patient Education  2017 [employer].

## 2017-10-29 ENCOUNTER — Encounter: Payer: Self-pay | Admitting: Internal Medicine

## 2017-10-29 DIAGNOSIS — I2510 Atherosclerotic heart disease of native coronary artery without angina pectoris: Secondary | ICD-10-CM | POA: Insufficient documentation

## 2017-10-29 DIAGNOSIS — I25119 Atherosclerotic heart disease of native coronary artery with unspecified angina pectoris: Principal | ICD-10-CM

## 2017-10-29 DIAGNOSIS — I25118 Atherosclerotic heart disease of native coronary artery with other forms of angina pectoris: Secondary | ICD-10-CM | POA: Insufficient documentation

## 2017-10-29 LAB — BASIC METABOLIC PANEL
BUN/Creatinine Ratio: 14 (ref 9–20)
BUN: 12 mg/dL (ref 6–24)
CALCIUM: 9.5 mg/dL (ref 8.7–10.2)
CO2: 25 mmol/L (ref 20–29)
Chloride: 100 mmol/L (ref 96–106)
Creatinine, Ser: 0.85 mg/dL (ref 0.76–1.27)
GFR, EST AFRICAN AMERICAN: 110 mL/min/{1.73_m2} (ref >59)
GFR, EST NON AFRICAN AMERICAN: 95 mL/min/{1.73_m2} (ref >59)
Glucose: 113 mg/dL — ABNORMAL HIGH (ref 65–99)
Potassium: 4.3 mmol/L (ref 3.5–5.2)
Sodium: 142 mmol/L (ref 134–144)

## 2017-11-05 ENCOUNTER — Telehealth: Payer: Self-pay | Admitting: *Deleted

## 2017-11-05 NOTE — Telephone Encounter (Signed)
Attempted to reach the patient to provide instructions for the ETT tomorrow. There was no answer and voicemail was unavailable.

## 2017-11-06 ENCOUNTER — Ambulatory Visit (INDEPENDENT_AMBULATORY_CARE_PROVIDER_SITE_OTHER): Payer: Self-pay

## 2017-11-06 DIAGNOSIS — I25119 Atherosclerotic heart disease of native coronary artery with unspecified angina pectoris: Secondary | ICD-10-CM

## 2017-11-10 LAB — EXERCISE TOLERANCE TEST
CHL CUP MPHR: 161 {beats}/min
CHL CUP RESTING HR STRESS: 70 {beats}/min
CHL RATE OF PERCEIVED EXERTION: 19
Estimated workload: 10.8 METS
Exercise duration (min): 9 min
Exercise duration (sec): 29 s
Peak HR: 144 {beats}/min
Percent HR: 89 %

## 2017-11-12 ENCOUNTER — Telehealth: Payer: Self-pay | Admitting: *Deleted

## 2017-11-12 NOTE — Telephone Encounter (Signed)
-----   Message from Nelva Bush, MD sent at 11/10/2017  6:55 AM EDT ----- Please let [name] know that his stress test is abnormal with subtle changes that could be related to his prior heart attack.  However, a new blockage is also a concern.  I recommend that he continue his current medications, including recently increased dose of carvedilol.  Can we try to move up his f/u appointment to see me or an APP to 1-2 weeks from now?  We will reassess his symptoms at that time and discuss the need for further testing (i.e. Catheterization).

## 2017-11-12 NOTE — Telephone Encounter (Signed)
Attempted to reach patient using [place] number [phone]. No answer or voicemail at first two numbers. Last number left number to call back.

## 2017-11-16 NOTE — Telephone Encounter (Signed)
Attempted to reach the patient using [employer] ID #- [phone].  Called [phone]- unable to leave message as the voice mail is full.  Called [phone]- left a message to call back at this #.

## 2017-11-19 ENCOUNTER — Inpatient Hospital Stay
Admission: EM | Admit: 2017-11-19 | Discharge: 2017-11-20 | DRG: 287 | Disposition: A | Discharge status: Home / Self Care | Payer: Self-pay | Attending: Internal Medicine | Admitting: Internal Medicine

## 2017-11-19 ENCOUNTER — Encounter: Payer: Self-pay | Admitting: Emergency Medicine

## 2017-11-19 ENCOUNTER — Other Ambulatory Visit: Payer: Self-pay

## 2017-11-19 ENCOUNTER — Emergency Department: Payer: Self-pay

## 2017-11-19 DIAGNOSIS — X58XXXA Exposure to other specified factors, initial encounter: Secondary | ICD-10-CM | POA: Diagnosis present

## 2017-11-19 DIAGNOSIS — I200 Unstable angina: Secondary | ICD-10-CM | POA: Diagnosis present

## 2017-11-19 DIAGNOSIS — T463X5A Adverse effect of coronary vasodilators, initial encounter: Secondary | ICD-10-CM | POA: Diagnosis not present

## 2017-11-19 DIAGNOSIS — I25110 Atherosclerotic heart disease of native coronary artery with unstable angina pectoris: Principal | ICD-10-CM | POA: Diagnosis present

## 2017-11-19 DIAGNOSIS — R001 Bradycardia, unspecified: Secondary | ICD-10-CM | POA: Diagnosis present

## 2017-11-19 DIAGNOSIS — E876 Hypokalemia: Secondary | ICD-10-CM | POA: Diagnosis not present

## 2017-11-19 DIAGNOSIS — I252 Old myocardial infarction: Secondary | ICD-10-CM

## 2017-11-19 DIAGNOSIS — Z7982 Long term (current) use of aspirin: Secondary | ICD-10-CM

## 2017-11-19 DIAGNOSIS — R51 Headache: Secondary | ICD-10-CM | POA: Diagnosis not present

## 2017-11-19 DIAGNOSIS — Z23 Encounter for immunization: Secondary | ICD-10-CM

## 2017-11-19 DIAGNOSIS — Z7902 Long term (current) use of antithrombotics/antiplatelets: Secondary | ICD-10-CM

## 2017-11-19 DIAGNOSIS — I959 Hypotension, unspecified: Secondary | ICD-10-CM | POA: Diagnosis not present

## 2017-11-19 DIAGNOSIS — F419 Anxiety disorder, unspecified: Secondary | ICD-10-CM | POA: Diagnosis present

## 2017-11-19 DIAGNOSIS — Z955 Presence of coronary angioplasty implant and graft: Secondary | ICD-10-CM

## 2017-11-19 DIAGNOSIS — I255 Ischemic cardiomyopathy: Secondary | ICD-10-CM | POA: Diagnosis present

## 2017-11-19 DIAGNOSIS — Z825 Family history of asthma and other chronic lower respiratory diseases: Secondary | ICD-10-CM

## 2017-11-19 DIAGNOSIS — E1165 Type 2 diabetes mellitus with hyperglycemia: Secondary | ICD-10-CM | POA: Diagnosis present

## 2017-11-19 DIAGNOSIS — Z7984 Long term (current) use of oral hypoglycemic drugs: Secondary | ICD-10-CM

## 2017-11-19 DIAGNOSIS — Z79899 Other long term (current) drug therapy: Secondary | ICD-10-CM

## 2017-11-19 DIAGNOSIS — I5042 Chronic combined systolic (congestive) and diastolic (congestive) heart failure: Secondary | ICD-10-CM | POA: Diagnosis present

## 2017-11-19 DIAGNOSIS — E785 Hyperlipidemia, unspecified: Secondary | ICD-10-CM | POA: Diagnosis present

## 2017-11-19 DIAGNOSIS — Z8249 Family history of ischemic heart disease and other diseases of the circulatory system: Secondary | ICD-10-CM

## 2017-11-19 DIAGNOSIS — I110 Hypertensive heart disease with heart failure: Secondary | ICD-10-CM | POA: Diagnosis present

## 2017-11-19 LAB — PROTIME-INR
INR: 1.09
Prothrombin Time: 14 seconds (ref 11.4–15.2)

## 2017-11-19 LAB — BASIC METABOLIC PANEL
Anion gap: 10 (ref 5–15)
BUN: 14 mg/dL (ref 6–20)
CALCIUM: 9.3 mg/dL (ref 8.9–10.3)
CO2: 27 mmol/L (ref 22–32)
Chloride: 103 mmol/L (ref 98–111)
Creatinine, Ser: 0.91 mg/dL (ref 0.61–1.24)
GFR calc Af Amer: >60 mL/min (ref >60)
GLUCOSE: 111 mg/dL — AB (ref 70–99)
POTASSIUM: 4 mmol/L (ref 3.5–5.1)
Sodium: 140 mmol/L (ref 135–145)

## 2017-11-19 LAB — CBC
HEMATOCRIT: 45.5 % (ref 40.0–52.0)
Hemoglobin: 16.4 g/dL (ref 13.0–18.0)
MCH: 33 pg (ref 26.0–34.0)
MCHC: 35.9 g/dL (ref 32.0–36.0)
MCV: 91.9 fL (ref 80.0–100.0)
Platelets: 212 10*3/uL (ref 150–440)
RBC: 4.95 MIL/uL (ref 4.40–5.90)
RDW: 13.1 % (ref 11.5–14.5)
WBC: 6.5 10*3/uL (ref 3.8–10.6)

## 2017-11-19 LAB — GLUCOSE, CAPILLARY: GLUCOSE-CAPILLARY: 133 mg/dL — AB (ref 70–99)

## 2017-11-19 LAB — APTT: aPTT: 35 seconds (ref 24–36)

## 2017-11-19 LAB — TROPONIN I: Troponin I: <0.03 ng/mL (ref <0.03)

## 2017-11-19 MED ORDER — HEPARIN BOLUS VIA INFUSION
3800.0000 [IU] | Freq: Once | INTRAVENOUS | Status: AC
  Administered 2017-11-19: 3800 [IU] via INTRAVENOUS
  Filled 2017-11-19: qty 3800

## 2017-11-19 MED ORDER — SPIRONOLACTONE 25 MG PO TABS
12.5000 mg | ORAL_TABLET | Freq: Every day | ORAL | Status: DC
  Administered 2017-11-20: 12.5 mg via ORAL
  Filled 2017-11-19: qty 0.5
  Filled 2017-11-19: qty 1

## 2017-11-19 MED ORDER — NITROGLYCERIN IN D5W 200-5 MCG/ML-% IV SOLN
0.0000 ug/min | Freq: Once | INTRAVENOUS | Status: AC
  Administered 2017-11-19: 5 ug/min via INTRAVENOUS
  Filled 2017-11-19: qty 250

## 2017-11-19 MED ORDER — CLOPIDOGREL BISULFATE 75 MG PO TABS
75.0000 mg | ORAL_TABLET | Freq: Every day | ORAL | Status: DC
  Administered 2017-11-20: 75 mg via ORAL
  Filled 2017-11-19: qty 1

## 2017-11-19 MED ORDER — CARVEDILOL 6.25 MG PO TABS: 6.2500 mg | ORAL_TABLET | Freq: Two times a day (BID) | ORAL | Status: DC

## 2017-11-19 MED ORDER — ACETAMINOPHEN 325 MG PO TABS
650.0000 mg | ORAL_TABLET | ORAL | Status: DC | PRN
  Administered 2017-11-20: 650 mg via ORAL
  Filled 2017-11-19: qty 2

## 2017-11-19 MED ORDER — INSULIN ASPART 100 UNIT/ML ~~LOC~~ SOLN: 0.0000 [IU] | Freq: Three times a day (TID) | SUBCUTANEOUS | Status: DC

## 2017-11-19 MED ORDER — ATORVASTATIN CALCIUM 20 MG PO TABS
40.0000 mg | ORAL_TABLET | Freq: Every day | ORAL | Status: DC
  Administered 2017-11-20: 40 mg via ORAL
  Filled 2017-11-19: qty 2

## 2017-11-19 MED ORDER — INSULIN ASPART 100 UNIT/ML ~~LOC~~ SOLN: 0.0000 [IU] | Freq: Every day | SUBCUTANEOUS | Status: DC

## 2017-11-19 MED ORDER — ASPIRIN 300 MG RE SUPP: 300.0000 mg | RECTAL | Status: AC

## 2017-11-19 MED ORDER — FUROSEMIDE 20 MG PO TABS: 20.0000 mg | ORAL_TABLET | Freq: Every day | ORAL | Status: DC

## 2017-11-19 MED ORDER — ONDANSETRON HCL 4 MG/2ML IJ SOLN: 4.0000 mg | Freq: Four times a day (QID) | INTRAMUSCULAR | Status: DC | PRN

## 2017-11-19 MED ORDER — INFLUENZA VAC SPLIT QUAD 0.5 ML IM SUSY
0.5000 mL | PREFILLED_SYRINGE | INTRAMUSCULAR | Status: AC
  Administered 2017-11-20: 0.5 mL via INTRAMUSCULAR
  Filled 2017-11-19: qty 0.5

## 2017-11-19 MED ORDER — SACUBITRIL-VALSARTAN 24-26 MG PO TABS
1.0000 | ORAL_TABLET | Freq: Two times a day (BID) | ORAL | Status: DC
  Administered 2017-11-19: 1 via ORAL
  Filled 2017-11-19: qty 1

## 2017-11-19 MED ORDER — ASPIRIN 81 MG PO CHEW
81.0000 mg | CHEWABLE_TABLET | Freq: Every day | ORAL | Status: DC
  Administered 2017-11-20: 81 mg via ORAL
  Filled 2017-11-19: qty 1

## 2017-11-19 MED ORDER — ASPIRIN 81 MG PO CHEW
324.0000 mg | CHEWABLE_TABLET | ORAL | Status: AC
  Administered 2017-11-19: 324 mg via ORAL
  Filled 2017-11-19: qty 4

## 2017-11-19 MED ORDER — HEPARIN (PORCINE) IN NACL 100-0.45 UNIT/ML-% IJ SOLN
750.0000 [IU]/h | INTRAMUSCULAR | Status: DC
  Administered 2017-11-19: 750 [IU]/h via INTRAVENOUS
  Filled 2017-11-19: qty 250

## 2017-11-19 NOTE — ED Triage Notes (Signed)
Patient to ER for c/o pain to chest and headache since 3 days ago. +Dizziness, shortness of breath, episodes of diaphoresis. Denies any nausea or diarrhea. Also reports sore throat. Reports "some fevers". States symptoms are similar to previous MI (STEMI in past).

## 2017-11-19 NOTE — Telephone Encounter (Signed)
Patient returning call for results Please call back with interpreter

## 2017-11-19 NOTE — H&P (Signed)
[facility] [place] NAME: Lucas [name]    MR#:  [id number]  DATE OF BIRTH:  12-22-1955  DATE OF ADMISSION:  11/19/2017  PRIMARY CARE PHYSICIAN: Patient, [name]   REQUESTING/REFERRING PHYSICIAN: Rudene Re, MD  CHIEF COMPLAINT:   Chief Complaint  Patient presents with  . Chest Pain  . Headache    HISTORY OF PRESENT ILLNESS:  Lucas [name]  is a 62 y.o. male with a known history of CAD s/p PCI to LAD, ischemic cardiomyopathy with chronic systolic heart failure (EF 30-35%), hypertension, T2DM, and hyperlipidemia who presented to the ED with chest pain for the last 3 days. The chest pain is located in the center of his chest. The pain feels "tight". The chest pain radiates to both sides of his jaw. The pain is worse with walking and better with rest. The pain is constant since it started. He has associated shortness of breath. No nausea or diaphoresis.  Of note, he was seen by his cardiologist on 10/28/17. He had a stress test done on 11/06/17 that was abnormal and showed 78mm ST depression in the inferior leads. He was asked to come back to the cardiologist's office in 1 week to discuss further testing, but he was unable to come in until 10/1.  In the ED, initial EKG was unremarkable. Initial troponin was negative. He was started on a heparin drip and nitro drip. Hospitalists were called for admission.  PAST MEDICAL HISTORY:   Past Medical History:  Diagnosis Date  . Acute ST elevation myocardial infarction (STEMI) involving left anterior descending (LAD) coronary artery ([place])    a. 01/2017 Ant STEMI/PCI: LAD 20ost, 100p (3.0x23 Xience Sierra DES).  . CAD (coronary artery disease)    a. 01/2017 Ant STEMI/PCI: LM 20d, LAD 20ost, 100p (3.0x23 Xience Sierra DES), 33m, D1 50ost, LCX nl, RCA 30p.  . Chronic combined systolic (congestive) and diastolic (congestive) heart failure ([place])    a. 01/2018 Echo: EF 30-35%, sev  antsept, ant, apical HK. Gr1 DD, mild MR.  . Essential hypertension   . Hyperlipidemia    a. Myalgias with high dose lipitor - tolerating 40mg  daily.  . Ischemic cardiomyopathy    a. 01/2018 Echo: EF 30-35%.  . Type II diabetes mellitus ([place])     PAST SURGICAL HISTORY:   Past Surgical History:  Procedure Laterality Date  . APPENDECTOMY    . CARDIAC CATHETERIZATION    . CORONARY ANGIOPLASTY    . CORONARY/GRAFT ACUTE MI REVASCULARIZATION N/A 01/25/2017   Procedure: Coronary/Graft Acute MI Revascularization;  Surgeon: Nelva Bush, MD;  Location: [facility];  Service: Cardiovascular;  Laterality: N/A;  . LEFT HEART CATH AND CORONARY ANGIOGRAPHY N/A 01/25/2017   Procedure: LEFT HEART CATH AND CORONARY ANGIOGRAPHY;  Surgeon: Nelva Bush, MD;  Location: [facility];  Service: Cardiovascular;  Laterality: N/A;    SOCIAL HISTORY:   Social History   Tobacco Use  . Smoking status: Never Smoker  . Smokeless tobacco: Never Used  Substance Use Topics  . Alcohol use: No    Frequency: Never    FAMILY HISTORY:   Family History  Problem Relation Age of Onset  . Heart disease Father        s/p pacemaker  . Arrhythmia Father   . Asthma Father   . Asthma Mother     DRUG ALLERGIES:  No Known Allergies  REVIEW OF SYSTEMS:   Review of Systems  Constitutional: Negative  for chills and fever.  HENT: Negative for congestion and sore throat.   Eyes: Negative for blurred vision and double vision.  Respiratory: Positive for shortness of breath. Negative for cough.   Cardiovascular: Positive for chest pain. Negative for leg swelling.  Gastrointestinal: Negative for abdominal pain, nausea and vomiting.  Genitourinary: Negative for dysuria and frequency.  Musculoskeletal: Negative for back pain, falls and neck pain.  Neurological: Positive for tingling. Negative for dizziness.  Psychiatric/Behavioral: Negative for depression. The patient is not nervous/anxious.      MEDICATIONS AT HOME:   Prior to Admission medications   Medication Sig Start Date End Date Taking? Authorizing Provider  aspirin 81 MG chewable tablet CHEW ONE TABLET BY MOUTH EVERY DAY 08/11/17   [name], MD  atorvastatin (LIPITOR) 80 MG tablet Take 0.5 tablets (40 mg total) by mouth daily. 03/10/17   Hubbard Hartshorn, FNP  carvedilol (COREG) 6.25 MG tablet Take 1 tablet (6.25 mg total) by mouth 2 (two) times daily. 10/28/17   End, Harrell Gave, MD  clopidogrel (PLAVIX) 75 MG tablet Take 1 tablet (75 mg total) by mouth daily. Begin the day after taking 300mg  Clopidogrel. 09/14/17   Doles-Johnson, Teah, NP  furosemide (LASIX) 20 MG tablet Take 1 tablet (20 mg total) by mouth daily. 04/29/17 10/28/17  End, Harrell Gave, MD  metFORMIN (GLUCOPHAGE) 500 MG tablet TAKE ONE TABLET BY MOUTH EVERY DAY WITH BREAKFAST 09/28/17   [name], MD  nitroGLYCERIN (NITROSTAT) 0.4 MG SL tablet Place 1 tablet (0.4 mg total) under the tongue every 5 (five) minutes as needed for chest pain. Maximum of 3 doses. 10/28/17 01/26/18  End, Harrell Gave, MD  potassium chloride (K-DUR,KLOR-CON) 10 MEQ tablet Take 1 tablet (10 mEq total) by mouth daily. 06/17/17   Hubbard Hartshorn, FNP  sacubitril-valsartan (ENTRESTO) 24-26 MG Take 1 tablet by mouth 2 (two) times daily. 06/16/17   Alisa Graff, FNP  spironolactone (ALDACTONE) 25 MG tablet Take 0.5 tablets (12.5 mg total) by mouth daily. 03/11/17   Hubbard Hartshorn, FNP      VITAL SIGNS:  Blood pressure 102/72, pulse (!) 50, temperature 98.3 F (36.8 C), temperature source Oral, resp. rate 15, height 5' (1.524 m), weight 63.5 kg, SpO2 98 %.  PHYSICAL EXAMINATION:  Physical Exam  GENERAL:  62 y.o.-year-old patient lying in the bed with no acute distress.  EYES: Pupils equal, round, reactive to light and accommodation. No scleral icterus. Extraocular muscles intact.  HEENT: Head atraumatic, normocephalic. Oropharynx and nasopharynx clear. Moist mucous membranes. NECK:   Supple, no jugular venous distention. No thyroid enlargement, no tenderness.  LUNGS: Normal breath sounds bilaterally, no wheezing, rales,rhonchi or crepitation. No use of accessory muscles of respiration.  CARDIOVASCULAR: bradycardic, regular rhythm, S1, S2 normal. No murmurs, rubs, or gallops.  ABDOMEN: Soft, nontender, nondistended. Bowel sounds present. No organomegaly or mass.  EXTREMITIES: No pedal edema, cyanosis, or clubbing.  NEUROLOGIC: Cranial nerves II through XII are intact. Muscle strength 5/5 in all extremities. Sensation intact. Gait not checked.  PSYCHIATRIC: The patient is alert and oriented x 3.  SKIN: No obvious rash, lesion, or ulcer.   LABORATORY PANEL:   CBC Recent Labs  Lab 11/19/17 1705  WBC 6.5  HGB 16.4  HCT 45.5  PLT 212   ------------------------------------------------------------------------------------------------------------------  Chemistries  Recent Labs  Lab 11/19/17 1705  NA 140  K 4.0  CL 103  CO2 27  GLUCOSE 111*  BUN 14  CREATININE 0.91  CALCIUM 9.3   ------------------------------------------------------------------------------------------------------------------  Cardiac Enzymes Recent Labs  Lab 11/19/17 1705  TROPONINI <0.03   ------------------------------------------------------------------------------------------------------------------  RADIOLOGY:  Dg Chest 2 View  Result Date: 11/19/2017 CLINICAL DATA:  Chest pain EXAM: CHEST - 2 VIEW COMPARISON:  01/25/2017 FINDINGS: The heart size and mediastinal contours are within normal limits. Both lungs are clear. The visualized skeletal structures are unremarkable. IMPRESSION: Clear lungs. Electronically Signed   By: Ulyses Jarred M.D.   On: 11/19/2017 17:16      IMPRESSION AND PLAN:   Unstable angina- concern for ACS given his history and recent abnormal stress test on 11/06/17. Has a history of STEMI with 100% thrombotic occlusion of the proximal LAD s/p PCI/DES. Initial  EKG without any ischemic changes and initial troponin negative. - cardiology consulted - continue heparin and nitro drips - continue aspirin, plavix, and coreg - trend troponins - cardiac monitoring  Chronic systolic heart failure- stable, no signs of volume overload - continue home lasix, coreg, spironolactone, and entresto  Hypertension- normotensive in the ED. - continue coreg  Type 2 diabetes- mildly hyperglycemic in the ED - sensitive SSI  Hyperlipidemia- stable - continue lipitor  All the records are reviewed and case discussed with ED provider. Management plans discussed with the patient, family and they are in agreement.  CODE STATUS: Full  TOTAL TIME TAKING CARE OF THIS PATIENT: 40 minutes.    Lucas [name] M.D on 11/19/2017 at 8:13 PM  Between 7am to 6pm - Pager - [phone]  After 6pm go to [email or web address] - Proofreader  [facility]  Office  [phone]  CC: Primary care physician; Patient, [name]   Note: This dictation was prepared with Dragon dictation along with smaller phrase technology. Any transcriptional errors that result from this process are unintentional.

## 2017-11-19 NOTE — ED Provider Notes (Addendum)
[facility] Emergency Department Provider Note  ____________________________________________  Time seen: Approximately 5:56 PM  I have reviewed the triage vital signs and the nursing notes.   HISTORY  Chief Complaint Chest Pain and Headache   HPI Lucas [name] is a 62 y.o. male with a history of CAD status post stent in 2018, CHF with a EF of 30 to 35%, hypertension, hyperlipidemia, type 2 diabetes who presents for evaluation of chest pain.  Patient has been having intermittent episodes of chest pain for several weeks.  Saw his cardiologist 3 weeks ago and underwent a stress test which was abnormal.  He reports that over the last 3 days his chest pain has become constant he describes this as a pressure in the center of his chest radiating to his neck, bilateral arms and his upper back.  His symptoms are identical to his prior heart attack.  On triage note patient is described as reporting fever and sore throat.  Patient denies fevers at home and denies any sore throat.  He reports that the pain in his chest radiates to his neck.  Unsure if history in triage was taken with the help of the interpreter.  Patient denies nausea or vomiting.  He does report dizziness and shortness of breath and has had intermittent episodes of diaphoresis when the pain becomes very severe.  Currently the pain is 7 out of 10.  Past Medical History:  Diagnosis Date  . Acute ST elevation myocardial infarction (STEMI) involving left anterior descending (LAD) coronary artery ([place])    a. 01/2017 Ant STEMI/PCI: LAD 20ost, 100p (3.0x23 Xience Sierra DES).  . CAD (coronary artery disease)    a. 01/2017 Ant STEMI/PCI: LM 20d, LAD 20ost, 100p (3.0x23 Xience Sierra DES), 66m, D1 50ost, LCX nl, RCA 30p.  . Chronic combined systolic (congestive) and diastolic (congestive) heart failure ([place])    a. 01/2018 Echo: EF 30-35%, sev antsept, ant, apical HK. Gr1 DD, mild MR.  . Essential hypertension     . Hyperlipidemia    a. Myalgias with high dose lipitor - tolerating 40mg  daily.  . Ischemic cardiomyopathy    a. 01/2018 Echo: EF 30-35%.  . Type II diabetes mellitus [facility])     Patient Active Problem List   Diagnosis Date Noted  . Coronary artery disease involving native coronary artery of native heart with angina pectoris ([place]) 10/29/2017  . Supraspinatus tendon tear 08/11/2017  . Ischemic cardiomyopathy 04/29/2017  . Hyperlipidemia LDL goal <70 04/29/2017  . History of ST elevation myocardial infarction (STEMI) 04/14/2017  . Prediabetes 04/14/2017  . Cough 04/14/2017  . Chronic systolic heart failure ([place]) 02/02/2017  . HTN (hypertension) 02/02/2017  . Myalgia 02/02/2017  . STEMI involving left anterior descending coronary artery ([place]) 01/25/2017    Past Surgical History:  Procedure Laterality Date  . APPENDECTOMY    . CARDIAC CATHETERIZATION    . CORONARY ANGIOPLASTY    . CORONARY/GRAFT ACUTE MI REVASCULARIZATION N/A 01/25/2017   Procedure: Coronary/Graft Acute MI Revascularization;  Surgeon: Nelva Bush, MD;  Location: [place] CV [facility];  Service: Cardiovascular;  Laterality: N/A;  . LEFT HEART CATH AND CORONARY ANGIOGRAPHY N/A 01/25/2017   Procedure: LEFT HEART CATH AND CORONARY ANGIOGRAPHY;  Surgeon: Nelva Bush, MD;  Location: [place] CV [facility];  Service: Cardiovascular;  Laterality: N/A;    Prior to Admission medications   Medication Sig Start Date End Date Taking? Authorizing Provider  aspirin 81 MG chewable tablet CHEW ONE TABLET BY MOUTH EVERY DAY 08/11/17  [name], MD  atorvastatin (LIPITOR) 80 MG tablet Take 0.5 tablets (40 mg total) by mouth daily. 03/10/17   Hubbard Hartshorn, FNP  carvedilol (COREG) 6.25 MG tablet Take 1 tablet (6.25 mg total) by mouth 2 (two) times daily. 10/28/17   End, Harrell Gave, MD  clopidogrel (PLAVIX) 75 MG tablet Take 1 tablet (75 mg total) by mouth daily. Begin the day after taking 300mg  Clopidogrel. 09/14/17    Doles-Johnson, Teah, NP  furosemide (LASIX) 20 MG tablet Take 1 tablet (20 mg total) by mouth daily. 04/29/17 10/28/17  End, Harrell Gave, MD  metFORMIN (GLUCOPHAGE) 500 MG tablet TAKE ONE TABLET BY MOUTH EVERY DAY WITH BREAKFAST 09/28/17   [name], MD  nitroGLYCERIN (NITROSTAT) 0.4 MG SL tablet Place 1 tablet (0.4 mg total) under the tongue every 5 (five) minutes as needed for chest pain. Maximum of 3 doses. 10/28/17 01/26/18  End, Harrell Gave, MD  potassium chloride (K-DUR,KLOR-CON) 10 MEQ tablet Take 1 tablet (10 mEq total) by mouth daily. 06/17/17   Hubbard Hartshorn, FNP  sacubitril-valsartan (ENTRESTO) 24-26 MG Take 1 tablet by mouth 2 (two) times daily. 06/16/17   Alisa Graff, FNP  spironolactone (ALDACTONE) 25 MG tablet Take 0.5 tablets (12.5 mg total) by mouth daily. 03/11/17   Hubbard Hartshorn, FNP    Allergies Patient has no known allergies.  Family History  Problem Relation Age of Onset  . Heart disease Father        s/p pacemaker  . Arrhythmia Father   . Asthma Father   . Asthma Mother     Social History Social History   Tobacco Use  . Smoking status: Never Smoker  . Smokeless tobacco: Never Used  Substance Use Topics  . Alcohol use: No    Frequency: Never  . Drug use: No    Review of Systems  Constitutional: Negative for fever. Eyes: Negative for visual changes. ENT: Negative for sore throat. Neck: No neck pain  Cardiovascular: + chest pain. Respiratory: + shortness of breath. Gastrointestinal: Negative for abdominal pain, vomiting or diarrhea. Genitourinary: Negative for dysuria. Musculoskeletal: Negative for back pain. Skin: Negative for rash. Neurological: Negative for headaches, weakness or numbness. Psych: No SI or HI  ____________________________________________   PHYSICAL EXAM:  VITAL SIGNS: ED Triage Vitals  Enc Vitals Group     BP 11/19/17 1701 (!) 128/93     Pulse Rate 11/19/17 1701 61     Resp 11/19/17 1701 20     Temp 11/19/17 1701  98.3 F (36.8 C)     Temp Source 11/19/17 1701 Oral     SpO2 11/19/17 1701 98 %     Weight 11/19/17 1702 140 lb (63.5 kg)     Height 11/19/17 1702 5' (1.524 m)     Head Circumference --      Peak Flow --      Pain Score 11/19/17 1702 8     Pain Loc --      Pain Edu? --      Excl. in [place]? --     Constitutional: Alert and oriented. Well appearing and in no apparent distress. HEENT:      Head: Normocephalic and atraumatic.         Eyes: Conjunctivae are normal. Sclera is non-icteric.       Mouth/Throat: Mucous membranes are moist.       Neck: Supple with no signs of meningismus. Cardiovascular: Regular rate and rhythm. No murmurs, gallops, or rubs. 2+ symmetrical distal pulses  are present in all extremities. No JVD. Respiratory: Normal respiratory effort. Lungs are clear to auscultation bilaterally. No wheezes, crackles, or rhonchi.  Gastrointestinal: Soft, non tender, and non distended with positive bowel sounds. No rebound or guarding. Musculoskeletal: Nontender with normal range of motion in all extremities. No edema, cyanosis, or erythema of extremities. Neurologic: Normal speech and language. Face is symmetric. Moving all extremities. No gross focal neurologic deficits are appreciated. Skin: Skin is warm, dry and intact. No rash noted. Psychiatric: Mood and affect are normal. Speech and behavior are normal.  ____________________________________________   LABS (all labs ordered are listed, but only abnormal results are displayed)  Labs Reviewed  BASIC METABOLIC PANEL - Abnormal; Notable for the following components:      Result Value   Glucose, Bld 111 (*)    All other components within normal limits  CBC  TROPONIN I   ____________________________________________  EKG  ED ECG REPORT I, Rudene Re, the attending physician, personally viewed and interpreted this ECG.  Sinus bradycardia, rate 56, normal intervals, normal axis, no ST elevations or depressions, Q  waves in anterior leads.  Unchanged from prior. ____________________________________________  RADIOLOGY  I have personally reviewed the images performed during this visit and I agree with the Radiologist's read.   Interpretation by Radiologist:  Dg Chest 2 View  Result Date: 11/19/2017 CLINICAL DATA:  Chest pain EXAM: CHEST - 2 VIEW COMPARISON:  01/25/2017 FINDINGS: The heart size and mediastinal contours are within normal limits. Both lungs are clear. The visualized skeletal structures are unremarkable. IMPRESSION: Clear lungs. Electronically Signed   By: Ulyses Jarred M.D.   On: 11/19/2017 17:16     ____________________________________________   PROCEDURES  Procedure(s) performed: None Procedures Critical Care performed: yes  CRITICAL CARE Performed by: Rudene Re  ?  Total critical care time: 30 min  Critical care time was exclusive of separately billable procedures and treating other patients.  Critical care was necessary to treat or prevent imminent or life-threatening deterioration.  Critical care was time spent personally by me on the following activities: development of treatment plan with patient and/or surrogate as well as nursing, discussions with consultants, evaluation of patient's response to treatment, examination of patient, obtaining history from patient or surrogate, ordering and performing treatments and interventions, ordering and review of laboratory studies, ordering and review of radiographic studies, pulse oximetry and re-evaluation of patient's condition.   ____________________________________________   INITIAL IMPRESSION / ASSESSMENT AND PLAN / ED COURSE   63 y.o. male with a history of CAD status post stent in 2018, CHF with a EF of 30 to 35%, hypertension, hyperlipidemia, type 2 diabetes who presents for evaluation of chest pain.  High risk chest pain, similar to prior STEMI with a recent abnormal stress test 9 days ago.  Initial EKG with  no evidence of STEMI.  First troponin is negative.  Will start patient on heparin drip, nitroglycerin drip and admitted to the hospitalist service for further evaluation.  Patient is followed by Dr. Saunders Revel cardiology.    _________________________ 6:49 PM on 11/19/2017 -----------------------------------------  Pain has resolved on a nitro drip.  Discussed with cardiologist Dr. Rayann Heman who agrees with management and recommended keeping patient on the nitro drip and heparin and consult with Dr. Saunders Revel who is patient's cardiologist in the morning.   As part of my medical decision making, I reviewed the following data within the [place] notes reviewed and incorporated, Labs reviewed , EKG interpreted , Old EKG reviewed,  Old chart reviewed, Radiograph reviewed , Discussed with admitting physician , Notes from prior ED visits and [place] Controlled Substance Database    Pertinent labs & imaging results that were available during my care of the patient were reviewed by me and considered in my medical decision making (see chart for details).    ____________________________________________   FINAL CLINICAL IMPRESSION(S) / ED DIAGNOSES  Final diagnoses:  Unstable angina ([place])      NEW MEDICATIONS STARTED DURING THIS VISIT:  ED Discharge Orders    None       Note:  This document was prepared using Dragon voice recognition software and may include unintentional dictation errors.    Alfred Levins, [place], MD 11/19/17 Duayne Cal, [place], MD 11/19/17 [phone]

## 2017-11-19 NOTE — ED Notes (Signed)
MD to bedside with update. Pt and family at bedside aware of POC

## 2017-11-19 NOTE — Progress Notes (Addendum)
ANTICOAGULATION CONSULT NOTE - Initial Consult  Pharmacy Consult for Heparin Drip Indication: chest pain/ACS  No Known Allergies  Patient Measurements: Height: 5' (152.4 cm) Weight: 140 lb (63.5 kg) IBW/kg (Calculated) : 50 Heparin Dosing Weight: 62.8 kg  Vital Signs: Temp: 98.3 F (36.8 C) (09/19 1701) Temp Source: Oral (09/19 1701) BP: 123/79 (09/19 1800) Pulse Rate: 58 (09/19 1800)  Labs: Recent Labs    11/19/17 1705  HGB 16.4  HCT 45.5  PLT 212  CREATININE 0.91  TROPONINI <0.03    Estimated Creatinine Clearance: 67.6 mL/min (by C-G formula based on SCr of 0.91 mg/dL).   Medical History: Past Medical History:  Diagnosis Date  . Acute ST elevation myocardial infarction (STEMI) involving left anterior descending (LAD) coronary artery ([place])    a. 01/2017 Ant STEMI/PCI: LAD 20ost, 100p (3.0x23 Xience Sierra DES).  . CAD (coronary artery disease)    a. 01/2017 Ant STEMI/PCI: LM 20d, LAD 20ost, 100p (3.0x23 Xience Sierra DES), 103m, D1 50ost, LCX nl, RCA 30p.  . Chronic combined systolic (congestive) and diastolic (congestive) heart failure ([place])    a. 01/2018 Echo: EF 30-35%, sev antsept, ant, apical HK. Gr1 DD, mild MR.  . Essential hypertension   . Hyperlipidemia    a. Myalgias with high dose lipitor - tolerating 40mg  daily.  . Ischemic cardiomyopathy    a. 01/2018 Echo: EF 30-35%.  . Type II diabetes mellitus ([place])     Medications:  Scheduled:  . heparin  3,800 Units Intravenous Once   Infusions:  . heparin      Assessment: 62 yo M to start Heparin drip for ACS/STEMi. Plavix PTA per Med Rec Hgb 16.4  Plt 212  INR 1.09 aPTT 35  Goal of Therapy:  Heparin level 0.3-0.7 units/ml Monitor platelets by anticoagulation protocol: Yes   Plan:  Give 3800 units bolus x 1 Start heparin infusion at 750 units/hr Check anti-Xa level in 6 hours and daily while on heparin Continue to monitor H&H and platelets  Indie Boehne A 11/19/2017,6:32 PM

## 2017-11-19 NOTE — Progress Notes (Signed)
Family Meeting Note  Advance Directive:no  Today a meeting took place with the Patient.  Patient is able to participate.   The following clinical team members were present during this meeting:MD  The following were discussed:Patient's diagnosis: , Patient's progosis: Unable to determine and Goals for treatment: Continue present management  Additional follow-up to be provided: prn  Time spent during discussion:20 minutes  Katy D Mayo, MD  

## 2017-11-19 NOTE — Telephone Encounter (Signed)
Called patient using [employer] ID# [phone]. Patient verbalized understanding of results and to continue current medications which he says he is doing.  Patient could not come to appointment offered with next week but is able to come in on 12/01/17.

## 2017-11-20 ENCOUNTER — Encounter
Admission: EM | Disposition: A | Discharge status: Home / Self Care | Payer: Self-pay | Source: Home / Self Care | Attending: Internal Medicine

## 2017-11-20 ENCOUNTER — Encounter: Payer: Self-pay | Admitting: Emergency Medicine

## 2017-11-20 ENCOUNTER — Other Ambulatory Visit: Payer: Self-pay

## 2017-11-20 DIAGNOSIS — I200 Unstable angina: Secondary | ICD-10-CM

## 2017-11-20 DIAGNOSIS — I25110 Atherosclerotic heart disease of native coronary artery with unstable angina pectoris: Principal | ICD-10-CM

## 2017-11-20 HISTORY — PX: LEFT HEART CATH AND CORONARY ANGIOGRAPHY: CATH118249

## 2017-11-20 LAB — CBC
HCT: 41.7 % (ref 40.0–52.0)
Hemoglobin: 15 g/dL (ref 13.0–18.0)
MCH: 33.3 pg (ref 26.0–34.0)
MCHC: 35.9 g/dL (ref 32.0–36.0)
MCV: 92.7 fL (ref 80.0–100.0)
PLATELETS: 180 10*3/uL (ref 150–440)
RBC: 4.5 MIL/uL (ref 4.40–5.90)
RDW: 13.4 % (ref 11.5–14.5)
WBC: 5.7 10*3/uL (ref 3.8–10.6)

## 2017-11-20 LAB — GLUCOSE, CAPILLARY
GLUCOSE-CAPILLARY: 107 mg/dL — AB (ref 70–99)
Glucose-Capillary: 99 mg/dL (ref 70–99)
Glucose-Capillary: 138 mg/dL — ABNORMAL HIGH (ref 70–99)

## 2017-11-20 LAB — BASIC METABOLIC PANEL
Anion gap: 9 (ref 5–15)
BUN: 12 mg/dL (ref 6–20)
CHLORIDE: 105 mmol/L (ref 98–111)
CO2: 26 mmol/L (ref 22–32)
CREATININE: 0.67 mg/dL (ref 0.61–1.24)
Calcium: 8.8 mg/dL — ABNORMAL LOW (ref 8.9–10.3)
GFR calc Af Amer: >60 mL/min (ref >60)
GFR calc non Af Amer: >60 mL/min (ref >60)
GLUCOSE: 108 mg/dL — AB (ref 70–99)
Potassium: 3.3 mmol/L — ABNORMAL LOW (ref 3.5–5.1)
SODIUM: 140 mmol/L (ref 135–145)

## 2017-11-20 LAB — HEPARIN LEVEL (UNFRACTIONATED): Heparin Unfractionated: 0.37 IU/mL (ref 0.30–0.70)

## 2017-11-20 LAB — TROPONIN I
TROPONIN I: 0.03 ng/mL — AB (ref <0.03)
Troponin I: <0.03 ng/mL (ref <0.03)
Troponin I: <0.03 ng/mL (ref <0.03)

## 2017-11-20 LAB — MAGNESIUM: Magnesium: 2.4 mg/dL (ref 1.7–2.4)

## 2017-11-20 SURGERY — LEFT HEART CATH AND CORONARY ANGIOGRAPHY
Anesthesia: Moderate Sedation

## 2017-11-20 MED ORDER — HEPARIN SODIUM (PORCINE) 1000 UNIT/ML IJ SOLN
INTRAMUSCULAR | Status: AC
  Filled 2017-11-20: qty 1

## 2017-11-20 MED ORDER — SODIUM CHLORIDE 0.9% FLUSH: 3.0000 mL | INTRAVENOUS | Status: DC | PRN

## 2017-11-20 MED ORDER — FENTANYL CITRATE (PF) 100 MCG/2ML IJ SOLN
INTRAMUSCULAR | Status: AC
  Filled 2017-11-20: qty 2

## 2017-11-20 MED ORDER — FENTANYL CITRATE (PF) 100 MCG/2ML IJ SOLN
INTRAMUSCULAR | Status: DC | PRN
  Administered 2017-11-20: 25 ug via INTRAVENOUS

## 2017-11-20 MED ORDER — HEPARIN (PORCINE) IN NACL 1000-0.9 UT/500ML-% IV SOLN
INTRAVENOUS | Status: AC
  Filled 2017-11-20: qty 1000

## 2017-11-20 MED ORDER — HEPARIN SODIUM (PORCINE) 1000 UNIT/ML IJ SOLN
INTRAMUSCULAR | Status: DC | PRN
  Administered 2017-11-20: 3000 [IU] via INTRAVENOUS

## 2017-11-20 MED ORDER — SODIUM CHLORIDE 0.9 % IV SOLN
INTRAVENOUS | Status: DC
  Administered 2017-11-20: 14:00:00 via INTRAVENOUS

## 2017-11-20 MED ORDER — POTASSIUM CHLORIDE CRYS ER 20 MEQ PO TBCR
20.0000 meq | EXTENDED_RELEASE_TABLET | Freq: Once | ORAL | Status: AC
  Administered 2017-11-20: 20 meq via ORAL
  Filled 2017-11-20: qty 1

## 2017-11-20 MED ORDER — SODIUM CHLORIDE 0.9% FLUSH: 3.0000 mL | Freq: Two times a day (BID) | INTRAVENOUS | Status: DC

## 2017-11-20 MED ORDER — SODIUM CHLORIDE 0.9 % IV SOLN
INTRAVENOUS | Status: DC
  Administered 2017-11-20: 09:00:00 via INTRAVENOUS

## 2017-11-20 MED ORDER — SODIUM CHLORIDE 0.9 % IV SOLN: 250.0000 mL | INTRAVENOUS | Status: DC | PRN

## 2017-11-20 MED ORDER — MIDAZOLAM HCL 2 MG/2ML IJ SOLN
INTRAMUSCULAR | Status: DC | PRN
  Administered 2017-11-20: 1 mg via INTRAVENOUS

## 2017-11-20 MED ORDER — MIDAZOLAM HCL 2 MG/2ML IJ SOLN
INTRAMUSCULAR | Status: AC
  Filled 2017-11-20: qty 2

## 2017-11-20 MED ORDER — VERAPAMIL HCL 2.5 MG/ML IV SOLN
INTRAVENOUS | Status: AC
  Filled 2017-11-20: qty 2

## 2017-11-20 MED ORDER — NITROGLYCERIN IN D5W 200-5 MCG/ML-% IV SOLN: 0.0000 ug/min | INTRAVENOUS | Status: DC

## 2017-11-20 MED ORDER — HEPARIN (PORCINE) IN NACL 100-0.45 UNIT/ML-% IJ SOLN: 750.0000 [IU]/h | INTRAMUSCULAR | Status: DC

## 2017-11-20 MED ORDER — PNEUMOCOCCAL VAC POLYVALENT 25 MCG/0.5ML IJ INJ: 0.5000 mL | INJECTION | INTRAMUSCULAR | Status: DC

## 2017-11-20 MED ORDER — IOPAMIDOL (ISOVUE-300) INJECTION 61%
INTRAVENOUS | Status: DC | PRN
  Administered 2017-11-20: 55 mL via INTRA_ARTERIAL

## 2017-11-20 MED ORDER — VERAPAMIL HCL 2.5 MG/ML IV SOLN
INTRAVENOUS | Status: DC | PRN
  Administered 2017-11-20: 2.5 mg via INTRA_ARTERIAL

## 2017-11-20 SURGICAL SUPPLY — 7 items
CATH INFINITI 5FR JK (CATHETERS) ×3 IMPLANT
DEVICE RAD TR BAND REGULAR (VASCULAR PRODUCTS) ×3 IMPLANT
GLIDESHEATH SLEND SS 6F .021 (SHEATH) ×3 IMPLANT
KIT MANI 3VAL PERCEP (MISCELLANEOUS) ×3 IMPLANT
PACK CARDIAC CATH (CUSTOM PROCEDURE TRAY) ×3 IMPLANT
WIRE HITORQ VERSACORE ST 145CM (WIRE) ×3 IMPLANT
WIRE ROSEN-J .035X260CM (WIRE) ×3 IMPLANT

## 2017-11-20 NOTE — Plan of Care (Signed)
Problem: Education: Goal: Knowledge of General Education information will improve Description Including pain rating scale, medication(s)/side effects and non-pharmacologic comfort measures 11/20/2017 1729 by Chriss Czar, Illene Bolus, RN Outcome: Adequate for Discharge 11/20/2017 1546 by Chriss Czar, Illene Bolus, RN Outcome: Adequate for Discharge   Problem: Health Behavior/Discharge Planning: Goal: Ability to manage health-related needs will improve 11/20/2017 1729 by Chriss Czar, Illene Bolus, RN Outcome: Adequate for Discharge 11/20/2017 1546 by Chriss Czar, Illene Bolus, RN Outcome: Adequate for Discharge   Problem: Clinical Measurements: Goal: Ability to maintain clinical measurements within normal limits will improve 11/20/2017 1729 by Feliberto Gottron, RN Outcome: Adequate for Discharge 11/20/2017 1546 by Chriss Czar, Illene Bolus, RN Outcome: Adequate for Discharge Goal: Will remain free from infection 11/20/2017 1729 by Feliberto Gottron, RN Outcome: Adequate for Discharge 11/20/2017 1546 by Chriss Czar, Illene Bolus, RN Outcome: Adequate for Discharge Goal: Diagnostic test results will improve 11/20/2017 1729 by Feliberto Gottron, RN Outcome: Adequate for Discharge 11/20/2017 1546 by Chriss Czar, Illene Bolus, RN Outcome: Adequate for Discharge Goal: Respiratory complications will improve 11/20/2017 1729 by Feliberto Gottron, RN Outcome: Adequate for Discharge 11/20/2017 1546 by Chriss Czar, Illene Bolus, RN Outcome: Adequate for Discharge Goal: Cardiovascular complication will be avoided 11/20/2017 1729 by Feliberto Gottron, RN Outcome: Adequate for Discharge 11/20/2017 1546 by Feliberto Gottron, RN Outcome: Adequate for Discharge   Problem: Activity: Goal: Risk for activity intolerance will decrease 11/20/2017 1729 by Chriss Czar, Illene Bolus, RN Outcome: Adequate for Discharge 11/20/2017 1546 by Chriss Czar, Illene Bolus, RN Outcome:  Adequate for Discharge   Problem: Nutrition: Goal: Adequate nutrition will be maintained 11/20/2017 1729 by Chriss Czar, Illene Bolus, RN Outcome: Adequate for Discharge 11/20/2017 1546 by Chriss Czar, Illene Bolus, RN Outcome: Adequate for Discharge   Problem: Coping: Goal: Level of anxiety will decrease 11/20/2017 1729 by Chriss Czar, Illene Bolus, RN Outcome: Adequate for Discharge 11/20/2017 1546 by Chriss Czar, Illene Bolus, RN Outcome: Adequate for Discharge   Problem: Elimination: Goal: Will not experience complications related to bowel motility 11/20/2017 1729 by Chriss Czar, Illene Bolus, RN Outcome: Adequate for Discharge 11/20/2017 1546 by Chriss Czar, Illene Bolus, RN Outcome: Adequate for Discharge Goal: Will not experience complications related to urinary retention 11/20/2017 1729 by Feliberto Gottron, RN Outcome: Adequate for Discharge 11/20/2017 1546 by Chriss Czar, Illene Bolus, RN Outcome: Adequate for Discharge   Problem: Pain Managment: Goal: General experience of comfort will improve 11/20/2017 1729 by Chriss Czar, Illene Bolus, RN Outcome: Adequate for Discharge 11/20/2017 1546 by Chriss Czar, Illene Bolus, RN Outcome: Adequate for Discharge   Problem: Safety: Goal: Ability to remain free from injury will improve 11/20/2017 1729 by Chriss Czar, Illene Bolus, RN Outcome: Adequate for Discharge 11/20/2017 1546 by Chriss Czar, Illene Bolus, RN Outcome: Adequate for Discharge   Problem: Skin Integrity: Goal: Risk for impaired skin integrity will decrease 11/20/2017 1729 by Chriss Czar, Illene Bolus, RN Outcome: Adequate for Discharge 11/20/2017 1546 by Chriss Czar, Illene Bolus, RN Outcome: Adequate for Discharge   Problem: Education: Goal: Understanding of CV disease, CV risk reduction, and recovery process will improve 11/20/2017 1729 by Feliberto Gottron, RN Outcome: Adequate for Discharge 11/20/2017 1546 by Feliberto Gottron, RN Outcome: Adequate  for Discharge Goal: Individualized Educational Video(s) 11/20/2017 1729 by Feliberto Gottron, RN Outcome: Adequate for Discharge 11/20/2017 1546 by Feliberto Gottron, RN Outcome: Adequate for Discharge   Problem: Activity: Goal: Ability to return to baseline activity level will improve 11/20/2017 1729 by Feliberto Gottron, RN Outcome: Adequate for Discharge 11/20/2017 1546 by Feliberto Gottron, RN Outcome: Adequate for Discharge  Problem: Cardiovascular: Goal: Ability to achieve and maintain adequate cardiovascular perfusion will improve 11/20/2017 1729 by Chriss Czar, Illene Bolus, RN Outcome: Adequate for Discharge 11/20/2017 1546 by Chriss Czar, Illene Bolus, RN Outcome: Adequate for Discharge Goal: Vascular access site(s) Level 0-1 will be maintained 11/20/2017 1729 by Chriss Czar, Illene Bolus, RN Outcome: Adequate for Discharge 11/20/2017 1546 by Chriss Czar, Illene Bolus, RN Outcome: Adequate for Discharge   Problem: Health Behavior/Discharge Planning: Goal: Ability to safely manage health-related needs after discharge will improve 11/20/2017 1729 by Feliberto Gottron, RN Outcome: Adequate for Discharge 11/20/2017 1546 by Feliberto Gottron, RN Outcome: Adequate for Discharge

## 2017-11-20 NOTE — Progress Notes (Signed)
ANTICOAGULATION CONSULT NOTE - Initial Consult  Pharmacy Consult for Heparin Drip Indication: chest pain/ACS  No Known Allergies  Patient Measurements: Height: 5' (152.4 cm) Weight: 140 lb (63.5 kg) IBW/kg (Calculated) : 50 Heparin Dosing Weight: 62.8 kg  Vital Signs: Temp: 98.3 F (36.8 C) (09/19 2320) Temp Source: Oral (09/19 2320) BP: 96/70 (09/20 0345) Pulse Rate: 45 (09/20 0345)  Labs: Recent Labs    11/19/17 1705 11/19/17 1835 11/19/17 2336 11/20/17 0330  HGB 16.4  --   --  15.0  HCT 45.5  --   --  41.7  PLT 212  --   --  180  APTT  --  35  --   --   LABPROT  --  14.0  --   --   INR  --  1.09  --   --   HEPARINUNFRC  --   --   --  0.37  CREATININE 0.91  --   --  0.67  TROPONINI <0.03  --  <0.03 <0.03    Estimated Creatinine Clearance: 76.9 mL/min (by C-G formula based on SCr of 0.67 mg/dL).   Medical History: Past Medical History:  Diagnosis Date  . Acute ST elevation myocardial infarction (STEMI) involving left anterior descending (LAD) coronary artery ([place])    a. 01/2017 Ant STEMI/PCI: LAD 20ost, 100p (3.0x23 Xience Sierra DES).  . CAD (coronary artery disease)    a. 01/2017 Ant STEMI/PCI: LM 20d, LAD 20ost, 100p (3.0x23 Xience Sierra DES), 62m, D1 50ost, LCX nl, RCA 30p.  . Chronic combined systolic (congestive) and diastolic (congestive) heart failure ([place])    a. 01/2018 Echo: EF 30-35%, sev antsept, ant, apical HK. Gr1 DD, mild MR.  . Essential hypertension   . Hyperlipidemia    a. Myalgias with high dose lipitor - tolerating 40mg  daily.  . Ischemic cardiomyopathy    a. 01/2018 Echo: EF 30-35%.  . Type II diabetes mellitus ([place])     Medications:  Scheduled:  . aspirin  81 mg Oral Daily  . atorvastatin  40 mg Oral Daily  . carvedilol  6.25 mg Oral BID  . clopidogrel  75 mg Oral Daily  . furosemide  20 mg Oral Daily  . Influenza vac split quadrivalent PF  0.5 mL Intramuscular Tomorrow-1000  . insulin aspart  0-5 Units Subcutaneous QHS  .  insulin aspart  0-9 Units Subcutaneous TID WC  . [START ON 11/21/2017] pneumococcal 23 valent vaccine  0.5 mL Intramuscular Tomorrow-1000  . sacubitril-valsartan  1 tablet Oral BID  . spironolactone  12.5 mg Oral Daily   Infusions:  . heparin 750 Units/hr (11/20/17 0101)  . nitroGLYCERIN 5 mcg/min (11/20/17 0457)    Assessment: 62 yo [name] to start Heparin drip for ACS/STEMi. Plavix PTA per Med Rec Hgb 16.4  Plt 212  INR 1.09 aPTT 35  Goal of Therapy:  Heparin level 0.3-0.7 units/ml Monitor platelets by anticoagulation protocol: Yes   Plan:  Give 3800 units bolus x 1 Start heparin infusion at 750 units/hr Check anti-Xa level in 6 hours and daily while on heparin Continue to monitor H&H and platelets   9/20 0330 heparin level 0.37. Continue current regimen. Recheck in 6 hours to confirm.  Shayne Diguglielmo S 11/20/2017,5:28 AM

## 2017-11-20 NOTE — Plan of Care (Signed)

## 2017-11-20 NOTE — Consult Note (Signed)
Cardiology Consultation:   Patient ID: Lucas [name]; [id number]; 02/04/1956   Admit date: 11/19/2017 Date of Consult: 11/20/2017  Primary Care Provider: Patient, [name] Primary Cardiologist: End   Patient Profile:   Lucas [name] is a 61 y.o. male with a hx of CAD status post anterior STEMI with thrombotic occlusion of the LAD status post PCI/DES to the LAD in 01/2017, HFrEF secondary to ischemic cardiomyopathy with an EF of 71%, chronic diastolic CHF, DM2, HTN, and HLD who is being seen today for the evaluation of chest pain at the request of Dr. Brett Albino.  History of Present Illness:   [name] was admitted to the hospital in 01/2017 with a late presenting anterior ST segment elevation MI.  Troponin peaked at >65.  LHC at that time showed acute plaque rupture and 100% thrombotic occlusion of the proximal LAD.  He underwent successful PCI/DES to the LAD.  LHC otherwise showed mild to moderate, nonobstructive disease involving the mid/distal LAD and RCA.  TTE at that time showed an EF of 30-35%, severe hypokinesis of the anteroseptal, anterior, and apical myocardium, grade 1 diastolic dysfunction, mild MR. He was discharged on evidence-based medications including Coreg, lisinopril, and spironolactone.  He was also discharged with a LifeVest.  However, after wearing this for only 2 days, he was unable to tolerate it secondary to a constricted feeling in his chest with accompanying anxiety.  Patient chose to self discontinued the LifeVest.  He did not receive any shocks while wearing the vest.  At his office visit in 04/2017 the patient reported a cough with lisinopril and was transitioned to losartan.  He was also felt to be mildly volume overloaded at that time and placed on low-dose Lasix. Follow-up limited echo on 05/29/2017 showed an improvement in his EF to 40 to 45%, hypokinesis of the anterior, anteroseptal, and apical myocardium, grade 1 diastolic dysfunction, mild MR,  normal size LA, RVSF normal, PASP normal. He was recently seen in the office in late 10/2017 with vague chest discomfort that was different than what he experienced with his STEMI. He underwent ETT on 11/06/17 that was intermediate risk with mild ST depression during stress with neck/jaw pain concerning for anginal equivalent. Optimization of medications was advised along with close follow up.   Patient comes in with worsening chest pain for the past 3-4 days. Initially, the pain was with exertion and improved with rest. However, over the past 3-4 days the pain has been occurring at rest as well. There is associated SOB, diaphoresis, headache and profound weakness. These symptoms feel similar to how he felt with his MI. Pain radiates up to his neck and left-side jaw. Pain is pressure like and at its worst is rated a 9/10. He has been compliant with all medications, including DAPT.   Upon the patient's arrival to [facility] they were found to have BP in the 062I systolic though did drop into the 94W systolic overnight with nitro gtt, HR has remained bradycardic, oxygen saturation 100% on room air, weight 63.5 kg. EKG as below, CXR showed no acute process. Labs showed troponin negative x 4, CBC unremarkable x 2, K+ 4.0-->3.3, SCr 0.91. He was given ASA 324 mg x 1 and started on a heparin and nitro gtt. He became hypotensive with the nitro gtt as above. He was also continued on Coreg, Entresto and spironolactone. Currently, notes 3/10 chest pain.   Past Medical History:  Diagnosis Date  . Acute ST elevation myocardial infarction (STEMI)  involving left anterior descending (LAD) coronary artery ([place])    a. 01/2017 Ant STEMI/PCI: LAD 20ost, 100p (3.0x23 Xience Sierra DES).  . CAD (coronary artery disease)    a. 01/2017 Ant STEMI/PCI: LM 20d, LAD 20ost, 100p (3.0x23 Xience Sierra DES), 52m, D1 50ost, LCX nl, RCA 30p.  . Chronic combined systolic (congestive) and diastolic (congestive) heart failure ([place])    a. 01/2018  Echo: EF 30-35%, sev antsept, ant, apical HK. Gr1 DD, mild MR.  . Essential hypertension   . Hyperlipidemia    a. Myalgias with high dose lipitor - tolerating 40mg  daily.  . Ischemic cardiomyopathy    a. 01/2018 Echo: EF 30-35%.  . Type II diabetes mellitus ([place])     Past Surgical History:  Procedure Laterality Date  . APPENDECTOMY    . CARDIAC CATHETERIZATION    . CORONARY ANGIOPLASTY    . CORONARY/GRAFT ACUTE MI REVASCULARIZATION N/A 01/25/2017   Procedure: Coronary/Graft Acute MI Revascularization;  Surgeon: Nelva Bush, MD;  Location: [place] [facility];  Service: Cardiovascular;  Laterality: N/A;  . LEFT HEART CATH AND CORONARY ANGIOGRAPHY N/A 01/25/2017   Procedure: LEFT HEART CATH AND CORONARY ANGIOGRAPHY;  Surgeon: Nelva Bush, MD;  Location: [place] [facility];  Service: Cardiovascular;  Laterality: N/A;     Home Meds: Prior to Admission medications   Medication Sig Start Date End Date Taking? Authorizing Provider  aspirin 81 MG chewable tablet CHEW ONE TABLET BY MOUTH EVERY DAY Patient taking differently: Chew 81 mg by mouth daily.  08/11/17  Yes [name], MD  atorvastatin (LIPITOR) 80 MG tablet Take 0.5 tablets (40 mg total) by mouth daily. 03/10/17  Yes Hubbard Hartshorn, FNP  carvedilol (COREG) 6.25 MG tablet Take 1 tablet (6.25 mg total) by mouth 2 (two) times daily. 10/28/17  Yes End, Harrell Gave, MD  clopidogrel (PLAVIX) 75 MG tablet Take 1 tablet (75 mg total) by mouth daily. Begin the day after taking 300mg  Clopidogrel. 09/14/17  Yes Doles-Johnson, Teah, NP  metFORMIN (GLUCOPHAGE) 500 MG tablet TAKE ONE TABLET BY MOUTH EVERY DAY WITH BREAKFAST Patient taking differently: Take 500 mg by mouth daily with breakfast.  09/28/17  Yes Chaplin, Dianah Field, MD  nitroGLYCERIN (NITROSTAT) 0.4 MG SL tablet Place 1 tablet (0.4 mg total) under the tongue every 5 (five) minutes as needed for chest pain. Maximum of 3 doses. 10/28/17 01/26/18 Yes End, Harrell Gave, MD  potassium  chloride (K-DUR,KLOR-CON) 10 MEQ tablet Take 1 tablet (10 mEq total) by mouth daily. 06/17/17  Yes Hubbard Hartshorn, FNP  sacubitril-valsartan (ENTRESTO) 24-26 MG Take 1 tablet by mouth 2 (two) times daily. 06/16/17  Yes Darylene Price A, FNP  spironolactone (ALDACTONE) 25 MG tablet Take 0.5 tablets (12.5 mg total) by mouth daily. 03/11/17  Yes Hubbard Hartshorn, FNP  furosemide (LASIX) 20 MG tablet Take 1 tablet (20 mg total) by mouth daily. 04/29/17 10/28/17  End, Harrell Gave, MD    Inpatient Medications: Scheduled Meds: . aspirin  81 mg Oral Daily  . atorvastatin  40 mg Oral Daily  . carvedilol  6.25 mg Oral BID  . clopidogrel  75 mg Oral Daily  . furosemide  20 mg Oral Daily  . Influenza vac split quadrivalent PF  0.5 mL Intramuscular Tomorrow-1000  . insulin aspart  0-5 Units Subcutaneous QHS  . insulin aspart  0-9 Units Subcutaneous TID WC  . [START ON 11/21/2017] pneumococcal 23 valent vaccine  0.5 mL Intramuscular Tomorrow-1000  . sacubitril-valsartan  1 tablet Oral BID  .  spironolactone  12.5 mg Oral Daily   Continuous Infusions: . heparin 750 Units/hr (11/20/17 0101)  . nitroGLYCERIN 5 mcg/min (11/20/17 0457)   PRN Meds: acetaminophen, ondansetron (ZOFRAN) IV  Allergies:  No Known Allergies  Social History:   Social History   Socioeconomic History  . Marital status: Married    Spouse name: Not on file  . Number of children: Not on file  . Years of education: Not on file  . Highest education level: Not on file  Occupational History  . Not on file  Social Needs  . Financial resource strain: Hard  . Food insecurity:    Worry: Never true    Inability: Never true  . Transportation needs:    Medical: Not on file    Non-medical: Not on file  Tobacco Use  . Smoking status: Never Smoker  . Smokeless tobacco: Never Used  Substance and Sexual Activity  . Alcohol use: No    Frequency: Never  . Drug use: No  . Sexual activity: Yes    Birth control/protection: Coitus  interruptus, None  Lifestyle  . Physical activity:    Days per week: 7 days    Minutes per session: 30 min  . Stress: Patient refused  Relationships  . Social connections:    Talks on phone: Patient refused    Gets together: Patient refused    Attends religious service: Patient refused    Active member of club or organization: Patient refused    Attends meetings of clubs or organizations: Patient refused    Relationship status: Patient refused  . Intimate partner violence:    Fear of current or ex partner: Not on file    Emotionally abused: Not on file    Physically abused: Not on file    Forced sexual activity: Not on file  Other Topics Concern  . Not on file  Social History Narrative  . Not on file     Family History:   Family History  Problem Relation Age of Onset  . Heart disease Father        s/p pacemaker  . Arrhythmia Father   . Asthma Father   . Asthma Mother     ROS:  Review of Systems  Constitutional: Positive for diaphoresis and malaise/fatigue. Negative for chills, fever and weight loss.  HENT: Negative for congestion.   Eyes: Negative for discharge and redness.  Respiratory: Positive for shortness of breath. Negative for cough, hemoptysis, sputum production and wheezing.   Cardiovascular: Positive for chest pain. Negative for palpitations, orthopnea, claudication, leg swelling and PND.  Gastrointestinal: Negative for abdominal pain, blood in stool, heartburn, melena, nausea and vomiting.  Genitourinary: Negative for hematuria.  Musculoskeletal: Negative for falls and myalgias.  Skin: Negative for rash.  Neurological: Positive for weakness and headaches. Negative for dizziness, tingling, tremors, sensory change, speech change, focal weakness and loss of consciousness.  Endo/Heme/Allergies: Does not bruise/bleed easily.  Psychiatric/Behavioral: Negative for substance abuse. The patient is not nervous/anxious.   All other systems reviewed and are negative.      Physical Exam/Data:   Vitals:   11/20/17 0230 11/20/17 0245 11/20/17 0300 11/20/17 0345  BP: (!) 79/48 (!) 84/54 100/66 96/70  Pulse: (!) 44 (!) 47 (!) 46 (!) 45  Resp:      Temp:      TempSrc:      SpO2:      Weight:      Height:        Intake/Output Summary (Last  24 hours) at 11/20/2017 0717 Last data filed at 11/20/2017 0400 Gross per 24 hour  Intake 16.78 ml  Output -  Net 16.78 ml   Filed Weights   11/19/17 1702  Weight: 63.5 kg   Body mass index is 27.34 kg/m.   Physical Exam: General: Well developed, well nourished, in no acute distress. Head: Normocephalic, atraumatic, sclera non-icteric, no xanthomas, nares without discharge.  Neck: Negative for carotid bruits. JVD not elevated. Lungs: Clear bilaterally to auscultation without wheezes, rales, or rhonchi. Breathing is unlabored. Heart: RRR with S1 S2. No murmurs, rubs, or gallops appreciated. Abdomen: Soft, non-tender, non-distended with normoactive bowel sounds. No hepatomegaly. No rebound/guarding. No obvious abdominal masses. Msk:  Strength and tone appear normal for age. Extremities: No clubbing or cyanosis. No edema. Distal pedal pulses are 2+ and equal bilaterally. Neuro: Alert and oriented X 3. No facial asymmetry. No focal deficit. Moves all extremities spontaneously. Psych:  Responds to questions appropriately with a normal affect.   EKG:  The EKG was personally reviewed and demonstrates: sinus bradycardia, 56 bpm, poor R wave progression, anterolateral TWI Telemetry:  Telemetry was personally reviewed and demonstrates: sinus bradycardia to sinus rhythm with heart rates in the upper 40s to low 60s bpm  Weights: Filed Weights   11/19/17 1702  Weight: 63.5 kg    Relevant CV Studies:  LHC 01/2017: Conclusions: 1. Late-presenting anterior STEMI with acute plaque rupture and 100% thrombotic occlusion of the proximal LAD (type 1 MI). 2. Mild to moderate, non-obstructive disease involving the  mid/distal LAD and RCA. 3. Moderately to severely reduced left ventricular contraction. 4. Moderately elevated left ventricular filling pressure.  Recommendations: 1. Admit to ICU; patient will need to remain hospitalized at least 48 hours, given large anterior MI. 2. Dual antiplatelet therapy with aspirin and ticagrelor for at least 12 months, ideally longer. 3. Aggressive secondary prevention, including high-intensity statin therapy. 4. Obtain transthoracic echocardiogram to better assess LVEF. If LVEF < 35%, I recommend LifeVest on discharge given large anterior MI. 5. Gentle diuresis, given elevated LVEDP. 6. Initiate carvedilol 3.125 mg BID with uptitration as tolerated. ACEI/ARB +/- aldosterone antagonist should be added prior to discharge based on renal function. __________  2-D Echo 05/2017: Study Conclusions  - Left ventricle: The cavity size was normal. Systolic function was   mildly to moderately reduced. The estimated ejection fraction was   in the range of 40% to 45%. Hypokinesis of the anterior   myocardium. Hypokinesis of the anteroseptal myocardium.   Hypokinesis of the apical myocardium. Doppler parameters are   consistent with abnormal left ventricular relaxation (grade 1   diastolic dysfunction). - Mitral valve: There was mild regurgitation. - Left atrium: The atrium was normal in size. - Right ventricle: Systolic function was normal. - Pulmonary arteries: Systolic pressure was within the normal   range. __________  ETT 11/06/2017: Study Highlights     Baseline EKG demonstrates normal sinus rhythm with anteroseptal Q waves and non-specific T wave abnormality.  Patient demonstrates good exercise capacity with normal heart rate and blood pressure responses. Neck and jaw discomfort noted during peak stress and into early recovery.  Horizontal ST segment depression ST segment depression of 1 mm was noted during stress in the II, III and aVF leads.  No  significant arrhythmia was observed.  Intermediate risk study ([facility] Treadmill Score = 0).   Abnormal, intermediate risk exercise tolerance test with mild ST depression during stress and neck/jaw pain, concerning for anginal equivalent.  __________  Laboratory Data:  Chemistry Recent Labs  Lab 11/19/17 1705 11/20/17 0330  NA 140 140  K 4.0 3.3*  CL 103 105  CO2 27 26  GLUCOSE 111* 108*  BUN 14 12  CREATININE 0.91 0.67  CALCIUM 9.3 8.8*  GFRNONAA >60 >60  GFRAA >60 >60  ANIONGAP 10 9    No results for input(s): PROT, ALBUMIN, AST, ALT, ALKPHOS, BILITOT in the last 168 hours. Hematology Recent Labs  Lab 11/19/17 1705 11/20/17 0330  WBC 6.5 5.7  RBC 4.95 4.50  HGB 16.4 15.0  HCT 45.5 41.7  MCV 91.9 92.7  MCH 33.0 33.3  MCHC 35.9 35.9  RDW 13.1 13.4  PLT 212 180   Cardiac Enzymes Recent Labs  Lab 11/19/17 1705 11/19/17 2336 11/20/17 0330  TROPONINI <0.03 <0.03 <0.03   No results for input(s): TROPIPOC in the last 168 hours.  BNPNo results for input(s): BNP, PROBNP in the last 168 hours.  DDimer No results for input(s): DDIMER in the last 168 hours.  Radiology/Studies:  Dg Chest 2 View  Result Date: 11/19/2017 IMPRESSION: Clear lungs. Electronically Signed   By: Ulyses Jarred M.D.   On: 11/19/2017 17:16    Assessment and Plan:   1. CAD involving the native coronary arteries with unstable angina: -Currently, notes 3/10 chest pain -Has ruled out -Recent ETT concerning for ischemia  -NPO -Schedule LHC this morning with Dr. Fletcher Anon -Continue DAPT with ASA 81 mg daily and Plavix 75 mg daily -Coreg and lipitor -Stop nitro gtt given headache  -Aggressive risk factor modification and secondary prevention   -Risks and benefits of cardiac catheterization have been discussed with the patient including risks of bleeding, bruising, infection, kidney damage, stroke, heart attack, and death. The patient understands these risks and is willing to proceed with the  procedure. All questions have been answered and concerns listened to  2. HFrEF secondary to ICM: -He does not appear grossly volume up -Weight at admission is down 3 kg from his last office weight  -Continue Coreg, bradycardia precludes titration, along with Entresto and spironolactone   3. HTN: -BP soft overnight in the setting of nitro gtt -Improved with tapering of nitro gtt -Continue Coreg, Entresto and spironolactone   4. HLD: -Recent LDL of 29 from 05/2017 with normal LFT at that time -Continue Lipitor 40 mg daily  5. Hypokalemia: -Replete to goal > 4.0 -Check magnesium with recommendation to replete to goal > 2.0 as needed  6. DM2: -A1c pending -Per IM  7. Language barrier: [facility] supplied medical interpreter was used for this consult    For questions or updates, please contact [place] Please consult [email or web address] for contact info under Cardiology/STEMI.   Signed, Christell Faith, PA-C [place] Pager: [phone] 11/20/2017, 7:17 AM

## 2017-11-20 NOTE — Progress Notes (Signed)
Patient given discharge instructions with son at bedside. IV's taken out and tele monitor off. Patient verbalized understanding with no further questions. Patient going home via family vehicle in stable condition.

## 2017-11-20 NOTE — Discharge Summary (Signed)
[facility] NAME: Lucas [name]    MR#:  [id number]  DATE OF BIRTH:  09-29-1955  DATE OF ADMISSION:  11/19/2017 ADMITTING PHYSICIAN: Sela Hua, MD  DATE OF DISCHARGE: 11/20/2017  PRIMARY CARE PHYSICIAN: Patient, [name]    ADMISSION DIAGNOSIS:  Unstable angina ([place]) [I20.0]  DISCHARGE DIAGNOSIS:  unstable angina-- without MI  SECONDARY DIAGNOSIS:   Past Medical History:  Diagnosis Date  . Acute ST elevation myocardial infarction (STEMI) involving left anterior descending (LAD) coronary artery ([place])    a. 01/2017 Ant STEMI/PCI: LAD 20ost, 100p (3.0x23 Xience Sierra DES).  . CAD (coronary artery disease)    a. 01/2017 Ant STEMI/PCI: LM 20d, LAD 20ost, 100p (3.0x23 Xience Sierra DES), 13m, D1 50ost, LCX nl, RCA 30p.  . Chronic combined systolic (congestive) and diastolic (congestive) heart failure ([place])    a. 01/2018 Echo: EF 30-35%, sev antsept, ant, apical HK. Gr1 DD, mild MR.  . Essential hypertension   . Hyperlipidemia    a. Myalgias with high dose lipitor - tolerating 40mg  daily.  . Ischemic cardiomyopathy    a. 01/2018 Echo: EF 30-35%.  . Type II diabetes mellitus [facility])     HOSPITAL COURSE:  Lucas [name]  is a 62 y.o. male with a known history of CAD s/p PCI to LAD, ischemic cardiomyopathy with chronic systolic heart failure (EF 30-35%), hypertension, T2DM, and hyperlipidemia who presented to the ED with chest pain for the last 3 days. The chest pain is located in the center of his chest. The pain feels "tight  Unstable angina- concern for ACS given his history and recent abnormal stress test on 11/06/17. Has a history of STEMI with 100% thrombotic occlusion of the proximal LAD s/p PCI/DES. Initial EKG without any ischemic changes and initial troponin negative. - cardiology consult appreciated.  -Status post cardiac cath by Dr. Fletcher Anon  Previously placed Prox LAD drug eluting stent is widely  patent.  Balloon angioplasty was performed.  Ost 1st Diag lesion is 20% stenosed.  Prox RCA lesion is 30% stenosed.  There is mild left ventricular systolic dysfunction.  LV end diastolic pressure is mildly elevated. The left ventricular ejection fraction is 45-50% by visual estimate.---recommends cont medical mnx - d/c heparin and nitro drips - continue aspirin, plavix, and coreg  Chronic systolic heart failure- stable, no signs of volume overload - continue home lasix, coreg, spironolactone, and entresto  Hypertension- normotensive in the ED. - continue coreg  Type 2 diabetes- mildly hyperglycemic in the ED - sensitive SSI -se will resume his metformin from Monday  Hyperlipidemia- stable - continue lipitor  hemodynamically stable. Denies any chest pain. Were discharged to home with outpatient follow-up with cardiology CONSULTS OBTAINED:  Treatment Team:  Thompson Grayer, MD  DRUG ALLERGIES:  No Known Allergies  DISCHARGE MEDICATIONS:   Allergies as of 11/20/2017   No Known Allergies     Medication List    STOP taking these medications   furosemide 20 MG tablet Commonly known as:  LASIX     TAKE these medications   aspirin 81 MG chewable tablet CHEW ONE TABLET BY MOUTH EVERY DAY What changed:  See the new instructions.   atorvastatin 80 MG tablet Commonly known as:  LIPITOR Take 0.5 tablets (40 mg total) by mouth daily.   carvedilol 6.25 MG tablet Commonly known as:  COREG Take 1 tablet (6.25 mg total) by mouth 2 (two) times daily.   clopidogrel 75 MG  tablet Commonly known as:  PLAVIX Take 1 tablet (75 mg total) by mouth daily. Begin the day after taking 300mg  Clopidogrel.   metFORMIN 500 MG tablet Commonly known as:  GLUCOPHAGE TAKE ONE TABLET BY MOUTH EVERY DAY WITH BREAKFAST What changed:  See the new instructions. Notes to patient:  START FROM Monday SEPT 23rd   nitroGLYCERIN 0.4 MG SL tablet Commonly known as:  NITROSTAT Place 1 tablet  (0.4 mg total) under the tongue every 5 (five) minutes as needed for chest pain. Maximum of 3 doses.   potassium chloride 10 MEQ tablet Commonly known as:  K-DUR,KLOR-CON Take 1 tablet (10 mEq total) by mouth daily.   sacubitril-valsartan 24-26 MG Commonly known as:  ENTRESTO Take 1 tablet by mouth 2 (two) times daily.   spironolactone 25 MG tablet Commonly known as:  ALDACTONE Take 0.5 tablets (12.5 mg total) by mouth daily.       If you experience worsening of your admission symptoms, develop shortness of breath, life threatening emergency, suicidal or homicidal thoughts you must seek medical attention immediately by calling 911 or calling your MD immediately  if symptoms less severe.  You Must read complete instructions/literature along with all the possible adverse reactions/side effects for all the Medicines you take and that have been prescribed to you. Take any new Medicines after you have completely understood and accept all the possible adverse reactions/side effects.   Please note  You were cared for by a hospitalist during your hospital stay. If you have any questions about your discharge medications or the care you received while you were in the hospital after you are discharged, you can call the unit and asked to speak with the hospitalist on call if the hospitalist that took care of you is not available. Once you are discharged, your primary care physician will handle any further medical issues. Please note that NO REFILLS for any discharge medications will be authorized once you are discharged, as it is imperative that you return to your primary care physician (or establish a relationship with a primary care physician if you do not have one) for your aftercare needs so that they can reassess your need for medications and monitor your lab values. Today   SUBJECTIVE   No new complaints  VITAL SIGNS:  Blood pressure 115/78, pulse (!) 58, temperature 97.7 F (36.5 C),  temperature source Oral, resp. rate 18, height 5' (1.524 m), weight 63.5 kg, SpO2 97 %.  I/O:    Intake/Output Summary (Last 24 hours) at 11/20/2017 1517 Last data filed at 11/20/2017 0849 Gross per 24 hour  Intake 26.44 ml  Output -  Net 26.44 ml    PHYSICAL EXAMINATION:  GENERAL:  62 y.o.-year-old patient lying in the bed with no acute distress.  EYES: Pupils equal, round, reactive to light and accommodation. No scleral icterus. Extraocular muscles intact.  HEENT: Head atraumatic, normocephalic. Oropharynx and nasopharynx clear.  NECK:  Supple, no jugular venous distention. No thyroid enlargement, no tenderness.  LUNGS: Normal breath sounds bilaterally, no wheezing, rales,rhonchi or crepitation. No use of accessory muscles of respiration.  CARDIOVASCULAR: S1, S2 normal. No murmurs, rubs, or gallops.  ABDOMEN: Soft, non-tender, non-distended. Bowel sounds present. No organomegaly or mass.  EXTREMITIES: No pedal edema, cyanosis, or clubbing.  NEUROLOGIC: Cranial nerves II through XII are intact. Muscle strength 5/5 in all extremities. Sensation intact. Gait not checked.  PSYCHIATRIC: The patient is alert and oriented x 3.  SKIN: No obvious rash, lesion, or ulcer.  DATA REVIEW:   CBC  Recent Labs  Lab 11/20/17 0330  WBC 5.7  HGB 15.0  HCT 41.7  PLT 180    Chemistries  Recent Labs  Lab 11/20/17 0330 11/20/17 1427  NA 140  --   K 3.3*  --   CL 105  --   CO2 26  --   GLUCOSE 108*  --   BUN 12  --   CREATININE 0.67  --   CALCIUM 8.8*  --   MG  --  2.4    Microbiology Results   No results found for this or any previous visit (from the past 240 hour(s)).  RADIOLOGY:  Dg Chest 2 View  Result Date: 11/19/2017 CLINICAL DATA:  Chest pain EXAM: CHEST - 2 VIEW COMPARISON:  01/25/2017 FINDINGS: The heart size and mediastinal contours are within normal limits. Both lungs are clear. The visualized skeletal structures are unremarkable. IMPRESSION: Clear lungs.  Electronically Signed   By: Ulyses Jarred M.D.   On: 11/19/2017 17:16     Management plans discussed with the patient, family and they are in agreement.  CODE STATUS:     Code Status Orders  (From admission, onward)         Start     Ordered   11/19/17 2109  Full code  Continuous     11/19/17 2108        Code Status History    Date Active Date Inactive Code Status Order ID Comments User Context   01/25/2017 2104 01/28/2017 2053 Full Code [id number]  End, Harrell Gave, MD Inpatient      TOTAL TIME TAKING CARE OF THIS PATIENT: *40* minutes.    Fritzi Mandes M.D on 11/20/2017 at 3:17 PM  Between 7am to 6pm - Pager - [phone] After 6pm go to [email or web address] - password [email or web address] [facility]  Office  [phone]  CC: Primary care physician; Patient, [name]

## 2017-11-20 NOTE — Care Management Note (Signed)
Case Management Note  Patient Details  Name: Lucas [name] MRN: [id number] Date of Birth: 1955/04/09  Subjective/Objective:        Patient admitted with CP.  Chronic CHF.  Discharging today.  Has access to a scale at home.   Uninsured and established with [facility].  Chronic Entresto.  Has all of his prescriptions.   Discontinuing po Lasix.  No transportation issues.  Has family support.         Action/Plan:   Expected Discharge Date:  11/20/17               Expected Discharge Plan:  Home/Self Care  In-House Referral:     Discharge planning Services     Post Acute Care Choice:    Choice offered to:     DME Arranged:    DME Agency:     HH Arranged:    HH Agency:     Status of Service:  Completed, signed off  If discussed at [employer] of Stay Meetings, dates discussed:    Additional Comments:  Elza Rafter, RN 11/20/2017, 4:12 PM

## 2017-11-20 NOTE — Progress Notes (Signed)
Pt is still c/o chest pain 3-4 on a scale of 0-10, states that his "pain feels better with less pressure" BP is 86/63 infusion was stooped and MD Marcille Blanco made aware. We will continue to monitor pt's BP and CP.Per MD, we may need to restart the gtt if needed.

## 2017-11-21 LAB — HEMOGLOBIN A1C
Hgb A1c MFr Bld: 5.9 % — ABNORMAL HIGH (ref 4.8–5.6)
Mean Plasma Glucose: 123 mg/dL

## 2017-11-21 LAB — HIV ANTIBODY (ROUTINE TESTING W REFLEX): HIV SCREEN 4TH GENERATION: NONREACTIVE

## 2017-12-01 ENCOUNTER — Encounter: Payer: Self-pay | Admitting: Nurse Practitioner

## 2017-12-01 ENCOUNTER — Ambulatory Visit: Payer: Self-pay | Admitting: Nurse Practitioner

## 2017-12-01 DIAGNOSIS — R0989 Other specified symptoms and signs involving the circulatory and respiratory systems: Secondary | ICD-10-CM

## 2017-12-02 ENCOUNTER — Encounter: Payer: Self-pay | Admitting: Nurse Practitioner

## 2017-12-02 ENCOUNTER — Other Ambulatory Visit: Payer: Self-pay

## 2017-12-02 DIAGNOSIS — I2102 ST elevation (STEMI) myocardial infarction involving left anterior descending coronary artery: Secondary | ICD-10-CM

## 2017-12-02 MED ORDER — CLOPIDOGREL BISULFATE 75 MG PO TABS: 75.0000 mg | ORAL_TABLET | Freq: Every day | ORAL | 0 refills | Status: DC

## 2017-12-11 ENCOUNTER — Ambulatory Visit: Payer: Self-pay | Admitting: Nurse Practitioner

## 2017-12-11 ENCOUNTER — Ambulatory Visit: Payer: Self-pay | Admitting: Physician Assistant

## 2018-01-12 ENCOUNTER — Ambulatory Visit: Payer: Self-pay

## 2018-01-15 ENCOUNTER — Ambulatory Visit (INDEPENDENT_AMBULATORY_CARE_PROVIDER_SITE_OTHER): Payer: Self-pay | Admitting: Internal Medicine

## 2018-01-15 ENCOUNTER — Encounter: Payer: Self-pay | Admitting: Internal Medicine

## 2018-01-15 VITALS — BP 100/66 | HR 61 | Ht 60.0 in | Wt 144.5 lb

## 2018-01-15 DIAGNOSIS — I5022 Chronic systolic (congestive) heart failure: Secondary | ICD-10-CM

## 2018-01-15 DIAGNOSIS — I255 Ischemic cardiomyopathy: Secondary | ICD-10-CM

## 2018-01-15 DIAGNOSIS — E785 Hyperlipidemia, unspecified: Secondary | ICD-10-CM

## 2018-01-15 DIAGNOSIS — I2510 Atherosclerotic heart disease of native coronary artery without angina pectoris: Secondary | ICD-10-CM

## 2018-01-15 MED ORDER — CARVEDILOL 6.25 MG PO TABS: 6.2500 mg | ORAL_TABLET | Freq: Two times a day (BID) | ORAL | 3 refills | Status: DC

## 2018-01-15 NOTE — Patient Instructions (Signed)
Medication Instructions:  Your physician has recommended you make the following change in your medication:  1. Carvedilol 6.25 mg twice a day 2. January 30, 2018 last dose of Plavix (Clopidogrel)   If you need a refill on your cardiac medications before your next appointment, please call your pharmacy.   Lab work: CMP and Lipid panel in one month. Please make sure not to eat or drink anything after midnight before except water with pills.    If you have labs (blood work) drawn today and your tests are completely normal, you will receive your results only by: Marland Kitchen MyChart Message (if you have MyChart) OR . A paper copy in the mail If you have any lab test that is abnormal or we need to change your treatment, we will call you to review the results.  Testing/Procedures: None  Follow-Up: At [facility], you and your health needs are our priority.  As part of our continuing mission to provide you with exceptional heart care, we have created designated Provider Care Teams.  These Care Teams include your primary Cardiologist (physician) and Advanced Practice Providers (APPs -  Physician Assistants and Nurse Practitioners) who all work together to provide you with the care you need, when you need it. You will need a follow up appointment in 6 months.  Please call our office 2 months in advance to schedule this appointment.  You may see Nelva Bush, MD or one of the following Advanced Practice Providers on your designated Care Team:   Murray Hodgkins, NP Christell Faith, PA-C . Marrianne Mood, PA-C  Any Other Special Instructions Will Be Listed Below (If Applicable).

## 2018-01-15 NOTE — Progress Notes (Signed)
Follow-up Outpatient Visit Date: 01/15/2018  Primary Care Provider: Patient, [name] No address on file  Chief Complaint: Follow-up coronary artery disease  HPI:  Lucas [name] is a 62 y.o. year-old male with history of coronary artery disease with late presenting anterior STEMI (01/2017) status post primary PCI to proximal LAD, ischemic cardiomyopathy with chronic systolic heart failure, and borderline diabetes mellitus, who presents for follow-up of coronary artery disease and heart failure.  I last saw him in August, at which time he reported vague chest discomfort when walking quickly.  Subsequent exercise tolerance test showed subtle ST depressions with neck and jaw discomfort during peak stress and early recovery.  He was subsequently admitted with chest pain and underwent catheterization in late September.  This showed widely patent LAD stent with mild disease involving D1 and the proximal RCA.  LVEF had improved to 45% with distal anterior wall hypokinesis.  Today, Lucas [name] reports that he is feeling better his chest and right should still hurt at times when he is cold.  He notes that the pain resolves when he puts on warmer clothing.  He denies exertional chest pain, as well as shortness of breath, palpitations, lightheadedness, orthopnea, and edema.  He notes mild abdominal discomfort for a week after his hospitalization in September.  This resolved spontaneously and has not recurred.  --------------------------------------------------------------------------------------------------  Cardiovascular History & Procedures: Cardiovascular Problems:  Coronary artery disease with a late presenting anterior STEMI (01/2017)  Ischemic cardiomyopathy  Risk Factors:  Known coronary artery disease, male gender, and age greater than 27  Cath/PCI:  LHC (11/20/17): LMCA normal.  LAD with widely patent proximal stent.  20% ostial D1 stenosis.  30% proximal RCA stenosis.  LCx  normal.  LVEF 45% with distal anterior hypokinesis.  LVEDP 16 to 20 mmHg.  LHC/PCI (01/25/17): LMCA with 20% distal stenosis. LAD with 20% ostial disease followed by thrombotic occlusion of the proximal vessel. 25% mid LAD disease and 50% ostial D1 stenosis present. LCx normal. RCA with 30% proximal stenosis. Successful PCI to the proximal LAD with placement of a Xience Sierra 3.0 x 23 mm drug-eluting stent postdilated with a 3.5 mm [place] balloon at high pressure.  CV Surgery:  None  EP Procedures and Devices:  None  Non-Invasive Evaluation(s):  Exercise tolerance test (11/06/17): Abnormal, intermediate risk study with mild ST depression during stress and neck/jaw pain, concerning for anginal equivalent.  [facility] treadmill score equals 0).  TTE (05/29/2017): Normal LV size.  LVEF 40-45% with anterior, anteroseptal, and apical hypokinesis and grade 1 diastolic dysfunction.  Mild MR.  Normal RV size and function.  Normal pulmonary artery pressure.  TTE (01/26/17): Normal LV size with moderate LVH. LVEF 30-35% with severe anterior, anteroseptal, and apical hypokinesis. Grade 1 diastolic dysfunction. Mild MR. Normal RV size and function.  Recent CV Pertinent Labs: Lab Results  Component Value Date   CHOL 81 05/11/2017   HDL 29 (L) 05/11/2017   LDLCALC 29 05/11/2017   TRIG 117 05/11/2017   CHOLHDL 2.8 05/11/2017   INR 1.09 11/19/2017   K 3.3 (L) 11/20/2017   MG 2.4 11/20/2017   BUN 12 11/20/2017   BUN 12 10/28/2017   CREATININE 0.67 11/20/2017    Past medical and surgical history were reviewed and updated in EPIC.  Current Meds  Medication Sig  . aspirin 81 MG chewable tablet CHEW ONE TABLET BY MOUTH EVERY DAY (Patient taking differently: Chew 81 mg by mouth daily. )  . atorvastatin (LIPITOR) 80 MG  tablet Take 0.5 tablets (40 mg total) by mouth daily.  . carvedilol (COREG) 6.25 MG tablet Take 1 tablet (6.25 mg total) by mouth 2 (two) times daily.  . clopidogrel (PLAVIX) 75 MG  tablet Take 1 tablet (75 mg total) by mouth daily. Begin the day after taking 300mg  Clopidogrel.  . metFORMIN (GLUCOPHAGE) 500 MG tablet TAKE ONE TABLET BY MOUTH EVERY DAY WITH BREAKFAST (Patient taking differently: Take 500 mg by mouth daily with breakfast. )  . nitroGLYCERIN (NITROSTAT) 0.4 MG SL tablet Place 1 tablet (0.4 mg total) under the tongue every 5 (five) minutes as needed for chest pain. Maximum of 3 doses.  . potassium chloride (K-DUR,KLOR-CON) 10 MEQ tablet Take 1 tablet (10 mEq total) by mouth daily.  . sacubitril-valsartan (ENTRESTO) 24-26 MG Take 1 tablet by mouth 2 (two) times daily.  Marland Kitchen spironolactone (ALDACTONE) 25 MG tablet Take 0.5 tablets (12.5 mg total) by mouth daily.    Allergies: Patient has no known allergies.  Social History   Tobacco Use  . Smoking status: Never Smoker  . Smokeless tobacco: Never Used  Substance Use Topics  . Alcohol use: No    Frequency: Never  . Drug use: No    Family History  Problem Relation Age of Onset  . Heart disease Father        s/p pacemaker  . Arrhythmia Father   . Asthma Father   . Asthma Mother     Review of Systems: A 12-system review of systems was performed and was negative except as noted in the HPI.  --------------------------------------------------------------------------------------------------  Physical Exam: BP 100/66 (BP Location: Left Arm, Patient Position: Sitting, Cuff Size: Normal)   Pulse 61   Ht 5' (1.524 m)   Wt 144 lb 8 oz (65.5 kg)   BMI 28.22 kg/m   General:  NAD.  Accompanied by his son and Spanish interpreter. HEENT: No conjunctival pallor or scleral icterus. Moist mucous membranes.  OP clear. Neck: Supple without lymphadenopathy, thyromegaly, JVD, or HJR. Lungs: Normal work of breathing. Clear to auscultation bilaterally without wheezes or crackles. Heart: Regular rate and rhythm without murmurs, rubs, or gallops. Non-displaced PMI. Abd: Bowel sounds present. Soft, NT/ND without  hepatosplenomegaly Ext: No lower extremity edema. Radial, PT, and DP pulses are 2+ bilaterally.  Right radial arteriotomy well-healed. Skin: Warm and dry without rash.  EKG:  NSR with septal Q-waves and chronic ST elevation in V1.  Lab Results  Component Value Date   WBC 5.7 11/20/2017   HGB 15.0 11/20/2017   HCT 41.7 11/20/2017   MCV 92.7 11/20/2017   PLT 180 11/20/2017    Lab Results  Component Value Date   NA 140 11/20/2017   K 3.3 (L) 11/20/2017   CL 105 11/20/2017   CO2 26 11/20/2017   BUN 12 11/20/2017   CREATININE 0.67 11/20/2017   GLUCOSE 108 (H) 11/20/2017   ALT 28 05/11/2017    Lab Results  Component Value Date   CHOL 81 05/11/2017   HDL 29 (L) 05/11/2017   LDLCALC 29 05/11/2017   TRIG 117 05/11/2017   CHOLHDL 2.8 05/11/2017    --------------------------------------------------------------------------------------------------  ASSESSMENT AND PLAN: Coronary artery disease Doing well following catheterization in September, which showed a patent proximal LAD stent and otherwise mild CAD.  Continue current dose of carvedilol.  We have agreed to discontinue clopidogrel at the [place] of November, at which time Lucas [name] will be >12 months out from his STEMI.  We will continue high-intensity statin therapy.  Chronic  systolic heart failure due to ischemic cardiomyopathy Lucas [name] appears euvolemic and well-compensated with NYHA class II symptoms.  We will continue current doses of carvedilol, Entresto, and spironolactone.  We will repeat a BMP in ~1 month to ensure stable renal function and potassium.  Hyperlipidemia LDL very well-controlled on last check, which prompted deescalatoin of atorvastatin from 80 -> 40 mg daily.  We will plan to recheck LFT's and lipids when BMP is drawn next month.  Follow-up: Return to clinic in 6 months.  Nelva Bush, MD 01/15/2018 3:10 PM

## 2018-01-20 ENCOUNTER — Other Ambulatory Visit: Payer: Self-pay

## 2018-01-20 DIAGNOSIS — I2102 ST elevation (STEMI) myocardial infarction involving left anterior descending coronary artery: Secondary | ICD-10-CM

## 2018-01-20 MED ORDER — ATORVASTATIN CALCIUM 80 MG PO TABS: 40.0000 mg | ORAL_TABLET | Freq: Every day | ORAL | 0 refills | Status: DC

## 2018-02-16 ENCOUNTER — Telehealth: Payer: Self-pay | Admitting: *Deleted

## 2018-02-16 ENCOUNTER — Other Ambulatory Visit (INDEPENDENT_AMBULATORY_CARE_PROVIDER_SITE_OTHER): Payer: Self-pay | Admitting: *Deleted

## 2018-02-16 DIAGNOSIS — E785 Hyperlipidemia, unspecified: Secondary | ICD-10-CM

## 2018-02-16 DIAGNOSIS — I2102 ST elevation (STEMI) myocardial infarction involving left anterior descending coronary artery: Secondary | ICD-10-CM

## 2018-02-16 DIAGNOSIS — I2510 Atherosclerotic heart disease of native coronary artery without angina pectoris: Secondary | ICD-10-CM

## 2018-02-16 DIAGNOSIS — Z79899 Other long term (current) drug therapy: Secondary | ICD-10-CM

## 2018-02-16 DIAGNOSIS — I10 Essential (primary) hypertension: Secondary | ICD-10-CM

## 2018-02-16 MED ORDER — CLOPIDOGREL BISULFATE 75 MG PO TABS: 75.0000 mg | ORAL_TABLET | Freq: Every day | ORAL | 2 refills | Status: DC

## 2018-02-16 NOTE — Telephone Encounter (Signed)
Called patient using [employer] [phone]. Patient was home. He verbalized understanding of Dr Darnelle Bos recommendations. He will try taking the Plavix again and see if it helps. It the problem persists, he will call his PCP. New Rx sent to pharmacy in case patient needs more. He was appreciative and will call us if any new questions or concerns arise.

## 2018-02-16 NOTE — Telephone Encounter (Signed)
I have no physiologic explanation for the patient's symptoms after discontinuation of clopidogrel.  If he thinks that the medication was beneficial, I am okay for him to restart clopidogrel 75 mg daily.  If symptoms persist, I recommend that he speak with his PCP.  Nelva Bush, MD [facility] Pager: [phone]

## 2018-02-16 NOTE — Telephone Encounter (Signed)
Pt came in for his lab work this morning and mentioned to me that ever since he's D/c his plavix he has been having really bad headaches/back of the head pain that radiates to his left arm pain. Pt would like to know if he should start taking medication again or even start taking 0.5 tablet due to the pain/discomfort he is experiencing since he has d/c. Pt was made aware that I would forward message to his nurse and that she would contact him to answer his request.   Pt is a spanish speaking pt and would need interpreter.

## 2018-02-16 NOTE — Telephone Encounter (Signed)
Called patient Educational psychologist ID # [id number]. Patient says he stopped his Plavix on 01/30/18. Since stopping the Plavix, patient states whenever he does physical activity he gets a pain on left side of he head that goes down his left arm. He says it will last throughout the day. He only works out every other day so he does not have to experience this pain everyday. Patient also has some discomfort on the left side of his chest after working out. Patient is afraid it is from stopping his Plavix and would like to start back taking half the dose. Advised that stopping the Plavix may not be the cause of these symptoms. Advised patient not to start taking again until advice form Dr End. He verbalized understanding.

## 2018-02-17 LAB — COMPREHENSIVE METABOLIC PANEL
ALBUMIN: 4.4 g/dL (ref 3.6–4.8)
ALK PHOS: 61 IU/L (ref 39–117)
ALT: 19 IU/L (ref 0–44)
AST: 16 IU/L (ref 0–40)
Albumin/Globulin Ratio: 1.8 (ref 1.2–2.2)
BUN / CREAT RATIO: 13 (ref 10–24)
BUN: 11 mg/dL (ref 8–27)
Bilirubin Total: 0.5 mg/dL (ref 0.0–1.2)
CO2: 23 mmol/L (ref 20–29)
CREATININE: 0.84 mg/dL (ref 0.76–1.27)
Calcium: 9.2 mg/dL (ref 8.6–10.2)
Chloride: 104 mmol/L (ref 96–106)
GFR calc Af Amer: 110 mL/min/{1.73_m2} (ref >59)
GFR calc non Af Amer: 95 mL/min/{1.73_m2} (ref >59)
GLUCOSE: 163 mg/dL — AB (ref 65–99)
Globulin, Total: 2.4 g/dL (ref 1.5–4.5)
Potassium: 4.2 mmol/L (ref 3.5–5.2)
Sodium: 142 mmol/L (ref 134–144)
Total Protein: 6.8 g/dL (ref 6.0–8.5)

## 2018-02-17 LAB — LIPID PANEL
CHOLESTEROL TOTAL: 76 mg/dL — AB (ref 100–199)
Chol/HDL Ratio: 2.5 ratio (ref 0.0–5.0)
HDL: 30 mg/dL — ABNORMAL LOW (ref >39)
LDL CALC: 20 mg/dL (ref 0–99)
Triglycerides: 131 mg/dL (ref 0–149)
VLDL Cholesterol Cal: 26 mg/dL (ref 5–40)

## 2018-02-17 LAB — LDL CHOLESTEROL, DIRECT: LDL Direct: 33 mg/dL (ref 0–99)

## 2018-02-18 ENCOUNTER — Telehealth: Payer: Self-pay | Admitting: *Deleted

## 2018-02-18 MED ORDER — ATORVASTATIN CALCIUM 20 MG PO TABS: 20.0000 mg | ORAL_TABLET | Freq: Every day | ORAL | 3 refills | Status: DC

## 2018-02-18 NOTE — Telephone Encounter (Signed)
Called patient using [employer] [phone]. Patient verbalized understanding of instructions and plan of care. He also wanted me to discuss with his son as well who helps him with his medications.  Son verbalized understanding to decrease atorvastatin to 20 mg once a day and to work on diet and exercise to increase the HDL. He verbalized understanding of the results as well. Rx sent to pharmacy.

## 2018-02-18 NOTE — Telephone Encounter (Signed)
-----   Message from Nelva Bush, MD sent at 02/17/2018  5:28 PM EST ----- Please let [name] know that his kidney and liver function remain normal.  His LDL remains very well-controlled; his HDL (good cholesterol) is a bit low.  I recommend decreasing atorvastatin to 20 mg daily and working on diet and exercise to help improve his HDL.

## 2018-03-16 ENCOUNTER — Other Ambulatory Visit: Payer: Self-pay

## 2018-03-16 DIAGNOSIS — R7303 Prediabetes: Secondary | ICD-10-CM

## 2018-03-16 DIAGNOSIS — I10 Essential (primary) hypertension: Secondary | ICD-10-CM

## 2018-03-16 DIAGNOSIS — I5022 Chronic systolic (congestive) heart failure: Secondary | ICD-10-CM

## 2018-03-16 DIAGNOSIS — I2102 ST elevation (STEMI) myocardial infarction involving left anterior descending coronary artery: Secondary | ICD-10-CM

## 2018-03-16 MED ORDER — ASPIRIN 81 MG PO CHEW: CHEWABLE_TABLET | ORAL | 0 refills | Status: DC

## 2018-03-16 MED ORDER — METFORMIN HCL 500 MG PO TABS: ORAL_TABLET | ORAL | 0 refills | Status: DC

## 2018-03-19 ENCOUNTER — Telehealth: Payer: Self-pay | Admitting: Internal Medicine

## 2018-03-19 NOTE — Telephone Encounter (Signed)
Patient requesting refill for potassium Informed he would need to contact PCP

## 2018-04-22 ENCOUNTER — Ambulatory Visit: Payer: Self-pay | Admitting: Gerontology

## 2018-04-22 ENCOUNTER — Other Ambulatory Visit: Payer: Self-pay

## 2018-04-22 VITALS — BP 122/76 | HR 55 | Temp 98.2°F | Wt 147.6 lb

## 2018-04-22 DIAGNOSIS — I5022 Chronic systolic (congestive) heart failure: Secondary | ICD-10-CM

## 2018-04-22 DIAGNOSIS — I2102 ST elevation (STEMI) myocardial infarction involving left anterior descending coronary artery: Secondary | ICD-10-CM

## 2018-04-22 DIAGNOSIS — R7303 Prediabetes: Secondary | ICD-10-CM

## 2018-04-22 DIAGNOSIS — I10 Essential (primary) hypertension: Secondary | ICD-10-CM

## 2018-04-22 MED ORDER — SPIRONOLACTONE 25 MG PO TABS: 12.5000 mg | ORAL_TABLET | Freq: Every day | ORAL | 2 refills | Status: DC

## 2018-04-24 ENCOUNTER — Encounter: Payer: Self-pay | Admitting: Gerontology

## 2018-04-24 NOTE — Progress Notes (Signed)
Established Patient Office Visit  Subjective:  Patient ID: Lucas [name], male    DOB: 03-Jan-1956  Age: 63 y.o. MRN: [id number]  CC: No chief complaint on file.   HPI Lucas [name] presents for follow up on his DM,Hypertension, Hyperlipidemia and medication refill. He reports that he takes 500 mg of Metformin daily, and does not check blood glucose. He denies hypo/hyperglycermia, polyuria, polyphagia, peripheral neuropathy and vision changes. His blood glucose was rechecked during visit and it was 96 mg/dl and HgbA1c was 5.9%.  He states that he had MI in 11/2017 and continues to take Coreg 6.25mg  bid, Spironolactone12.5 mg daily, Clopidogrel  75 mg daily, Entresto 24-25 mg daily and Potasium chloride 10 meq daily. He denies chest pain, palpitation, shortness of breath, dizziness, hematuria and hematochizia. He admits to taking 20 mg Atorvastatin and denies mayalgia, will recheck his lipids in 3 months. Otherwise he denies no further concerns    Past Medical History:  Diagnosis Date  . Acute ST elevation myocardial infarction (STEMI) involving left anterior descending (LAD) coronary artery ([place])    a. 01/2017 Ant STEMI/PCI: LAD 20ost, 100p (3.0x23 Xience Sierra DES).  . CAD (coronary artery disease)    a. 01/2017 Ant STEMI/PCI: LM 20d, LAD 20ost, 100p (3.0x23 Xience Sierra DES), 61m, D1 50ost, LCX nl, RCA 30p; b. 11/2017 MV: Intermediate risk w/ 25mm inf ST dep; c. 11/2017 Cath: LM nl, LAD patent prox stent, D1 20ost, LCX nl, RCA 30p. EF 45-50%.  . Chronic combined systolic (congestive) and diastolic (congestive) heart failure ([place])    a. 01/2018 Echo: EF 30-35%, sev antsept, ant, apical HK. Gr1 DD, mild MR.  . Essential hypertension   . Hyperlipidemia    a. Myalgias with high dose lipitor - tolerating 40mg  daily.  . Ischemic cardiomyopathy    a. 01/2018 Echo: EF 30-35%.  . Type II diabetes mellitus ([place])     Past Surgical History:  Procedure Laterality Date  . APPENDECTOMY     . CARDIAC CATHETERIZATION    . CORONARY ANGIOPLASTY    . CORONARY/GRAFT ACUTE MI REVASCULARIZATION N/A 01/25/2017   Procedure: Coronary/Graft Acute MI Revascularization;  Surgeon: Nelva Bush, MD;  Location: [facility];  Service: Cardiovascular;  Laterality: N/A;  . LEFT HEART CATH AND CORONARY ANGIOGRAPHY N/A 01/25/2017   Procedure: LEFT HEART CATH AND CORONARY ANGIOGRAPHY;  Surgeon: Nelva Bush, MD;  Location: [facility];  Service: Cardiovascular;  Laterality: N/A;  . LEFT HEART CATH AND CORONARY ANGIOGRAPHY N/A 11/20/2017   Procedure: LEFT HEART CATH AND CORONARY ANGIOGRAPHY;  Surgeon: Wellington Hampshire, MD;  Location: [facility];  Service: Cardiovascular;  Laterality: N/A;    Family History  Problem Relation Age of Onset  . Heart disease Father        s/p pacemaker  . Arrhythmia Father   . Asthma Father   . Asthma Mother     Social History   Socioeconomic History  . Marital status: Married    Spouse name: Not on file  . Number of children: Not on file  . Years of education: Not on file  . Highest education level: Not on file  Occupational History  . Not on file  Social Needs  . Financial resource strain: Hard  . Food insecurity:    Worry: Never true    Inability: Never true  . Transportation needs:    Medical: Not on file    Non-medical: Not on file  Tobacco Use  . Smoking status:  Never Smoker  . Smokeless tobacco: Never Used  Substance and Sexual Activity  . Alcohol use: No    Frequency: Never  . Drug use: No  . Sexual activity: Yes    Birth control/protection: Coitus interruptus, None  Lifestyle  . Physical activity:    Days per week: 7 days    Minutes per session: 30 min  . Stress: Patient refused  Relationships  . Social connections:    Talks on phone: Patient refused    Gets together: Patient refused    Attends religious service: Patient refused    Active member of club or organization: Patient refused     Attends meetings of clubs or organizations: Patient refused    Relationship status: Patient refused  . Intimate partner violence:    Fear of current or ex partner: Not on file    Emotionally abused: Not on file    Physically abused: Not on file    Forced sexual activity: Not on file  Other Topics Concern  . Not on file  Social History Narrative  . Not on file    Outpatient Medications Prior to Visit  Medication Sig Dispense Refill  . aspirin 81 MG chewable tablet CHEW ONE TABLET BY MOUTH EVERY DAY 90 tablet 0  . atorvastatin (LIPITOR) 20 MG tablet Take 1 tablet (20 mg total) by mouth daily. 90 tablet 3  . carvedilol (COREG) 6.25 MG tablet Take 1 tablet (6.25 mg total) by mouth 2 (two) times daily. 180 tablet 3  . clopidogrel (PLAVIX) 75 MG tablet Take 1 tablet (75 mg total) by mouth daily. 90 tablet 2  . metFORMIN (GLUCOPHAGE) 500 MG tablet TAKE ONE TABLET BY MOUTH EVERY DAY WITH BREAKFAST 90 tablet 0  . nitroGLYCERIN (NITROSTAT) 0.4 MG SL tablet Place 1 tablet (0.4 mg total) under the tongue every 5 (five) minutes as needed for chest pain. Maximum of 3 doses. 90 tablet 3  . potassium chloride (K-DUR,KLOR-CON) 10 MEQ tablet Take 1 tablet (10 mEq total) by mouth daily. 90 tablet 0  . sacubitril-valsartan (ENTRESTO) 24-26 MG Take 1 tablet by mouth 2 (two) times daily. 180 tablet 3  . spironolactone (ALDACTONE) 25 MG tablet Take 0.5 tablets (12.5 mg total) by mouth daily. 45 tablet 0   No facility-administered medications prior to visit.     No Known Allergies  ROS Review of Systems  Constitutional: Negative.   HENT: Negative.   Eyes: Negative.   Respiratory: Negative.   Cardiovascular: Negative.   Gastrointestinal: Negative.   Endocrine: Negative.   Genitourinary: Negative.   Musculoskeletal: Negative.   Skin: Negative.   Neurological: Negative.   Hematological: Negative.   Psychiatric/Behavioral: Negative.       Objective:    Physical Exam  Constitutional: He is  oriented to person, place, and time. He appears well-developed and well-nourished.  HENT:  Head: Normocephalic and atraumatic.  Eyes: Pupils are equal, round, and reactive to light. EOM are normal.  Neck: Normal range of motion.  Cardiovascular: Regular rhythm and normal heart sounds. Bradycardia present.  Pulmonary/Chest: Effort normal and breath sounds normal.  Abdominal: Soft.  Musculoskeletal: Normal range of motion.  Neurological: He is alert and oriented to person, place, and time.  Skin: Skin is warm.  Psychiatric: He has a normal mood and affect. His behavior is normal. Judgment and thought content normal.    BP 122/76 (BP Location: Left Arm, Patient Position: Sitting, Cuff Size: Normal)   Pulse (!) 55   Temp 98.2 F (36.8 C)  Wt 147 lb 9.6 oz (67 kg)   BMI 28.83 kg/m  Wt Readings from Last 3 Encounters:  04/24/18 147 lb 9.6 oz (67 kg)  01/15/18 144 lb 8 oz (65.5 kg)  11/20/17 139 lb 15.9 oz (63.5 kg)   He was advised to continue on weight loss regimen, exercise 30 minutes daily.  Health Maintenance Due  Topic Date Due  . Hepatitis C Screening  12-23-1955  . PNEUMOCOCCAL POLYSACCHARIDE VACCINE AGE 26-64 HIGH RISK  11/17/1959  . FOOT EXAM  11/17/1967  . OPHTHALMOLOGY EXAM  11/17/1967  . URINE MICROALBUMIN  11/17/1967  . TETANUS/TDAP  11/16/1976  . COLONOSCOPY  11/17/2007    There are no preventive care reminders to display for this patient.  Lab Results  Component Value Date   TSH 2.060 01/25/2017   Lab Results  Component Value Date   WBC 5.7 11/20/2017   HGB 15.0 11/20/2017   HCT 41.7 11/20/2017   MCV 92.7 11/20/2017   PLT 180 11/20/2017   Lab Results  Component Value Date   NA 142 02/16/2018   K 4.2 02/16/2018   CO2 23 02/16/2018   GLUCOSE 163 (H) 02/16/2018   BUN 11 02/16/2018   CREATININE 0.84 02/16/2018   BILITOT 0.5 02/16/2018   ALKPHOS 61 02/16/2018   AST 16 02/16/2018   ALT 19 02/16/2018   PROT 6.8 02/16/2018   ALBUMIN 4.4 02/16/2018    CALCIUM 9.2 02/16/2018   ANIONGAP 9 11/20/2017   Lab Results  Component Value Date   CHOL 76 (L) 02/16/2018   Lab Results  Component Value Date   HDL 30 (L) 02/16/2018   Lab Results  Component Value Date   LDLCALC 20 02/16/2018   Lab Results  Component Value Date   TRIG 131 02/16/2018   Lab Results  Component Value Date   CHOLHDL 2.5 02/16/2018   Lab Results  Component Value Date   HGBA1C 5.9 (H) 11/20/2017      Assessment & Plan:   Problem List Items Addressed This Visit      Cardiovascular and Mediastinum   Chronic systolic heart failure (HCC) (Chronic)   STEMI involving left anterior descending coronary artery ([place])    1. Chronic systolic heart failure ([place]) - He will continue on current treatment regimen, Will decrease Coreg to 3.125 mg if Bradycardia continues.  2. Essential hypertension - Controlled blood pressure, will continue on current regimen.  3. Prediabetes - Controlled blood glucose, he will continue on current treatment regimen.   No orders of the defined types were placed in this encounter.   Follow-up: No follow-ups on file.  Follow up in 3 months or worsening symptoms.  Norie Latendresse Jerold Coombe, NP

## 2018-04-29 ENCOUNTER — Telehealth: Payer: Self-pay | Admitting: Adult Health Nurse Practitioner

## 2018-04-29 NOTE — Telephone Encounter (Signed)
Pt asked that we call his wife to schedule his 25mth F/U apt at the [facility], left message to give [place] a call back

## 2018-06-09 ENCOUNTER — Other Ambulatory Visit: Payer: Self-pay

## 2018-06-09 DIAGNOSIS — I10 Essential (primary) hypertension: Secondary | ICD-10-CM

## 2018-06-09 DIAGNOSIS — R7303 Prediabetes: Secondary | ICD-10-CM

## 2018-06-09 DIAGNOSIS — I2102 ST elevation (STEMI) myocardial infarction involving left anterior descending coronary artery: Secondary | ICD-10-CM

## 2018-06-09 DIAGNOSIS — I5022 Chronic systolic (congestive) heart failure: Secondary | ICD-10-CM

## 2018-06-09 MED ORDER — METFORMIN HCL 500 MG PO TABS: ORAL_TABLET | ORAL | 0 refills | Status: DC

## 2018-06-09 MED ORDER — ASPIRIN 81 MG PO CHEW: CHEWABLE_TABLET | ORAL | 0 refills | Status: DC

## 2018-06-22 ENCOUNTER — Telehealth: Payer: Self-pay | Admitting: Pharmacist

## 2018-06-22 NOTE — Telephone Encounter (Signed)
06/22/2018 10:45:23 AM - Lucas [name] renewal  06/22/2018 Time for patient to renew with [employer] for [employer] [id number] [place] the provider portion to sign & return by interoffice. Also mailing patient his portion to sign & return.Lucas [name]

## 2018-07-02 ENCOUNTER — Telehealth: Payer: Self-pay | Admitting: Pharmacist

## 2018-07-02 NOTE — Telephone Encounter (Signed)
07/02/2018 12:23:19 PM - Delene Loll pending  07/02/2018 I have received the provider portion of [employer] application for Entresto renewal--holding for patient to return his portion--mailed to patient 06/22/2018, also requested current income/support & taxes/[id number].Delos Haring

## 2018-07-19 ENCOUNTER — Ambulatory Visit: Payer: Self-pay

## 2018-07-19 ENCOUNTER — Other Ambulatory Visit: Payer: Self-pay

## 2018-07-19 NOTE — Progress Notes (Unsigned)
  Medication Management Clinic Visit Note  Patient: Lucas [name] MRN: [id number] Date of Birth: 1955-05-21 PCP: Patient, [name]   Irbin Palestino 63 y.o. male presents for a *** visit today.  There were no vitals taken for this visit.  Patient Information   Past Medical History:  Diagnosis Date  . Acute ST elevation myocardial infarction (STEMI) involving left anterior descending (LAD) coronary artery ([place])    a. 01/2017 Ant STEMI/PCI: LAD 20ost, 100p (3.0x23 Xience Sierra DES).  . CAD (coronary artery disease)    a. 01/2017 Ant STEMI/PCI: LM 20d, LAD 20ost, 100p (3.0x23 Xience Sierra DES), 80m, D1 50ost, LCX nl, RCA 30p; b. 11/2017 MV: Intermediate risk w/ 19mm inf ST dep; c. 11/2017 Cath: LM nl, LAD patent prox stent, D1 20ost, LCX nl, RCA 30p. EF 45-50%.  . Chronic combined systolic (congestive) and diastolic (congestive) heart failure ([place])    a. 01/2018 Echo: EF 30-35%, sev antsept, ant, apical HK. Gr1 DD, mild MR.  . Essential hypertension   . Hyperlipidemia    a. Myalgias with high dose lipitor - tolerating 40mg  daily.  . Ischemic cardiomyopathy    a. 01/2018 Echo: EF 30-35%.  . Type II diabetes mellitus ([place])       Past Surgical History:  Procedure Laterality Date  . APPENDECTOMY    . CARDIAC CATHETERIZATION    . CORONARY ANGIOPLASTY    . CORONARY/GRAFT ACUTE MI REVASCULARIZATION N/A 01/25/2017   Procedure: Coronary/Graft Acute MI Revascularization;  Surgeon: Nelva Bush, MD;  Location: [facility];  Service: Cardiovascular;  Laterality: N/A;  . LEFT HEART CATH AND CORONARY ANGIOGRAPHY N/A 01/25/2017   Procedure: LEFT HEART CATH AND CORONARY ANGIOGRAPHY;  Surgeon: Nelva Bush, MD;  Location: [facility];  Service: Cardiovascular;  Laterality: N/A;  . LEFT HEART CATH AND CORONARY ANGIOGRAPHY N/A 11/20/2017   Procedure: LEFT HEART CATH AND CORONARY ANGIOGRAPHY;  Surgeon: Wellington Hampshire, MD;  Location: [facility];  Service:  Cardiovascular;  Laterality: N/A;     Family History  Problem Relation Age of Onset  . Heart disease Father        s/p pacemaker  . Arrhythmia Father   . Asthma Father   . Asthma Mother     New Diagnoses (since last visit):   Family Support: {Family Support:210800003}  Lifestyle Diet: Breakfast:*** Lunch:*** Dinner:*** Drinks:***            Social History   Substance and Sexual Activity  Alcohol Use No  . Frequency: Never      Social History   Tobacco Use  Smoking Status Never Smoker  Smokeless Tobacco Never Used      Health Maintenance  Topic Date Due  . Hepatitis C Screening  03-08-1955  . PNEUMOCOCCAL POLYSACCHARIDE VACCINE AGE 14-64 HIGH RISK  11/17/1959  . FOOT EXAM  11/17/1967  . OPHTHALMOLOGY EXAM  11/17/1967  . URINE MICROALBUMIN  11/17/1967  . TETANUS/TDAP  11/16/1976  . COLONOSCOPY  11/17/2007  . HEMOGLOBIN A1C  05/21/2018  . INFLUENZA VACCINE  10/02/2018  . HIV Screening  Completed     Assessment and Plan:

## 2018-07-27 ENCOUNTER — Other Ambulatory Visit: Payer: Self-pay

## 2018-07-27 ENCOUNTER — Telehealth: Payer: Self-pay | Admitting: Urology

## 2018-07-27 ENCOUNTER — Ambulatory Visit: Payer: Self-pay | Admitting: Urology

## 2018-07-27 DIAGNOSIS — R7303 Prediabetes: Secondary | ICD-10-CM

## 2018-07-27 DIAGNOSIS — I2102 ST elevation (STEMI) myocardial infarction involving left anterior descending coronary artery: Secondary | ICD-10-CM

## 2018-07-27 DIAGNOSIS — I5022 Chronic systolic (congestive) heart failure: Secondary | ICD-10-CM

## 2018-07-27 NOTE — Progress Notes (Signed)
Virtual Visit via Telephone Note  I connected with Lucas [name] on 07/27/2018 at [facility] by telephone and verified that I am speaking with the correct person using two identifiers with interpreter, Jacqui.  They are located at home.    I am located at my home.    This visit type was conducted due to national recommendations for restrictions regarding the COVID-19 Pandemic (e.g. social distancing).  This format is felt to be most appropriate for this patient at this time.  All issues noted in this document were discussed and addressed.  No physical exam was performed.   I discussed the limitations, risks, security and privacy concerns of performing an evaluation and management service by telephone and the availability of in person appointments. I also discussed with the patient that there may be a patient responsible charge related to this service. The patient expressed understanding and agreed to proceed.   History of Present Illness: Hx of MI  - no complaints of CP or SOB  HTN  - cannot check BP at home  Pre-diabetes  - last HgB A1c 6.3% in 03/2017     Observations/Objective: Patient is answering questions appropriately.    Assessment and Plan:  1. Hx of MI  - has appointment in 09/2018 with cardiology  - continue meds:  Lipitor, coreg, plavix, nitrostat, entresto and spironolactone   2. HTN  - will get him a home BP monitor  3. Pre-diabetes  - will get him in for labs    Follow Up Instructions:   [name] will follow up in one month with labs and office visit.    I discussed the assessment and treatment plan with the patient. The patient was provided an opportunity to ask questions and all were answered. The patient agreed with the plan and demonstrated an understanding of the instructions.   The patient was advised to call back or seek an in-person evaluation if the symptoms worsen or if the condition fails to improve as anticipated.  I provided 10 minutes of  non-face-to-face time during this encounter.   Alcide Memoli, PA-C

## 2018-07-27 NOTE — Telephone Encounter (Signed)
Would you schedule him an appointment in June with labs prior?

## 2018-07-28 MED ORDER — CLOPIDOGREL BISULFATE 75 MG PO TABS: 75.0000 mg | ORAL_TABLET | Freq: Every day | ORAL | 2 refills | Status: DC

## 2018-07-28 MED ORDER — SACUBITRIL-VALSARTAN 24-26 MG PO TABS: 1.0000 | ORAL_TABLET | Freq: Two times a day (BID) | ORAL | 3 refills | Status: DC

## 2018-07-28 MED ORDER — SPIRONOLACTONE 25 MG PO TABS: 12.5000 mg | ORAL_TABLET | Freq: Every day | ORAL | 2 refills | Status: DC

## 2018-07-28 MED ORDER — METFORMIN HCL 500 MG PO TABS: ORAL_TABLET | ORAL | 0 refills | Status: DC

## 2018-07-28 NOTE — Telephone Encounter (Signed)
Patient was scheduled a lab on 08/19/2018 and MD FU 08/24/2018.

## 2018-08-06 ENCOUNTER — Encounter: Payer: Self-pay | Admitting: Urology

## 2018-08-19 ENCOUNTER — Other Ambulatory Visit: Payer: Self-pay

## 2018-08-19 DIAGNOSIS — I5022 Chronic systolic (congestive) heart failure: Secondary | ICD-10-CM

## 2018-08-20 LAB — CBC WITH DIFFERENTIAL/PLATELET
Basophils Absolute: 0 10*3/uL (ref 0.0–0.2)
Basos: 0 %
EOS (ABSOLUTE): 0.2 10*3/uL (ref 0.0–0.4)
Eos: 2 %
Hematocrit: 45.7 % (ref 37.5–51.0)
Hemoglobin: 15.5 g/dL (ref 13.0–17.7)
Immature Grans (Abs): 0 10*3/uL (ref 0.0–0.1)
Immature Granulocytes: 0 %
Lymphocytes Absolute: 2.1 10*3/uL (ref 0.7–3.1)
Lymphs: 29 %
MCH: 32.2 pg (ref 26.6–33.0)
MCHC: 33.9 g/dL (ref 31.5–35.7)
MCV: 95 fL (ref 79–97)
Monocytes Absolute: 0.6 10*3/uL (ref 0.1–0.9)
Monocytes: 9 %
Neutrophils Absolute: 4.2 10*3/uL (ref 1.4–7.0)
Neutrophils: 60 %
Platelets: 209 10*3/uL (ref 150–450)
RBC: 4.82 x10E6/uL (ref 4.14–5.80)
RDW: 12.7 % (ref 11.6–15.4)
WBC: 7.2 10*3/uL (ref 3.4–10.8)

## 2018-08-20 LAB — COMPREHENSIVE METABOLIC PANEL
ALT: 17 IU/L (ref 0–44)
AST: 14 IU/L (ref 0–40)
Albumin/Globulin Ratio: 1.8 (ref 1.2–2.2)
Albumin: 4.5 g/dL (ref 3.8–4.9)
Alkaline Phosphatase: 57 IU/L (ref 39–117)
BUN/Creatinine Ratio: 22 (ref 10–24)
BUN: 19 mg/dL (ref 8–27)
Bilirubin Total: 0.4 mg/dL (ref 0.0–1.2)
CO2: 24 mmol/L (ref 20–29)
Calcium: 9 mg/dL (ref 8.6–10.2)
Chloride: 99 mmol/L (ref 96–106)
Creatinine, Ser: 0.87 mg/dL (ref 0.76–1.27)
GFR calc Af Amer: 108 mL/min/{1.73_m2} (ref >59)
GFR calc non Af Amer: 94 mL/min/{1.73_m2} (ref >59)
Globulin, Total: 2.5 g/dL (ref 1.5–4.5)
Glucose: 156 mg/dL — ABNORMAL HIGH (ref 65–99)
Potassium: 4.1 mmol/L (ref 3.5–5.2)
Sodium: 137 mmol/L (ref 134–144)
Total Protein: 7 g/dL (ref 6.0–8.5)

## 2018-08-20 LAB — LIPID PANEL
Chol/HDL Ratio: 3.1 ratio (ref 0.0–5.0)
Cholesterol, Total: 96 mg/dL — ABNORMAL LOW (ref 100–199)
HDL: 31 mg/dL — ABNORMAL LOW (ref >39)
LDL Calculated: 29 mg/dL (ref 0–99)
Triglycerides: 180 mg/dL — ABNORMAL HIGH (ref 0–149)
VLDL Cholesterol Cal: 36 mg/dL (ref 5–40)

## 2018-08-20 LAB — TSH: TSH: 3.16 u[IU]/mL (ref 0.450–4.500)

## 2018-08-20 LAB — HEMOGLOBIN A1C
Est. average glucose Bld gHb Est-mCnc: 123 mg/dL
Hgb A1c MFr Bld: 5.9 % — ABNORMAL HIGH (ref 4.8–5.6)

## 2018-08-24 ENCOUNTER — Ambulatory Visit: Payer: Self-pay | Admitting: Urology

## 2018-08-24 ENCOUNTER — Other Ambulatory Visit: Payer: Self-pay

## 2018-08-24 DIAGNOSIS — I5022 Chronic systolic (congestive) heart failure: Secondary | ICD-10-CM

## 2018-08-24 DIAGNOSIS — I2102 ST elevation (STEMI) myocardial infarction involving left anterior descending coronary artery: Secondary | ICD-10-CM

## 2018-08-24 NOTE — Progress Notes (Signed)
Virtual Visit via Telephone Note  I connected with Lucas [name] on 08/24/18 at  6:15 PM EDT by telephone and verified that I am speaking with the correct person using two identifiers.  Location: Patient: Home Provider: Office    I discussed the limitations, risks, security and privacy concerns of performing an evaluation and management service by telephone and the availability of in person appointments. I also discussed with the patient that there may be a patient responsible charge related to this service. The patient expressed understanding and agreed to proceed.   History of Present Illness: Go over recent labs  - LDL 29  - Hgb A1c 5.9%   Observations/Objective:   Assessment and Plan:  1. CAD  - continue meds  - see cardiology  2. HLD  - good control     Follow Up Instructions:  Keep cardiology appointment in July and follow in August 25 @ 6:30   I discussed the assessment and treatment plan with the patient. The patient was provided an opportunity to ask questions and all were answered. The patient agreed with the plan and demonstrated an understanding of the instructions.   The patient was advised to call back or seek an in-person evaluation if the symptoms worsen or if the condition fails to improve as anticipated.  I provided 15 minutes of non-face-to-face time during this encounter.   Umaima Scholten, PA-C

## 2018-08-31 NOTE — Progress Notes (Signed)
Follow-up Outpatient Visit Date: 09/01/2018  Primary Care Provider: Patient, [name] No address on file  Chief Complaint: Follow-up coronary artery disease  HPI:  [name] is a 63 y.o. year-old male with history of coronary artery disease with late presenting anterior STEMI (01/2017) status post primary PCI to proximal LAD, ischemic cardiomyopathy with chronic systolic heart failure, and borderline diabetes mellitus, who presents for follow-up of coronary artery disease and heart failure.  I last saw him in November, at which time he was feeling better, though he continued to have occasional right-sided chest pain (especially when he gets cold).  LHC for recurrent chest pain last summer showed widely patent LAD stent with mild disease involving D1.  We agreed to stop clopidogrel after having completed 1 year of DAPT.  No other medication changes or additional testing were pursued.  Today, [name] reports that he has noticed occasional chest pain over the last few months.  He attributes this to increased stress surrounding the COVID-19 pandemic.  It was not exertional.  A few weeks ago, he restarted clopidogrel and feels like that has resolved his chest pain.  He feels well today.  He has not had any shortness of breath, palpitations, lightheadedness, orthopnea, PND, or edema.  He is walking regularly without symptoms.  He notices an increase in his heart rate when he jogs but no associated chest pain.  He has not had any bleeding.  He remains compliant with his medications.   --------------------------------------------------------------------------------------------------  Cardiovascular History & Procedures: Cardiovascular Problems:  Coronary artery disease with a late presenting anterior STEMI (01/2017)  Ischemic cardiomyopathy  Risk Factors:  Known coronary artery disease, male gender, and age greater than 76  Cath/PCI:  LHC (11/20/17): LMCA normal.  LAD with widely  patent proximal stent.  20% ostial D1 stenosis.  30% proximal RCA stenosis.  LCx normal.  LVEF 45% with distal anterior hypokinesis.  LVEDP 16 to 20 mmHg.  LHC/PCI (01/25/17): LMCA with 20% distal stenosis. LAD with 20% ostial disease followed by thrombotic occlusion of the proximal vessel. 25% mid LAD disease and 50% ostial D1 stenosis present. LCx normal. RCA with 30% proximal stenosis. Successful PCI to the proximal LAD with placement of a Xience Sierra 3.0 x 23 mm drug-eluting stent postdilated with a 3.5 mm [place] balloon at high pressure.  CV Surgery:  None  EP Procedures and Devices:  None  Non-Invasive Evaluation(s):  Exercise tolerance test (11/06/17): Abnormal, intermediate risk study with mild ST depression during stress and neck/jaw pain, concerning for anginal equivalent.  [facility] treadmill score equals 0).  TTE (05/29/2017): Normal LV size. LVEF 40-45% with anterior, anteroseptal, and apical hypokinesis and grade 1 diastolic dysfunction. Mild MR. Normal RV size and function. Normal pulmonary artery pressure.  TTE (01/26/17): Normal LV size with moderate LVH. LVEF 30-35% with severe anterior, anteroseptal, and apical hypokinesis. Grade 1 diastolic dysfunction. Mild MR. Normal RV size and function.  Recent CV Pertinent Labs: Lab Results  Component Value Date   CHOL 96 (L) 08/19/2018   HDL 31 (L) 08/19/2018   LDLCALC 29 08/19/2018   LDLDIRECT 33 02/16/2018   TRIG 180 (H) 08/19/2018   CHOLHDL 3.1 08/19/2018   CHOLHDL 2.8 05/11/2017   INR 1.09 11/19/2017   K 4.1 08/19/2018   MG 2.4 11/20/2017   BUN 19 08/19/2018   CREATININE 0.87 08/19/2018    Past medical and surgical history were reviewed and updated in EPIC.  Current Meds  Medication Sig   aspirin 81  MG chewable tablet CHEW ONE TABLET BY MOUTH EVERY DAY   atorvastatin (LIPITOR) 20 MG tablet Take 1 tablet (20 mg total) by mouth daily. (Patient taking differently: Take 10 mg by mouth daily. )   carvedilol  (COREG) 6.25 MG tablet Take 1 tablet (6.25 mg total) by mouth 2 (two) times daily.   clopidogrel (PLAVIX) 75 MG tablet Take 1 tablet (75 mg total) by mouth daily.   metFORMIN (GLUCOPHAGE) 500 MG tablet TAKE ONE TABLET BY MOUTH EVERY DAY WITH BREAKFAST   nitroGLYCERIN (NITROSTAT) 0.4 MG SL tablet Place 1 tablet (0.4 mg total) under the tongue every 5 (five) minutes as needed for chest pain. Maximum of 3 doses.   sacubitril-valsartan (ENTRESTO) 24-26 MG Take 1 tablet by mouth 2 (two) times daily.   spironolactone (ALDACTONE) 25 MG tablet Take 0.5 tablets (12.5 mg total) by mouth daily.    Allergies: Patient has no known allergies.  Social History   Tobacco Use   Smoking status: Never Smoker   Smokeless tobacco: Never Used  Substance Use Topics   Alcohol use: No    Frequency: Never   Drug use: No    Family History  Problem Relation Age of Onset   Heart disease Father        s/p pacemaker   Arrhythmia Father    Asthma Father    Asthma Mother     Review of Systems: A 12-system review of systems was performed and was negative except as noted in the HPI.  --------------------------------------------------------------------------------------------------  Physical Exam: BP 122/82 (BP Location: Left Arm, Patient Position: Sitting, Cuff Size: Normal)    Pulse 61    Ht 5' (1.524 m)    Wt 148 lb (67.1 kg)    BMI 28.90 kg/m   General: NAD.  Accompanied by Spanish interpreter. HEENT: No conjunctival pallor or scleral icterus.  Face mask in place.   Neck: Supple without lymphadenopathy, thyromegaly, JVD, or HJR. No carotid bruit. Lungs: Normal work of breathing. Clear to auscultation bilaterally without wheezes or crackles. Heart: Regular rate and rhythm without murmurs, rubs, or gallops. Non-displaced PMI. Abd: Bowel sounds present. Soft, NT/ND without hepatosplenomegaly Ext: No lower extremity edema. Radial, PT, and DP pulses are 2+ bilaterally. Skin: Warm and dry without  rash.  EKG: Normal sinus rhythm with poor R wave progression.  Lab Results  Component Value Date   WBC 7.2 08/19/2018   HGB 15.5 08/19/2018   HCT 45.7 08/19/2018   MCV 95 08/19/2018   PLT 209 08/19/2018    Lab Results  Component Value Date   NA 137 08/19/2018   K 4.1 08/19/2018   CL 99 08/19/2018   CO2 24 08/19/2018   BUN 19 08/19/2018   CREATININE 0.87 08/19/2018   GLUCOSE 156 (H) 08/19/2018   ALT 17 08/19/2018    Lab Results  Component Value Date   CHOL 96 (L) 08/19/2018   HDL 31 (L) 08/19/2018   LDLCALC 29 08/19/2018   LDLDIRECT 33 02/16/2018   TRIG 180 (H) 08/19/2018   CHOLHDL 3.1 08/19/2018    --------------------------------------------------------------------------------------------------  ASSESSMENT AND PLAN: Coronary artery disease with stable angina: Ms. Lucas [name] is doing well today.  He has experienced him intermittent chest pain that seems to be related more to anxiety surrounding the COVID-19 pandemic.  Interestingly, his discomfort improved with restarting clopidogrel.  Catheterization last summer in the setting of similar chest pain showed patent LAD stent with mild disease involving diagonal branch.  I do not think that further ischemia testing  is indicated at this time.  We will plan to continue indefinite dual antiplatelet therapy with aspirin and clopidogrel as well as secondary prevention.  Hyperlipidemia: LDL has been very well controlled on atorvastatin 10 mg daily.  We will plan to continue this indefinitely.  Chronic systolic heart failure due to schemic cardiomyopathy: [name] appears euvolemic and well compensated with NYHA class I-2 heart failure symptoms.  LVEF noted to be 45% on most recent assessment.  We will continue current medications today.  Follow-up: Return to clinic in 6 months.  Nelva Bush, MD 09/02/2018 11:05 AM

## 2018-09-01 ENCOUNTER — Ambulatory Visit (INDEPENDENT_AMBULATORY_CARE_PROVIDER_SITE_OTHER): Payer: Self-pay | Admitting: Internal Medicine

## 2018-09-01 ENCOUNTER — Encounter: Payer: Self-pay | Admitting: Internal Medicine

## 2018-09-01 ENCOUNTER — Other Ambulatory Visit: Payer: Self-pay

## 2018-09-01 VITALS — BP 122/82 | HR 61 | Ht 60.0 in | Wt 148.0 lb

## 2018-09-01 DIAGNOSIS — I255 Ischemic cardiomyopathy: Secondary | ICD-10-CM

## 2018-09-01 DIAGNOSIS — I25118 Atherosclerotic heart disease of native coronary artery with other forms of angina pectoris: Secondary | ICD-10-CM

## 2018-09-01 DIAGNOSIS — I5022 Chronic systolic (congestive) heart failure: Secondary | ICD-10-CM

## 2018-09-01 DIAGNOSIS — E785 Hyperlipidemia, unspecified: Secondary | ICD-10-CM

## 2018-09-01 NOTE — Patient Instructions (Signed)
Medication Instructions:  Your physician recommends that you continue on your current medications as directed. Please refer to the Current Medication list given to you today.  If you need a refill on your cardiac medications before your next appointment, please call your pharmacy.   Lab work: None ordered If you have labs (blood work) drawn today and your tests are completely normal, you will receive your results only by: . MyChart Message (if you have MyChart) OR . A paper copy in the mail If you have any lab test that is abnormal or we need to change your treatment, we will call you to review the results.  Testing/Procedures: None ordered  Follow-Up: At [facility], you and your health needs are our priority.  As part of our continuing mission to provide you with exceptional heart care, we have created designated Provider Care Teams.  These Care Teams include your primary Cardiologist (physician) and Advanced Practice Providers (APPs -  Physician Assistants and Nurse Practitioners) who all work together to provide you with the care you need, when you need it. You will need a follow up appointment in 6 months.  Please call our office 2 months in advance to schedule this appointment.  You may see Christopher End, MD or one of the following Advanced Practice Providers on your designated Care Team:   Christopher Berge, NP Ryan Dunn, PA-C . Jacquelyn Visser, PA-C     

## 2018-09-02 ENCOUNTER — Encounter: Payer: Self-pay | Admitting: Internal Medicine

## 2018-09-15 ENCOUNTER — Telehealth: Payer: Self-pay | Admitting: Pharmacist

## 2018-09-15 NOTE — Telephone Encounter (Signed)
09/15/2018 3:22:36 PM - Delene Loll renewal to provider & pt  09/15/2018 Per Aldona Bar, I have reprinted [employer] application for Entresto-mailing to patient to sign & return also remailing spanish version of Recert to be completed & return, also sending provider portion to [place] to sign.Delos Haring

## 2018-09-16 ENCOUNTER — Other Ambulatory Visit: Payer: Self-pay | Admitting: Internal Medicine

## 2018-09-16 DIAGNOSIS — I5022 Chronic systolic (congestive) heart failure: Secondary | ICD-10-CM

## 2018-09-16 DIAGNOSIS — I2102 ST elevation (STEMI) myocardial infarction involving left anterior descending coronary artery: Secondary | ICD-10-CM

## 2018-09-16 DIAGNOSIS — I10 Essential (primary) hypertension: Secondary | ICD-10-CM

## 2018-10-14 ENCOUNTER — Telehealth: Payer: Self-pay | Admitting: Pharmacy Technician

## 2018-10-14 NOTE — Telephone Encounter (Signed)
Received 2020 proof of income.  Patient eligible to receive medication assistance at [facility] as long as eligibility requirements continue to be met.  [facility]

## 2018-10-19 IMAGING — CT CT ANGIO CHEST-ABD-PELV FOR DISSECTION W/ AND WO/W CM
2 of 7 series · 13 of 46 positions shown, 15 images · IV contrast (iopamidol)
Comparison: 01/25/2017

CLINICAL DATA: Chest pain for 1 month

EXAM:
CT ANGIOGRAPHY CHEST, ABDOMEN AND PELVIS
TECHNIQUE: Multidetector CT imaging through the chest, abdomen and pelvis was
performed using the standard protocol during bolus administration of
intravenous contrast. Multiplanar reconstructed images and MIPs were
obtained and reviewed to evaluate the vascular anatomy.
CONTRAST:  100mL V2ECTK-7V0 IOPAMIDOL (V2ECTK-7V0) INJECTION 76%

[Series 5: axial arterial · axial · arterial · 0.74mm/px · z∈[-572,-44]mm · 10 of 202 slices shown, 12 images]
[im 13/202  soft-tissue]
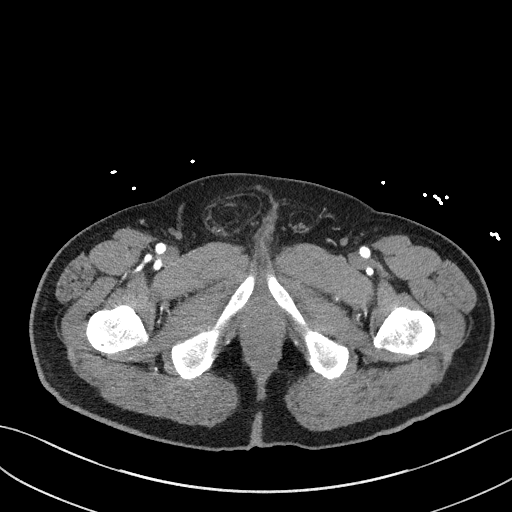
[im 13/202  bone]
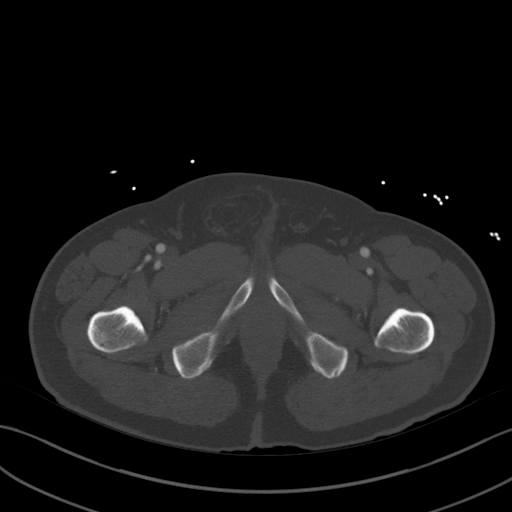
[im 38/202  soft-tissue]
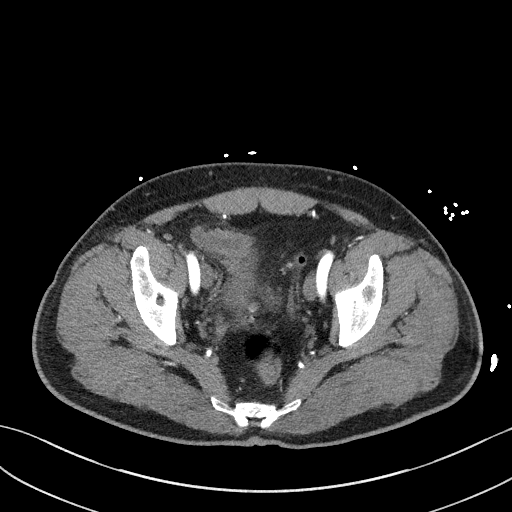
[im 51/202  soft-tissue]
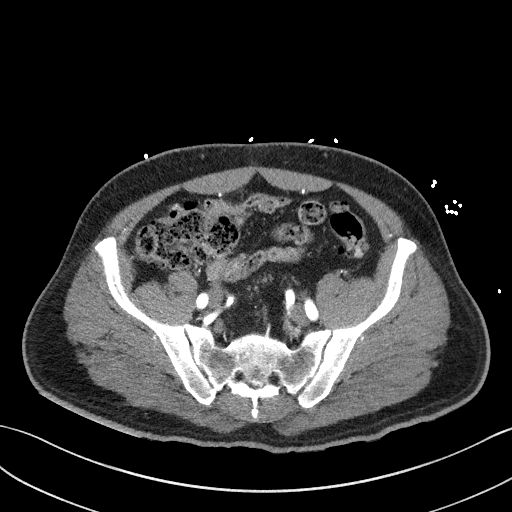
[im 76/202  soft-tissue]
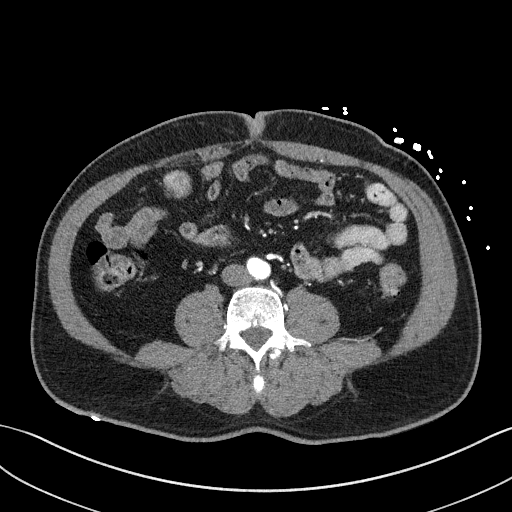
[im 88/202  soft-tissue]
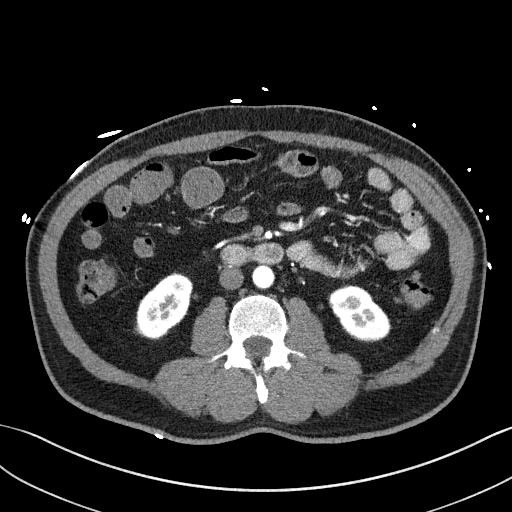
[im 114/202  soft-tissue]
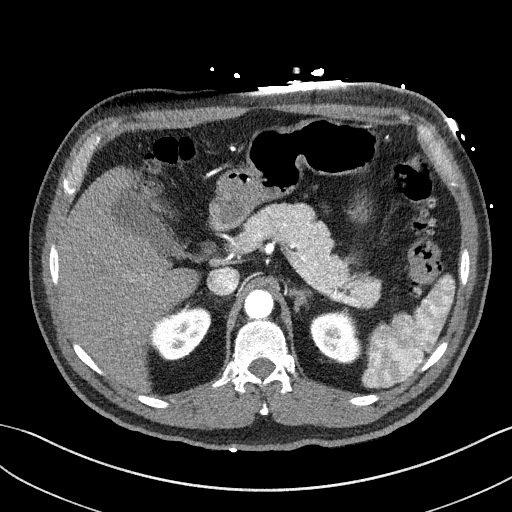
[im 126/202  soft-tissue]
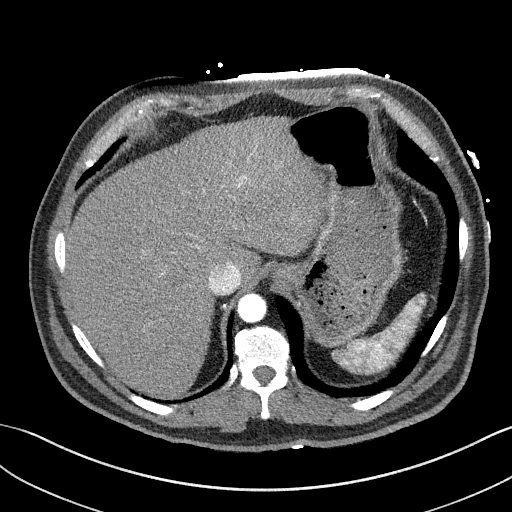
[im 151/202  soft-tissue]
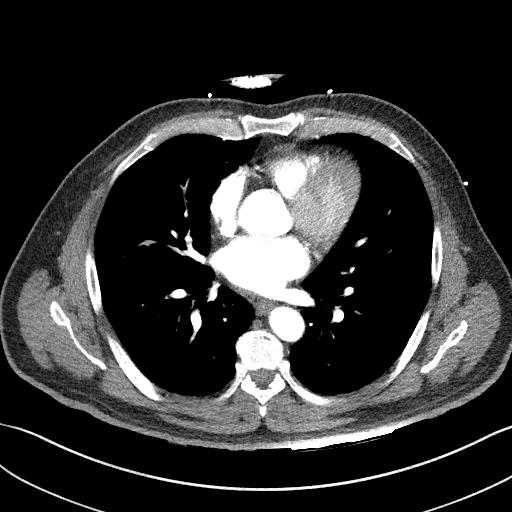
[im 164/202  soft-tissue]
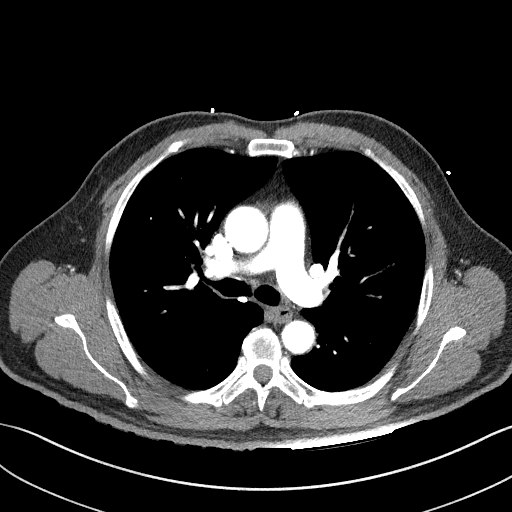
[im 164/202  bone]
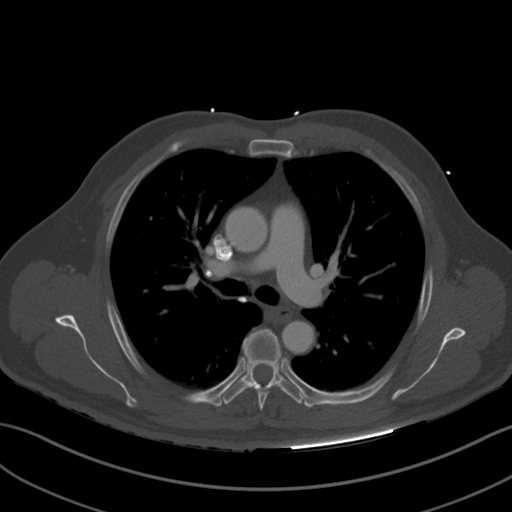
[im 189/202  soft-tissue]
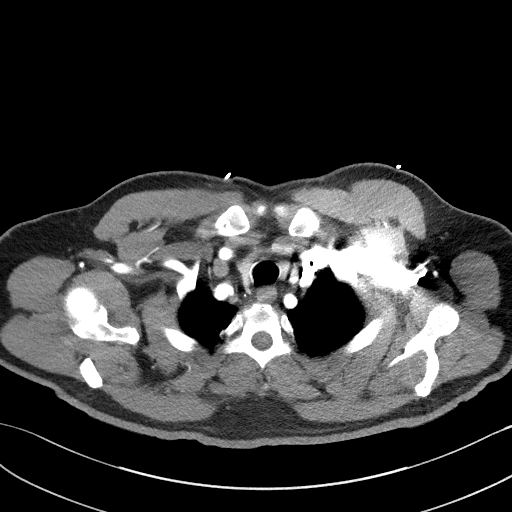

[Series 8: coronals · coronal · 0.73mm/px · 3 of 139 slices shown]
[im 35/139  soft-tissue]
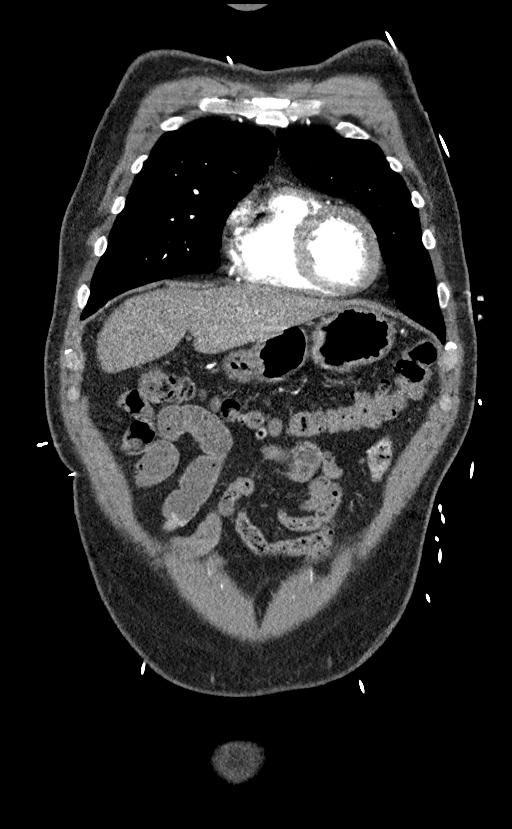
[im 70/139  soft-tissue]
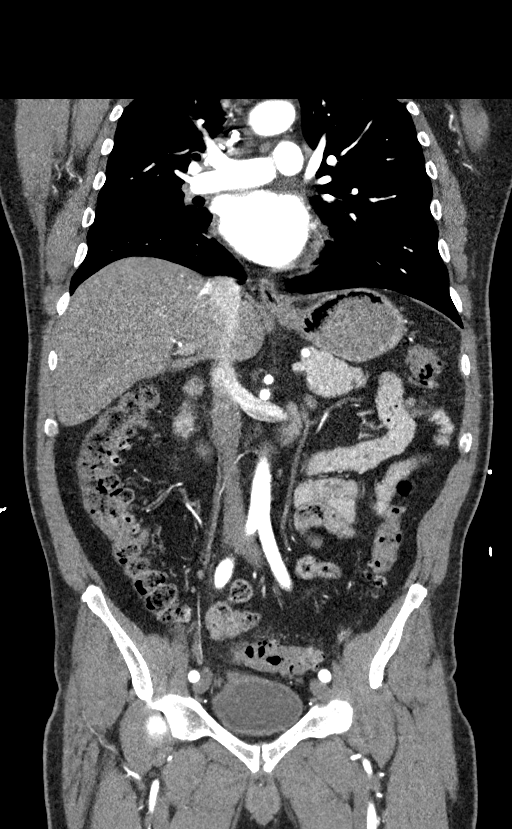
[im 104/139  soft-tissue]
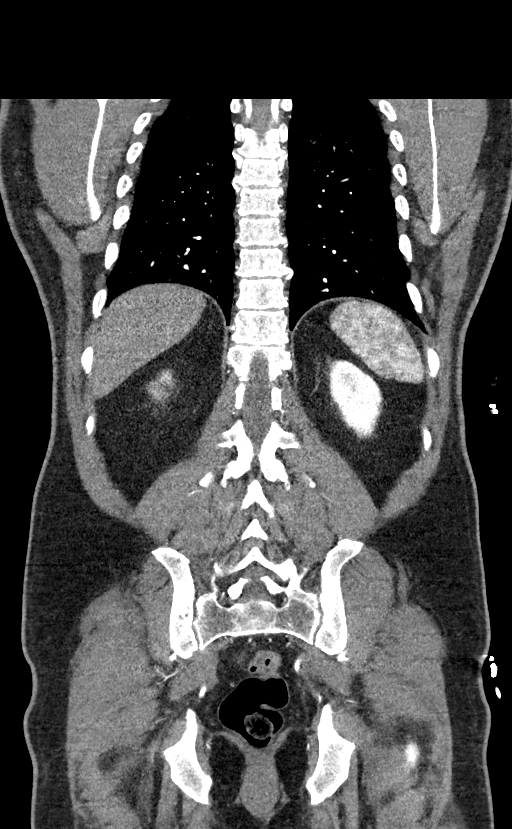

[13 of 46 positions shown; findings below may reference images not displayed]

FINDINGS: CTA CHEST FINDINGS

Cardiovascular: Non contrasted images of the chest demonstrate no
intramural hematoma. nonaneurysmal aorta. No dissection is seen.
Mild coronary artery calcification. Minimal atherosclerotic
calcifications of the aorta. Normal heart size. Trace pericardial
fluid.

Mediastinum/Nodes: Multiple calcified lymph nodes consistent with
prior granulomatous disease. Midline trachea. No thyroid mass.
Esophagus within normal limits.

Lungs/Pleura: Calcified right upper lobe pulmonary nodule, likely a
granuloma given calcified nodes. No pleural effusion or
consolidation. Negative for a pneumothorax

Musculoskeletal: No chest wall abnormality. No acute or significant
osseous findings.

Review of the MIP images confirms the above findings.

CTA ABDOMEN AND PELVIS FINDINGS

VASCULAR

Aorta: Normal caliber aorta without aneurysm, dissection, vasculitis
or significant stenosis.

Celiac: Patent without evidence of aneurysm, dissection, vasculitis
or significant stenosis.

SMA: Patent without evidence of aneurysm, dissection, vasculitis or
significant stenosis.

Renals: Both renal arteries are patent without evidence of aneurysm,
dissection, vasculitis, fibromuscular dysplasia or significant
stenosis.

IMA: Patent without evidence of aneurysm, dissection, vasculitis or
significant stenosis.

Inflow: Patent without evidence of aneurysm, dissection, vasculitis
or significant stenosis.

Veins: No obvious venous abnormality within the limitations of this
arterial phase study.

Review of the MIP images confirms the above findings.

NON-VASCULAR

Hepatobiliary: No focal liver abnormality is seen. No gallstones,
gallbladder wall thickening, or biliary dilatation.

Pancreas: Unremarkable. No pancreatic ductal dilatation or
surrounding inflammatory changes.

Spleen: Heterogeneous, likely due to arterial phase enhancement

Adrenals/Urinary Tract: Adrenal glands are unremarkable. Kidneys are
normal, without renal calculi, focal lesion, or hydronephrosis. Mild
asymmetric thickening of the right anterior bladder wall.

Stomach/Bowel: Stomach is within normal limits. Appendix not well
seen. Diffuse diverticular disease of the colon without acute
inflammation No evidence of bowel wall thickening, distention, or
inflammatory changes.

Lymphatic: No significantly enlarged lymph nodes

Reproductive: Prostate gland is enlarged.

Other: Negative for free air or free fluid. Right greater than left
fat containing inguinal hernia.

Musculoskeletal: No acute or significant osseous findings.

Review of the MIP images confirms the above findings.
IMPRESSION: 1. Negative for acute aortic dissection or aneurysm
2. Clear lung fields.  Evidence of prior granulomatous disease
3. Minimal atherosclerotic calcification and coronary calcification.
4. No CT evidence for acute intra-abdominal or pelvic abnormality
5. Diffuse diverticular disease of the colon without acute
inflammation
6. Mild asymmetric wall thickening of the right anterior bladder,
correlate clinically for any urinary tract symptoms.

## 2018-10-26 ENCOUNTER — Ambulatory Visit: Payer: Self-pay | Admitting: Adult Health Nurse Practitioner

## 2018-10-28 ENCOUNTER — Telehealth: Payer: Self-pay | Admitting: Pharmacist

## 2018-10-28 NOTE — Telephone Encounter (Signed)
10/28/2018 9:10:52 AM - Delene Loll faxed to [employer]  10/28/2018 Faxed [employer] application for [employer] 24/26 Take 1 tablet by mouth two times a day. #180.Delos Haring

## 2018-12-02 ENCOUNTER — Ambulatory Visit: Payer: Self-pay | Admitting: Family Medicine

## 2018-12-02 ENCOUNTER — Other Ambulatory Visit: Payer: Self-pay

## 2018-12-02 VITALS — BP 123/78 | HR 57 | Temp 97.9°F | Ht 60.0 in | Wt 161.0 lb

## 2018-12-02 DIAGNOSIS — I2102 ST elevation (STEMI) myocardial infarction involving left anterior descending coronary artery: Secondary | ICD-10-CM

## 2018-12-02 DIAGNOSIS — R7303 Prediabetes: Secondary | ICD-10-CM

## 2018-12-02 DIAGNOSIS — I5022 Chronic systolic (congestive) heart failure: Secondary | ICD-10-CM

## 2018-12-02 DIAGNOSIS — I10 Essential (primary) hypertension: Secondary | ICD-10-CM

## 2018-12-02 DIAGNOSIS — Z789 Other specified health status: Secondary | ICD-10-CM

## 2018-12-02 MED ORDER — ASPIRIN 81 MG PO CHEW: CHEWABLE_TABLET | ORAL | 0 refills | Status: DC

## 2018-12-02 MED ORDER — CARVEDILOL 6.25 MG PO TABS: 6.2500 mg | ORAL_TABLET | Freq: Two times a day (BID) | ORAL | 0 refills | Status: DC

## 2018-12-02 MED ORDER — SPIRONOLACTONE 25 MG PO TABS: 12.5000 mg | ORAL_TABLET | Freq: Every day | ORAL | 0 refills | Status: DC

## 2018-12-02 MED ORDER — METFORMIN HCL 500 MG PO TABS: ORAL_TABLET | ORAL | 0 refills | Status: DC

## 2018-12-02 NOTE — Patient Instructions (Signed)

## 2018-12-02 NOTE — Progress Notes (Signed)
Established Patient Office Visit  Subjective:  Patient ID: Lucas [name], male    DOB: 1955-05-06  Age: 63 y.o. MRN: [id number]  CC:  Chief Complaint  Patient presents with  . Medication Refill    HPI Lucas [name] presents for refill of medications. Patient reports compliance with all medications.  Patient denies side effects of medications. He denies problems or concerns today.    Past Medical History:  Diagnosis Date  . Acute ST elevation myocardial infarction (STEMI) involving left anterior descending (LAD) coronary artery ([place])    a. 01/2017 Ant STEMI/PCI: LAD 20ost, 100p (3.0x23 Xience Sierra DES).  . CAD (coronary artery disease)    a. 01/2017 Ant STEMI/PCI: LM 20d, LAD 20ost, 100p (3.0x23 Xience Sierra DES), 70m, D1 50ost, LCX nl, RCA 30p; b. 11/2017 MV: Intermediate risk w/ 79mm inf ST dep; c. 11/2017 Cath: LM nl, LAD patent prox stent, D1 20ost, LCX nl, RCA 30p. EF 45-50%.  . Chronic combined systolic (congestive) and diastolic (congestive) heart failure ([place])    a. 01/2018 Echo: EF 30-35%, sev antsept, ant, apical HK. Gr1 DD, mild MR.  . Essential hypertension   . Hyperlipidemia    a. Myalgias with high dose lipitor - tolerating 40mg  daily.  . Ischemic cardiomyopathy    a. 01/2018 Echo: EF 30-35%.  . Type II diabetes mellitus ([place])     Past Surgical History:  Procedure Laterality Date  . APPENDECTOMY    . CARDIAC CATHETERIZATION    . CORONARY ANGIOPLASTY    . CORONARY/GRAFT ACUTE MI REVASCULARIZATION N/A 01/25/2017   Procedure: Coronary/Graft Acute MI Revascularization;  Surgeon: Nelva Bush, MD;  Location: [facility];  Service: Cardiovascular;  Laterality: N/A;  . LEFT HEART CATH AND CORONARY ANGIOGRAPHY N/A 01/25/2017   Procedure: LEFT HEART CATH AND CORONARY ANGIOGRAPHY;  Surgeon: Nelva Bush, MD;  Location: [facility];  Service: Cardiovascular;  Laterality: N/A;  . LEFT HEART CATH AND CORONARY ANGIOGRAPHY N/A  11/20/2017   Procedure: LEFT HEART CATH AND CORONARY ANGIOGRAPHY;  Surgeon: Wellington Hampshire, MD;  Location: [facility];  Service: Cardiovascular;  Laterality: N/A;    Family History  Problem Relation Age of Onset  . Heart disease Father        s/p pacemaker  . Arrhythmia Father   . Asthma Father   . Asthma Mother     Social History   Socioeconomic History  . Marital status: Married    Spouse name: Not on file  . Number of children: Not on file  . Years of education: Not on file  . Highest education level: Not on file  Occupational History  . Not on file  Social Needs  . Financial resource strain: Hard  . Food insecurity    Worry: Never true    Inability: Never true  . Transportation needs    Medical: Not on file    Non-medical: Not on file  Tobacco Use  . Smoking status: Never Smoker  . Smokeless tobacco: Never Used  Substance and Sexual Activity  . Alcohol use: No    Frequency: Never  . Drug use: No  . Sexual activity: Yes    Birth control/protection: Coitus interruptus, None  Lifestyle  . Physical activity    Days per week: 7 days    Minutes per session: 30 min  . Stress: Patient refused  Relationships  . Social connections    Talks on phone: Patient refused    Gets together: Patient refused  Attends religious service: Patient refused    Active member of club or organization: Patient refused    Attends meetings of clubs or organizations: Patient refused    Relationship status: Patient refused  . Intimate partner violence    Fear of current or ex partner: Not on file    Emotionally abused: Not on file    Physically abused: Not on file    Forced sexual activity: Not on file  Other Topics Concern  . Not on file  Social History Narrative  . Not on file    Outpatient Medications Prior to Visit  Medication Sig Dispense Refill  . clopidogrel (PLAVIX) 75 MG tablet Take 1 tablet (75 mg total) by mouth daily. 90 tablet 2  . sacubitril-valsartan  (ENTRESTO) 24-26 MG Take 1 tablet by mouth 2 (two) times daily. 180 tablet 3  . aspirin 81 MG chewable tablet CHEW ONE TABLET BY MOUTH EVERY DAY 90 tablet 0  . carvedilol (COREG) 6.25 MG tablet Take 1 tablet (6.25 mg total) by mouth 2 (two) times daily. 180 tablet 3  . metFORMIN (GLUCOPHAGE) 500 MG tablet TAKE ONE TABLET BY MOUTH EVERY DAY WITH BREAKFAST 90 tablet 0  . spironolactone (ALDACTONE) 25 MG tablet Take 0.5 tablets (12.5 mg total) by mouth daily. 90 tablet 2  . atorvastatin (LIPITOR) 20 MG tablet Take 1 tablet (20 mg total) by mouth daily. (Patient taking differently: Take 10 mg by mouth daily. ) 90 tablet 3  . nitroGLYCERIN (NITROSTAT) 0.4 MG SL tablet Place 1 tablet (0.4 mg total) under the tongue every 5 (five) minutes as needed for chest pain. Maximum of 3 doses. 90 tablet 3   No facility-administered medications prior to visit.     No Known Allergies  ROS Review of Systems  Constitutional: Negative.   Respiratory: Negative.   Cardiovascular: Negative.   Psychiatric/Behavioral: Negative.       Objective:    Physical Exam  Constitutional: He is oriented to person, place, and time. He appears well-developed and well-nourished. No distress.  HENT:  Head: Normocephalic and atraumatic.  Eyes: Pupils are equal, round, and reactive to light. Conjunctivae and EOM are normal.  Neck: Normal range of motion.  Cardiovascular: Normal rate, regular rhythm and normal heart sounds.  Pulmonary/Chest: Effort normal and breath sounds normal. No respiratory distress.  Musculoskeletal: Normal range of motion.  Neurological: He is alert and oriented to person, place, and time.  Skin: Skin is warm and dry.  Psychiatric: He has a normal mood and affect. His behavior is normal. Judgment and thought content normal.  Nursing note and vitals reviewed.   BP 123/78   Pulse (!) 57   Temp 97.9 F (36.6 C)   Ht 5' (1.524 m)   Wt 161 lb (73 kg)   SpO2 98%   BMI 31.44 kg/m  Wt Readings  from Last 3 Encounters:  12/02/18 161 lb (73 kg)  09/01/18 148 lb (67.1 kg)  04/24/18 147 lb 9.6 oz (67 kg)     Health Maintenance Due  Topic Date Due  . Hepatitis C Screening  06/28/1955  . PNEUMOCOCCAL POLYSACCHARIDE VACCINE AGE 36-64 HIGH RISK  11/17/1959  . FOOT EXAM  11/17/1967  . OPHTHALMOLOGY EXAM  11/17/1967  . URINE MICROALBUMIN  11/17/1967  . TETANUS/TDAP  11/16/1976  . COLONOSCOPY  11/17/2007  . INFLUENZA VACCINE  10/02/2018    There are no preventive care reminders to display for this patient.  Lab Results  Component Value Date   TSH 3.160  08/19/2018   Lab Results  Component Value Date   WBC 7.2 08/19/2018   HGB 15.5 08/19/2018   HCT 45.7 08/19/2018   MCV 95 08/19/2018   PLT 209 08/19/2018   Lab Results  Component Value Date   NA 137 08/19/2018   K 4.1 08/19/2018   CO2 24 08/19/2018   GLUCOSE 156 (H) 08/19/2018   BUN 19 08/19/2018   CREATININE 0.87 08/19/2018   BILITOT 0.4 08/19/2018   ALKPHOS 57 08/19/2018   AST 14 08/19/2018   ALT 17 08/19/2018   PROT 7.0 08/19/2018   ALBUMIN 4.5 08/19/2018   CALCIUM 9.0 08/19/2018   ANIONGAP 9 11/20/2017   Lab Results  Component Value Date   CHOL 96 (L) 08/19/2018   Lab Results  Component Value Date   HDL 31 (L) 08/19/2018   Lab Results  Component Value Date   LDLCALC 29 08/19/2018   Lab Results  Component Value Date   TRIG 180 (H) 08/19/2018   Lab Results  Component Value Date   CHOLHDL 3.1 08/19/2018   Lab Results  Component Value Date   HGBA1C 5.9 (H) 08/19/2018      Assessment & Plan:   Problem List Items Addressed This Visit      Cardiovascular and Mediastinum   Chronic systolic heart failure (HCC) (Chronic)   Relevant Medications   carvedilol (COREG) 6.25 MG tablet   aspirin 81 MG chewable tablet   spironolactone (ALDACTONE) 25 MG tablet   STEMI involving left anterior descending coronary artery (HCC)   Relevant Medications   carvedilol (COREG) 6.25 MG tablet   aspirin 81 MG  chewable tablet   spironolactone (ALDACTONE) 25 MG tablet   HTN (hypertension)   Relevant Medications   carvedilol (COREG) 6.25 MG tablet   aspirin 81 MG chewable tablet   spironolactone (ALDACTONE) 25 MG tablet     Other   Prediabetes   Relevant Medications   metFORMIN (GLUCOPHAGE) 500 MG tablet      Meds ordered this encounter  Medications  . carvedilol (COREG) 6.25 MG tablet    Sig: Take 1 tablet (6.25 mg total) by mouth 2 (two) times daily.    Dispense:  180 tablet    Refill:  0  . aspirin 81 MG chewable tablet    Sig: Take one pill by mouth every day.    Dispense:  90 tablet    Refill:  0    .  . metFORMIN (GLUCOPHAGE) 500 MG tablet    Sig: TAKE ONE TABLET BY MOUTH EVERY DAY WITH BREAKFAST    Dispense:  90 tablet    Refill:  0  . spironolactone (ALDACTONE) 25 MG tablet    Sig: Take 0.5 tablets (12.5 mg total) by mouth daily.    Dispense:  90 tablet    Refill:  0   Interpreter services utilized throughout the encounter.   Follow-up: Return in about 3 months (around 03/04/2019) for DM and labs.    Lanae Boast, FNP

## 2018-12-06 ENCOUNTER — Ambulatory Visit: Payer: Self-pay

## 2018-12-07 ENCOUNTER — Other Ambulatory Visit: Payer: Self-pay

## 2018-12-07 NOTE — Progress Notes (Cosign Needed Addendum)
[facility] Visit Note  Patient: Lucas [name] MRN: [id number] Date of Birth: 09/14/1955 PCP: [name], MD   Lucas [name] 63 y.o. male presents for a medication therapy management visit today.  There were no vitals taken for this visit.  Patient Information   Past Medical History:  Diagnosis Date  . Acute ST elevation myocardial infarction (STEMI) involving left anterior descending (LAD) coronary artery ([place])    a. 01/2017 Ant STEMI/PCI: LAD 20ost, 100p (3.0x23 Xience Sierra DES).  . CAD (coronary artery disease)    a. 01/2017 Ant STEMI/PCI: LM 20d, LAD 20ost, 100p (3.0x23 Xience Sierra DES), 58m, D1 50ost, LCX nl, RCA 30p; b. 11/2017 MV: Intermediate risk w/ 23mm inf ST dep; c. 11/2017 Cath: LM nl, LAD patent prox stent, D1 20ost, LCX nl, RCA 30p. EF 45-50%.  . Chronic combined systolic (congestive) and diastolic (congestive) heart failure ([place])    a. 01/2018 Echo: EF 30-35%, sev antsept, ant, apical HK. Gr1 DD, mild MR.  . Essential hypertension   . Hyperlipidemia    a. Myalgias with high dose lipitor - tolerating 40mg  daily.  . Ischemic cardiomyopathy    a. 01/2018 Echo: EF 30-35%.  . Type II diabetes mellitus ([place])       Past Surgical History:  Procedure Laterality Date  . APPENDECTOMY    . CARDIAC CATHETERIZATION    . CORONARY ANGIOPLASTY    . CORONARY/GRAFT ACUTE MI REVASCULARIZATION N/A 01/25/2017   Procedure: Coronary/Graft Acute MI Revascularization;  Surgeon: Nelva Bush, MD;  Location: [facility];  Service: Cardiovascular;  Laterality: N/A;  . LEFT HEART CATH AND CORONARY ANGIOGRAPHY N/A 01/25/2017   Procedure: LEFT HEART CATH AND CORONARY ANGIOGRAPHY;  Surgeon: Nelva Bush, MD;  Location: [facility];  Service: Cardiovascular;  Laterality: N/A;  . LEFT HEART CATH AND CORONARY ANGIOGRAPHY N/A 11/20/2017   Procedure: LEFT HEART CATH AND CORONARY ANGIOGRAPHY;  Surgeon: Wellington Hampshire, MD;   Location: [facility];  Service: Cardiovascular;  Laterality: N/A;     Family History  Problem Relation Age of Onset  . Heart disease Father        s/p pacemaker  . Arrhythmia Father   . Asthma Father   . Asthma Mother     New Diagnoses (since last visit): none  Family Support: Good  Lifestyle Diet: Breakfast; beans and chicken  Lunch: same as breakfast, fruit (apple) Dinner: fruit, sandwich (chicken, ham) Drinks: water Snacks: sugar free cookies       Social History   Substance and Sexual Activity  Alcohol Use No  . Frequency: Never    Social History   Tobacco Use  Smoking Status Never Smoker  Smokeless Tobacco Never Used     Health Maintenance  Topic Date Due  . Hepatitis C Screening  1955/04/19  . PNEUMOCOCCAL POLYSACCHARIDE VACCINE AGE 34-64 HIGH RISK  11/17/1959  . FOOT EXAM  11/17/1967  . OPHTHALMOLOGY EXAM  11/17/1967  . URINE MICROALBUMIN  11/17/1967  . TETANUS/TDAP  11/16/1976  . COLONOSCOPY  11/17/2007  . INFLUENZA VACCINE  10/02/2018  . HEMOGLOBIN A1C  02/18/2019  . HIV Screening  Completed   Health Maintenance/Date Completed  Last ED visit: no recent Last Visit to PCP: unknown  Next Visit to PCP: No scheduled appointment Dental Exam: no Eye Exam: no Prostate Exam: no DEXA: no Colonoscopy: no Flu Vaccine: planning to get this month 12/2018 Pneumonia Vaccine: no  Outpatient Encounter Medications as of 12/06/2018  Medication Sig  .  aspirin 81 MG chewable tablet Take one pill by mouth every day.  Marland Kitchen atorvastatin (LIPITOR) 20 MG tablet Take 1 tablet (20 mg total) by mouth daily. (Patient taking differently: Take 10 mg by mouth daily. )  . carvedilol (COREG) 6.25 MG tablet Take 1 tablet (6.25 mg total) by mouth 2 (two) times daily.  . clopidogrel (PLAVIX) 75 MG tablet Take 1 tablet (75 mg total) by mouth daily.  . metFORMIN (GLUCOPHAGE) 500 MG tablet TAKE ONE TABLET BY MOUTH EVERY DAY WITH BREAKFAST  . sacubitril-valsartan (ENTRESTO)  24-26 MG Take 1 tablet by mouth 2 (two) times daily.  Marland Kitchen spironolactone (ALDACTONE) 25 MG tablet Take 0.5 tablets (12.5 mg total) by mouth daily.  . nitroGLYCERIN (NITROSTAT) 0.4 MG SL tablet Place 1 tablet (0.4 mg total) under the tongue every 5 (five) minutes as needed for chest pain. Maximum of 3 doses.   No facility-administered encounter medications on file as of 12/06/2018.     Assessment: Heart failure: Patient denies having to use the nitroglycerin tablets for chest pain.  Patient denies chest pain and shortness of breath.    Hypertension: Patient does not check blood pressure at home. Patient stated he has been feeling fine.  Prediabetes: metformin Patient doesn't check blood sugar at home. Patient stated he takes medication according to the doctors orders.   Hyperlipidemia: atorvastatin  Patient stated doctor checked the directions to  tablet by mouth daily. Patient denies any side effects to atorvastatin.   Adherence:  Patient denies missing doses of medications. Patient is aware of the benefits of his medications. He stated he knows how important it is to take all his medications as the doctor has prescribed.     Plan: Heart failure: Notify doctor or seek medical attention if you must use nitroglycerin frequently. Continue medications.  Hypertension: Encouraged patient to limit salt intake. Continue medications. Refilled medication.  Prediabetes: Encouraged patient to limit sugar and carbohydrates. Continue metformin.  Hyperlipidemia:  Continue atorvastatin.   Adherence: Continue to take medications according to doctor order.    Chaney Born, PharmD [facility]

## 2018-12-28 ENCOUNTER — Ambulatory Visit: Payer: Self-pay

## 2019-03-01 ENCOUNTER — Telehealth: Payer: Self-pay | Admitting: Internal Medicine

## 2019-03-01 NOTE — Telephone Encounter (Signed)
Called Pt left a message to call office and set up appointment

## 2019-03-02 ENCOUNTER — Other Ambulatory Visit: Payer: Self-pay | Admitting: Cardiovascular Disease

## 2019-03-02 ENCOUNTER — Other Ambulatory Visit: Payer: Self-pay | Admitting: Gerontology

## 2019-03-10 ENCOUNTER — Other Ambulatory Visit: Payer: Self-pay | Admitting: Gerontology

## 2019-03-10 DIAGNOSIS — R7303 Prediabetes: Secondary | ICD-10-CM

## 2019-03-29 ENCOUNTER — Ambulatory Visit: Payer: Self-pay

## 2019-03-31 ENCOUNTER — Encounter: Payer: Self-pay | Admitting: Family Medicine

## 2019-03-31 ENCOUNTER — Ambulatory Visit: Payer: Self-pay | Admitting: Family Medicine

## 2019-03-31 DIAGNOSIS — I5022 Chronic systolic (congestive) heart failure: Secondary | ICD-10-CM

## 2019-03-31 DIAGNOSIS — R7303 Prediabetes: Secondary | ICD-10-CM

## 2019-03-31 DIAGNOSIS — I2102 ST elevation (STEMI) myocardial infarction involving left anterior descending coronary artery: Secondary | ICD-10-CM

## 2019-03-31 MED ORDER — SPIRONOLACTONE 25 MG PO TABS: 12.5000 mg | ORAL_TABLET | Freq: Every day | ORAL | 0 refills | Status: DC

## 2019-03-31 MED ORDER — CARVEDILOL 6.25 MG PO TABS: 6.2500 mg | ORAL_TABLET | Freq: Two times a day (BID) | ORAL | 0 refills | Status: DC

## 2019-03-31 MED ORDER — CLOPIDOGREL BISULFATE 75 MG PO TABS: 75.0000 mg | ORAL_TABLET | Freq: Every day | ORAL | 0 refills | Status: DC

## 2019-03-31 MED ORDER — ATORVASTATIN CALCIUM 20 MG PO TABS: 10.0000 mg | ORAL_TABLET | Freq: Every day | ORAL | 0 refills | Status: DC

## 2019-03-31 MED ORDER — METFORMIN HCL 500 MG PO TABS: 500.0000 mg | ORAL_TABLET | Freq: Every day | ORAL | 0 refills | Status: DC

## 2019-03-31 MED ORDER — SACUBITRIL-VALSARTAN 24-26 MG PO TABS: 1.0000 | ORAL_TABLET | Freq: Two times a day (BID) | ORAL | 0 refills | Status: DC

## 2019-03-31 NOTE — Progress Notes (Signed)
   Virtual Visit via Telephone Note  I connected with Lucas [name] on 03/31/19 at  6:00 PM EST by telephone and verified that I am speaking with the correct person using two identifiers.   I discussed the limitations, risks, security and privacy concerns of performing an evaluation and management service by telephone and the availability of in person appointments. I also discussed with the patient that there may be a patient responsible charge related to this service. The patient expressed understanding and agreed to proceed.   History of Present Illness: Lucas [name]  has a past medical history of Acute ST elevation myocardial infarction (STEMI) involving left anterior descending (LAD) coronary artery (HCC), CAD (coronary artery disease), Chronic combined systolic (congestive) and diastolic (congestive) heart failure ([place]), Essential hypertension, Hyperlipidemia, Ischemic cardiomyopathy, and Type II diabetes mellitus ([place]).   Patient states that he is doing well on medications and reports compliance. He does not check his FBS at home. He denies hypoglycemic episodes. He denies CP, SOB, dizziness and leg swelling. He is requesting refills of medications.  No problems or concerns today.  Observations/Objective: Patient with regular voice tone, rate and rhythm. Speaking calmly and is in no apparent distress.    Assessment and Plan: 1. STEMI involving left anterior descending coronary artery (HCC) - atorvastatin (LIPITOR) 20 MG tablet; Take 0.5 tablets (10 mg total) by mouth daily.  Dispense: 45 tablet; Refill: 0 - clopidogrel (PLAVIX) 75 MG tablet; Take 1 tablet (75 mg total) by mouth daily.  Dispense: 90 tablet; Refill: 0 - spironolactone (ALDACTONE) 25 MG tablet; Take 0.5 tablets (12.5 mg total) by mouth daily.  Dispense: 45 tablet; Refill: 0 - Lipid Panel With LDL/HDL Ratio; Future  2. Chronic systolic heart failure (HCC) - spironolactone (ALDACTONE) 25 MG tablet;  Take 0.5 tablets (12.5 mg total) by mouth daily.  Dispense: 45 tablet; Refill: 0 - sacubitril-valsartan (ENTRESTO) 24-26 MG; Take 1 tablet by mouth 2 (two) times daily.  Dispense: 180 tablet; Refill: 0 - carvedilol (COREG) 6.25 MG tablet; Take 1 tablet (6.25 mg total) by mouth 2 (two) times daily with a meal.  Dispense: 180 tablet; Refill: 0 - Comprehensive metabolic panel; Future - Lipid Panel With LDL/HDL Ratio; Future  3. Prediabetes - metFORMIN (GLUCOPHAGE) 500 MG tablet; Take 1 tablet (500 mg total) by mouth daily with breakfast.  Dispense: 90 tablet; Refill: 0 - Lipid Panel With LDL/HDL Ratio; Future - Hemoglobin A1c; Future  Interpreter services utilized throughout the encounter.    Follow Up Instructions:  We discussed hand washing, using hand sanitizer when soap and water are not available, only going out when absolutely necessary, and social distancing. Explained to patient that he is immunocompromised and will need to take precautions during this time.   I discussed the assessment and treatment plan with the patient. The patient was provided an opportunity to ask questions and all were answered. The patient agreed with the plan and demonstrated an understanding of the instructions.   The patient was advised to call back or seek an in-person evaluation if the symptoms worsen or if the condition fails to improve as anticipated.  I provided 20 minutes of non-face-to-face time during this encounter.  Ms. Andr L. Nathaneil Canary, FNP-BC [facility] [address] [place], [place] [address] [phone]

## 2019-05-05 ENCOUNTER — Other Ambulatory Visit: Payer: Self-pay

## 2019-05-11 ENCOUNTER — Encounter: Payer: Self-pay | Admitting: Gerontology

## 2019-05-11 ENCOUNTER — Other Ambulatory Visit: Payer: Self-pay

## 2019-05-11 ENCOUNTER — Ambulatory Visit: Payer: Self-pay | Admitting: Gerontology

## 2019-05-11 VITALS — BP 129/83 | HR 71 | Temp 98.1°F | Ht 60.0 in | Wt 149.7 lb

## 2019-05-11 DIAGNOSIS — R7303 Prediabetes: Secondary | ICD-10-CM

## 2019-05-11 DIAGNOSIS — I5022 Chronic systolic (congestive) heart failure: Secondary | ICD-10-CM

## 2019-05-11 DIAGNOSIS — I2102 ST elevation (STEMI) myocardial infarction involving left anterior descending coronary artery: Secondary | ICD-10-CM

## 2019-05-11 DIAGNOSIS — I10 Essential (primary) hypertension: Secondary | ICD-10-CM

## 2019-05-11 MED ORDER — ATORVASTATIN CALCIUM 20 MG PO TABS: 10.0000 mg | ORAL_TABLET | Freq: Every day | ORAL | 0 refills | Status: DC

## 2019-05-11 MED ORDER — SPIRONOLACTONE 25 MG PO TABS: 12.5000 mg | ORAL_TABLET | Freq: Every day | ORAL | 0 refills | Status: DC

## 2019-05-11 MED ORDER — ASPIRIN 81 MG PO CHEW: CHEWABLE_TABLET | ORAL | 0 refills | Status: DC

## 2019-05-11 MED ORDER — CLOPIDOGREL BISULFATE 75 MG PO TABS: 75.0000 mg | ORAL_TABLET | Freq: Every day | ORAL | 0 refills | Status: DC

## 2019-05-11 MED ORDER — METFORMIN HCL 500 MG PO TABS: 500.0000 mg | ORAL_TABLET | Freq: Every day | ORAL | 0 refills | Status: DC

## 2019-05-11 NOTE — Patient Instructions (Signed)
Diabetes mellitus y nutricin, en adultos Diabetes Mellitus and Nutrition, Adult Si sufre de diabetes (diabetes mellitus), es muy importante tener hbitos alimenticios saludables debido a que sus niveles de Designer, television/film set sangre (glucosa) se ven afectados en gran medida por lo que come y bebe. [place] alimentos saludables en las cantidades [place], aproximadamente a la [employer], [place] ayudar a:  Aeronautical engineer glucemia.  Disminuir el riesgo de sufrir una enfermedad cardaca.  Mejorar la presin arterial.  Science writer o mantener un peso saludable. Todas las personas que sufren de diabetes son diferentes y cada una tiene necesidades diferentes en cuanto a un plan de alimentacin. El mdico puede recomendarle que trabaje con un especialista en dietas y nutricin (nutricionista) para Financial trader plan para usted. Su plan de alimentacin puede variar segn factores como:  Las caloras que necesita.  Los medicamentos que toma.  Su peso.  Sus niveles de glucemia, presin arterial y colesterol.  Su nivel de [place].  Otras afecciones que tenga, como enfermedades cardacas o renales. Cmo me afectan los carbohidratos? Los carbohidratos, o hidratos de carbono, afectan su nivel de glucemia ms que cualquier otro tipo de alimento. La ingesta de carbohidratos naturalmente aumenta la cantidad de [employer]. El recuento de carbohidratos es un mtodo destinado a Catering manager un registro de la cantidad de carbohidratos que se consumen. El recuento de carbohidratos es importante para Theatre manager la glucemia a un nivel saludable, especialmente si utiliza insulina o toma determinados medicamentos por va oral para la diabetes. Es importante conocer la cantidad de carbohidratos que se pueden ingerir en cada comida sin correr Engineer, manufacturing. Esto es Psychologist, forensic. Su nutricionista puede ayudarlo a calcular la cantidad de carbohidratos que debe ingerir en cada comida y en cada  refrigerio. Entre los alimentos que contienen carbohidratos, se incluyen:  Pan, cereal, arroz, pastas y galletas.  Papas y maz.  Guisantes, frijoles y lentejas.  Leche y Estate agent.  Lambert Mody y [place].  Postres, como pasteles, galletas, helado y caramelos. Cmo me afecta el alcohol? El alcohol puede provocar disminuciones sbitas de la glucemia (hipoglucemia), especialmente si utiliza insulina o toma determinados medicamentos por va oral para la diabetes. La hipoglucemia es una afeccin potencialmente mortal. Los sntomas de la hipoglucemia (somnolencia, mareos y confusin) son similares a los sntomas de haber consumido demasiado alcohol. Si el mdico afirma que el alcohol es seguro para usted, [place] estas pautas:  Limite el consumo de alcohol a no ms de 26mdida por da si es mujer y no est [place] y a 219midas si es hombre. Una medida equivale a 12oz (35564mde cerveza, 5oz (148m60me vino o 1oz (44ml75m bebidas alcohlicas de alta graduacin.  No beba con el estmago vaco.  Mantngase hidratado bebiendo agua, refrescos dietticos o t helado sin azcar.  Tenga en cuenta que los refrescos comunes, los jugos y otras bebida para mezclOptician, dispensingen contener mucha azcar y se deben contar como carbohidratos. Cules son algunos consejos para seguir este plan?  Leer las etiquetas de los alimentos  Comience por leer el tamao de la porcin en la "Informacin nutricional" en las etiquetas de los alimentos envasados y las bebidas. La cantidad de caloras, carbohidratos, grasas y otros nutrientes mencionados en la etiqueta se basan en una porcin del alimento. Muchos alimentos contienen ms de una porcin por envase.  Verifique la cantidad total de gramos (g) de carbohidratos totales en una porcin. Puede calcular la cantidad de porciones de carbohidratos al dividir el  total de carbohidratos por 15. Por ejemplo, si un alimento tiene un total de 30g de carbohidratos, equivale a 2  porciones de carbohidratos.  Verifique la cantidad de gramos (g) de grasas saturadas y grasas trans en una porcin. Escoja alimentos que no contengan grasa o que tengan un bajo contenido.  Verifique la cantidad de miligramos (mg) de sal (sodio) en una porcin. La [employer] de las personas deben limitar la ingesta de sodio total a menos de 2325m por dTraining and development officer  Siempre consulte la informacin nutricional de los alimentos etiquetados como "con bajo contenido de grasa" o "sin grasa". Estos alimentos pueden tener un mayor contenido de aLocation manageragregada o carbohidratos refinados, y deben evitarse.  Hable con su nutricionista para identificar sus objetivos diarios en cuanto a los nutrientes mencionados en la etiqueta. Al ir de compras  Evite comprar alimentos procesados, enlatados o precocinados. Estos alimentos tienden a tSpecial educational needs teachermayor cantidad de gGerlach sodio y azcar agregada.  Compre en la zona exterior de la tienda de comestibles. Esta zona incluye frutas y verduras frescas, granos a granel, carnes frescas y productos lcteos frescos. Al cocinar  Utilice mtodos de coccin a baja temperatura, como hornear, en lugar de mtodos de coccin a alta temperatura, como frer en abundante aceite.  Cocine con aceites saludables, como el aceite de oPeebles canola o gMilton  Evite cocinar con manteca, crema o carnes con alto contenido de grasa. Planificacin de las comidas  Coma las comidas y los refrigerios regularmente, preferentemente a la misma hora todos lMorrilton Evite pasar largos perodos de tiempo sin comer.  Consuma alimentos ricos en fibra, como frutas frescas, verduras, frijoles y cereales integrales. Consulte a su nutricionista sobre cuntas porciones de carbohidratos puede consumir en cada comida.  Consuma entre 4 y 6 onzas (oz) de protenas magras por da, como carnes mEllsworth pollo, pescado, huevos o tofu. Una onza de protena magra equivale a: ? 1 onza de carne, pollo o  pescado. ? 1huevo. ?  taza de tofu.  Coma algunos alimentos por da que contengan grasas saludables, como aguacates, frutos secos, semillas y pescado. Estilo de vida  Controle su nivel de glucemia con regularidad.  Haga actividad fsica habitualmente como se lo haya indicado el mdico. Esto puede incluir lo siguiente: ? 1551mutos semanales de ejercicio de intensidad moderada o alta. Esto podra incluir caminatas dinmicas, ciclismo o gimnasia acutica. ? Realizar ejercicios de elongacin y de fortalecimiento, como yoga o levantamiento de pesas, por lo menos 2veces por semana.  Tome los meTenneco Ince lo haya indicado el mdico.  No consuma ningn producto que contenga nicotina o tabaco, como cigarrillos y ciPsychologist, sport and exerciseSi necesita ayuda para dejar de fumar, consulte al mdHess Corporationon un asesor o instructor en diabetes para identificar estrategias para controlar el estrs y cualquier desafo emocional y social. Preguntas para hacerle al mdico  Es necesario que consulte a unRadio broadcast assistantn el cuidado de la diabetes?  Es necesario que me rena con un nutricionista?  A qu nmero puedo llamar si tengo preguntas?  Cules son los mejores momentos para controlar la glucemia? Dnde encontrar ms informacin:  Asociacin Estadounidense de la Diabetes (American Diabetes Association): diabetes.org  Academia de Nutricin y DiInformation systems managerAcademy of Nutrition and Dietetics): www.eatright.orAieaiabetes y las Enfermedades Digestivas y Ren[place] (NSt John'S Episcopal Hospital [facility] Shoref Diabetes and Digestive and Kidney Diseases, NIH): wwDesMoinesFuneral.dkesumen  Un plan de alimentacin saludable lo ayudar a coAeronautical engineerlucemia y maTheatre managern estilo de vida saludable.  Trabajar con un especialista en dietas y nutricin (nutricionista) puede ayudarlo a Insurance claims handler de alimentacin para usted.  Tenga en cuenta que los carbohidratos (hidratos de  carbono) y el alcohol tienen efectos inmediatos en sus niveles de glucemia. Es importante contar los carbohidratos que ingiere y consumir alcohol con prudencia. Esta informacin no tiene Marine scientist el consejo del mdico. Asegrese de hacerle al mdico cualquier pregunta que tenga. Document Revised: 10/28/2016 Document Reviewed: 06/09/2016 Elsevier Patient Education  2020 [place] de alimentacin para la insuficiencia cardaca Heart Failure Eating Plan La insuficiencia cardaca, tambin llamada insuficiencia cardaca congestiva, ocurre cuando el corazn no bombea la sangre lo suficientemente bien como para Engineer, water la necesidad del organismo de sangre rica en oxgeno. La insuficiencia cardaca es una enfermedad de larga duracin (crnica). Vivir con insuficiencia cardaca puede ser difcil. Sin embargo, seguir las instrucciones del mdico acerca de Theatre manager un estilo de vida saludable y trabajar con un especialista en alimentacin y nutricin (nutricionista) para elegir los alimentos adecuados puede mejorar los sntomas. Cules son algunos consejos para seguir este plan? Pautas generales  No consuma ms de 2,300 mg de sal (sodio) por da. La cantidad de [employer] Inc recomienden puede ser ms baja, segn su afeccin.  Mantenga un peso corporal saludable, segn las indicaciones. Pregntele al mdico cul es un peso saludable para usted. ? Controle su [place]. ? Trabaje con el mdico y el nutricionista para elaborar un plan que sea adecuado para usted con el objetivo de Horticulturist, commercial o Theatre manager su peso actual.  Limite la cantidad de lquido que bebe. Pregntele al mdico o al nutricionista cunto lquido puede beber por Training and development officer.  Limite o evite el consumo de alcohol como se lo hayan indicado el mdico o el nutricionista. Lea las etiquetas de los alimentos  Verifique la cantidad de sodio por porcin en las etiquetas de los alimentos. Elija alimentos que contengan menos de  140 mg (miligramos) de sodio por porcin.  Lea las etiquetas de los alimentos para conocer la cantidad de caloras por porcin. Esto es importante si debe limitar la ingesta diaria de caloras para Horticulturist, commercial.  Consulte las etiquetas de los alimentos para conocer el tamao de las porciones. Si come ms de una porcin, consumir ms sodio y caloras que lo indicado en la etiqueta.  Busque los alimentos etiquetados como "sin sodio", "muy bajo en sodio" o "bajo en sodio". ? Los alimentos [employer] "sodio reducido" o "ligeramente salado" pueden contener ms sodio que el recomendado para usted. Al cocinar  Evite agregar sal cuando cocine. Consulte al mdico o al nutricionista antes de usar sustitutos de la sal.  Condimente la comida con condimentos, especias o hierbas sin sal. Lea la etiqueta de las mezclas de condimentos para asegurarse de que no contengan sal.  Cocine con aceites cardiosaludables, como oliva, canola, soja o girasol.  No fra los alimentos. A la hora de cocinarlos, utilice mtodos con bajo contenido de [place], como hornearlos, hervirlos, Educational psychologist y asarlos a Administrator, arts.  Limite las grasas poco saludables haciendo lo siguiente: ? Quite la piel a la carne de ave, como el pollo. ? Quite todas las grasas visibles de las carnes. ? Retire la grasa de los guisos, las sopas y las salsas antes de servirlos. Planificacin de las comidas   Limite su consumo de: ? Alimentos procesados, enlatados o empaquetados. ? Alimentos con alto contenido de grasas trans, como los alimentos fritos. ? Dulces, postres, bebidas azucaradas y 82  alimentos con azcar agregada. ? Alimentos lcteos enteros, como la [place].  Consuma una dieta equilibrada, que incluya lo siguiente: ? Entre 4 y [place], y [place] 4 y 40 porciones de verduras [employer]. Trate de que la mitad del plato de cada comida sean frutas y verduras. ? Hasta 6 u 8 porciones de cereales  integrales por da. ? Hasta 2 porciones de carne [place], de ave o de [employer]ney Bowes. Una porcin de carne equivale a 3 onzas. Esto es casi el mismo tamao que una baraja de cartas. ? Dos porciones de productos lcteos descremados por Training and development officer. ? Grasas cardiosaludables. Las grasas saludables llamadas cidos grasos omega-3 se encuentran en alimentos como las semillas de lino y los pescados de agua fra, como la sardina, el salmn y la caballa.  Trate de consumir entre 25 y 35 g (gramos) de Conservator, museum/gallery. Los alimentos con alto contenido de fibra incluyen [place], brcoli, zanahorias, frijoles, guisantes y Psychologist, prison and probation services.  No agregue sal o condimentos que contengan sal (como salsa de soja) a la comida antes de comer.  Cuando coma en un restaurante, pida que preparen su comida con menos sal o, en lo posible, sin nada de sal.  Trate de comer al menos 2 comidas vegetarianas por semana.  Consuma ms comida casera y menos de restaurante, de buf y comida rpida. Alimentos recomendados Esta podra no ser [employer]. Hable con el nutricionista sobre las mejores opciones alimenticias para usted. Granos Pan con menos de 80 mg de sodio por rebanada. Pasta integral, quinua y arroz integral. Avena. Cebada. Mijo. Smola y crema de trigo. Cereal fro integral o de salvado. [place] verduras frescas. Las verduras congeladas sin salsa o sal agregada. Verduras enlatadas con bajo contenido de sodio o sin sodio. Frutas Todas las frutas frescas, congeladas y [place]. Frutas secas, como pasas, ciruelas pasa y arndanos. Carnes y otros alimentos ricos en protenas Cortes de carne [place]. Pollo y pavo sin piel. Pescados con alto contenido de cidos grasos omega-3, como el salmn, la sardina y otros pescados de agua fra. Huevos. Frijoles secos, guisantes y [place]. Frutos secos y [place] de [place] sin [place] en polvo con bajo contenido de grasa o sin grasa  (descremada). Leche de arroz, [place] de soja y Myrtle de [place]. Yogur semidescremado o descremado. Cantidades pequeas de cubos de queso con cantidad reducida de sodio. Queso cottage con bajo contenido de sodio. Grasas y aceites Aceite de [place], canola, soja, lino o [place]. [place]. Dulces y postres Pur de Firefighter. Barras de granola. Budines y gelatina sin azcar. Barras de fruta congeladas. Condimentos y otros alimentos Hierbas frescas y secas. Jugo de [place]. Vinagre. Ktchup con bajo contenido de [place]. Adobos, aderezos para ensaladas, salsas y condimentos sin sal. Alimentos que deben evitarse Esta podra no ser [employer] Company. Hable con el nutricionista sobre las mejores opciones alimenticias para usted. Granos Pan con no ms de 80 mg de sodio por rebanada. Cereal caliente o fro con ms de 140 mg de sodio por porcin. Pretzels y galletas salados. Pan rallado empaquetado. Bagels, croissants y galletas. Verduras Verduras enlatadas. Verduras congeladas con salsa o condimentos. Verduras con crema. Papas fritas. Anillos de cebollas. Verduras encurtidas y chucrut. Lambert Mody Frutas secas con conservantes que tienen sodio. Carnes y otros alimentos ricos en protenas [place] y [place] de pollo. [place], jamn, pepperoni, boloesa, salame y fiambres empaquetados. Hot dogs, bratwurst y salchichas. Carnes  enlatadas. Carnes y [employer]. Frutos secos y semillas con sal. Lcteos Leche Crook, mezcla de [place] y crema, y crema. Suero de [place]. Quesos procesados, quesos para untar y [place]. [place] comn. Queso feta. Queso rallado. Queso en hebras. Grasas y aceites [place], [place] de [place], [place], [place] clarificada y Wendee Copp de tocino. Salsas en lata y envasadas. Condimentos y otros alimentos Sal de cebolla y ajo, sal de mesa y sal marina. Adobos. Aderezos comunes para ensalada. Salsas, pepinillos y [place]. Saborizantes y ablandadores de carne, y cubos de  caldo. Rbano picante, ktchup y [place]. Salsa [place]. Salsa teriyaki, salsa de soja (incluso la que tiene contenido reducido de sodio). Salsa picante y [place], salsa de pescado, salsa de [place] y salsa cctel. Aderezos para tacos. Salsa barbacoa. Salsa trtara. Resumen  Un plan de alimentacin para la insuficiencia cardaca incluye la implementacin de cambios para limitar la ingesta de sodio y grasas poco saludables, y puede ayudarlo a Horticulturist, commercial o a Theatre manager un peso saludable. El mdico tambin puede recomendarle que limite la cantidad de lquido que toma.  La mayora de las personas con insuficiencia cardaca no deben consumir ms de 2,300 mg de sal (sodio) por da. La cantidad de [employer] Inc recomienden puede ser ms baja, segn su afeccin.  Comunquese con el mdico o el nutricionista antes de implementar cambios significativos en la alimentacin. Esta informacin no tiene Marine scientist el consejo del mdico. Asegrese de hacerle al mdico cualquier pregunta que tenga. Document Revised: 12/21/2017 Document Reviewed: 12/17/2017 Elsevier Patient Education  2020 [place].  Prediabetes Prediabetes La prediabetes es la afeccin de tener un nivel de azcar en la sangre (glucemia ms alto de lo normal, aunque no lo suficientemente alto para recibir un diagnstico de diabetes tipo2. El hecho de ser prediabtico lo pone en riesgo de desarrollar diabetes tipo 2 (diabetes mellitus tipo2). La prediabetes puede denominarse intolerancia a la glucosa o alteracin de la glucosa en ayunas. Generalmente, la prediabetes no causa sntomas. El mdico puede diagnosticar esta afeccin por los anlisis de [place]. Es posible que le realicen un anlisis para determinar si tiene prediabetes si tiene sobrepeso y al menos algn otro factor de riesgo de prediabetes. Qu es la glucemia y cmo se mide? La glucemia es la cantidad de glucosa presente en el torrente sanguneo. La  glucosa proviene de los alimentos que ingiere que contienen azcares y almidones (hidratos de carbono), que el cuerpo descompone en glucosa. El nivel de glucemia se puede medir en mg/dl (miligramos por decilitro) o mmol/l (milimoles por litro). La glucemia puede comprobarse con uno o ms de los siguientes anlisis de sangre:  Prueba de la glucemia en ayunas. No se le permitir comer (tendr que hacer ayuno) durante 8horas o ms antes de que se le tome una [place] de [place]. ? El rango normal de glucemia en ayunas es de 70 a 100mg /dl (3,9 a 5,37mmol/l).  Una prueba de sangre de A1c (hemoglobina A1c). Esta prueba proporciona informacin sobre el control de la glucemia durante los ltimos 2 o 29meses.  Prueba de tolerancia a la glucosa oral (PTGO). Esta prueba mide la glucemia en dos momentos: ? Despus de ayunar. Este es el valor inicial. ? Dos horas despus de tomar una bebida que contiene glucosa. Pueden diagnosticarle prediabetes en los siguientes casos:  Si su glucemia en ayunas es de 100 a 125 mg/dl (5,6 a 6,9 mmol/l).  Si su nivel de A1c es de 5,7 a 6,4%.  Si su  resultado de la PTGO es de 140 a 199mg /dl (7,8 a 18mmol/l). Estas pruebas de sangre se pueden repetir para Freight forwarder. Cmo puede afectarme esta enfermedad? El pncreas produce una hormona (insulina) que ayuda a Civil Service fast streamer glucosa del torrente sanguneo al interior de las clulas. Cuando las clulas del cuerpo no responden adecuadamente a la insulina que el cuerpo produce (resistencia a la insulina), se acumula exceso de glucosa en la sangre en lugar de entrar en las clulas. Como [place], se puede producir un nivel alto de glucemia (hiperglucemia), lo cual puede causar muchas complicaciones. La hiperglucemia es un sntoma de prediabetes. Tener la glucemia alta durante mucho tiempo es peligroso. Demasiada glucosa en la sangre puede daar los nervios y los vasos sanguneos. El dao prolongado puede derivar en  complicaciones de la diabetes, que pueden incluir:  Enfermedad cardaca.  Accidente cerebrovascular.  Ceguera.  Enfermedad renal.  Depresin.  Circulacin deficiente en los pies y las piernas, lo cual podra ocasionar una extirpacin quirrgica (amputacin) en casos graves. [place]? Algunos de los factores de riesgo de prediabetes son los siguientes:  Best boy un familiar con diabetes tipo2.  Tener exceso de [place] u obesidad.  Ser mayor de 79aos de edad.  Ser descendiente de indgenas norteamericanos, afroamericanos, hispanos o latinos, o asiticos o isleos del Pacfico.  Tener un estilo de vida inactivo (sedentario).  Tener antecedentes de enfermedades cardacas.  En las mujeres, tener antecedentes de diabetes gestacional o sndrome del ovario poliqustico (SOP).  Tener niveles bajos del colesterol bueno (HDL-C) o niveles altos de grasas en la sangre (triglicridos).  Tener presin arterial alta. Qu puedo hacer para prevenir la diabetes?      Haga actividad fsica. ? Haga actividad fsica de intensidad moderada durante 94minutos o ms 5das por semana o con la frecuencia que le indique su mdico. Esto podra incluir caminatas dinmicas, ciclismo o gimnasia acutica. ? Pregntele al mdico qu actividades son seguras para usted. Una combinacin de actividades puede ser la mejor opcin, por ejemplo, caminar, practicar natacin, andar en bicicleta y hacer entrenamiento de fuerza.  Baje de [employer] se lo haya indicado el mdico. ? Bajar entre el 5% y el 7% del peso corporal puede revertir la resistencia a la insulina. ? El mdico puede determinar cunto peso tiene que perder y Hotel manager a que adelgace de Geographical information systems officer segura.  Siga un plan de alimentacin saludable. Este incluye consumir protenas magras, hidratos de carbono complejos, frutas y verduras frescas, productos lcteos con bajo contenido de grasa y grasas saludables. ? Siga las indicaciones  del mdico respecto de las restricciones de comidas o bebidas. ? Programe una cita con un especialista en alimentacin y nutricin (nutricionista certificado) para que lo ayude a Probation officer plan de alimentacin saludable adecuado para usted.  No fume ni consuma ningn producto que contenga tabaco, lo que incluye cigarrillos, tabaco de Higher education careers adviser y Psychologist, sport and exercise. Si necesita ayuda para dejar de fumar, consulte al [employer].  [employer] recetados y de venta libre como se lo haya indicado el mdico. Posiblemente le receten medicamentos para ayudarlo a reducir el riesgo de tener diabetes tipo2.  Concurra a todas las visitas de seguimiento como se lo haya indicado el mdico. Esto es importante. Resumen  La prediabetes es la afeccin de tener un nivel de azcar en la sangre (glucemia ms alto de lo normal, aunque no lo suficientemente alto para recibir un diagnstico de diabetes tipo2.  El hecho de ser prediabtico lo pone en riesgo de  desarrollar diabetes tipo 2 (diabetes mellitus tipo2).  Para ayudar a prevenir la diabetes tipo2, haga cambios en el estilo de vida, como realizar actividad fsica y comer alimentos saludables. Baje de [employer] se lo haya indicado el mdico. Esta informacin no tiene Marine scientist el consejo del mdico. Asegrese de hacerle al mdico cualquier pregunta que tenga. Document Revised: 05/18/2017 Document Reviewed: 04/10/2015 Elsevier Patient Education  [place] de alimentacin para personas con prediabetes Prediabetes Eating Plan La prediabetes es una afeccin que hace que los niveles de azcar en la sangre (glucosa) sean ms altos de lo normal. Esto aumenta el riesgo de tener diabetes. Para prevenir la diabetes, es posible que su mdico le recomiende cambios en la dieta y otros cambios en su estilo de vida que lo ayuden a Scientist, forensic lo siguiente:  Chief Technology Officer los niveles de glucemia.  Mejorar los niveles de [place].  Controlar  la presin arterial. El mdico puede recomendarle que trabaje con un especialista en alimentacin y nutricin (nutricionista) para Higher education careers adviser de comidas ms conveniente para usted. Consejos para seguir este plan: Estilo de vida  Establezca metas para bajar de peso con la ayuda de su equipo de atencin mdica. A la [employer] con prediabetes se les recomienda bajar un 7% de su peso corporal.  Haga ejercicio al menos 16minutos 5das por semana, como mnimo.  Asista a un grupo de apoyo o solicite el apoyo continuo de un consejero de salud mental.  [employer] medicamentos de venta libre y los recetados solamente como se lo haya indicado el mdico. Leer las etiquetas de los alimentos  Lea las etiquetas de los alimentos envasados para controlar la cantidad de grasa, sal (sodio) y azcar que contienen. Evite los alimentos que contengan lo siguiente: ? Grasas saturadas. ? Grasas trans. ? Azcares agregados.  Evite los alimentos que contengan ms de 366miligramos(mg) de sodio por porcin. Limite el consumo diario de sodio a menos de 2300mg  por Training and development officer. De compras  Evite comprar alimentos procesados y preelaborados. Coccin  Cocine con aceite de oliva. No use mantequilla, manteca de cerdo o [employer]  Cocine los alimentos al horno, a la parrilla, asados o hervidos. Evite frerlos. Planificacin de las comidas   Trabaje con el nutricionista para crear un plan de alimentacin que sea adecuado para usted. Esto puede incluir lo siguiente: ? Registro de la cantidad de caloras que ingiere. Use un registro de alimentos, un cuaderno o una aplicacin mvil para anotar lo que comi en cada comida. ? Uso del ndice glucmico (IG) para planificar las comidas. El ndice [place] con qu rapidez elevar la glucemia un alimento. Elija alimentos con bajo IG. Estos demoran ms en elevar la glucemia.  Considere la posibilidad de seguir Web designer. Esta dieta  incluye lo siguiente: ? Varias porciones de frutas y verduras frescas por da. ? Pescado al [employer] veces por semana. ? Varias porciones de cereales integrales, frijoles, frutos secos y semillas por da. ? Aceite de Tour manager de otras grasas. ? Consumo moderado de alcohol. ? Pequeas cantidades de carnes rojas y lcteos enteros.  Si tiene hipertensin arterial, quizs Development worker, international aid consumo de sodio o seguir una dieta como el plan de alimentacin basado en Enfoques Alimentarios para Detener la Hipertensin (Dietary Approaches to Stop Hypertension, DASH). Este es un plan de alimentacin cuyo objetivo es bajar la hipertensin arterial. Qu alimentos se recomiendan? Es posible que los alimentos incluidos a continuacin no Administrator, sports  completa. Hable con el nutricionista sobre las mejores opciones alimenticias para usted. Cereales Productos integrales, como panes, galletas, cereales y pastas de salvado o integrales. Avena sin azcar. Trigo burgol. Cebada. Quinua. Arroz integral. Tacos o tortillas de harina de maz o de salvado. [place] Valeda Malm. Espinaca. Guisantes. Remolachas. Coliflor. Repollo. Brcoli. Zanahorias. Tomates. Calabaza. Augustin Coupe. Hierbas. Pimienta. Cebollas. Pepinos. Repollitos de Bruselas. Frutas Frutos rojos. Bananas. Manzanas. Naranjas. Uvas. Papaya. Mango. Coy. Kiwi. Pomelo. Cerezas. Carnes y otros alimentos ricos en protenas Mariscos. Carne de ave sin piel. Cortes magros de cerdo y carne de res. Tofu. Huevos. Frutos secos. Frijoles. Lcteos Productos lcteos descremados o semidescremados, como yogur, queso cottage y [place]. [employer]. T. Caf. Gaseosas sin azcar o dietticas. Soda. Leche descremada o semidescremada. Productos alternativos a la [place], como [place] de soja o de [place]. Grasas y aceites Aceite de [place]. Aceite de canola. Aceite de girasol. Aceite de semillas de uva. [place]. Nueces. Dulces y postres Pudin sin azcar o con bajo  contenido de [place]. Helado y otros postres congelados sin azcar o con bajo contenido de [place]. Condimentos y otros alimentos Hierbas. Especias sin sodio. Mostaza. Salsa de pepinillos. [place] con bajo contenido de [place] y de Location manager. Salsa barbacoa con bajo contenido de grasa y de azcar. Mayonesa con bajo contenido de grasa o sin grasa. Qu alimentos no se recomiendan? Es posible que los alimentos incluidos a continuacin no Systems developer. Hable con el nutricionista sobre las mejores opciones alimenticias para usted. Cereales Productos elaborados con [place] y [place], como panes, pastas, bocadillos y cereales. Verduras Verduras enlatadas. Verduras congeladas con mantequilla o salsa de crema. Lambert Mody Frutas enlatadas al almbar. Carnes y otros alimentos ricos en protenas Cortes de carne con grasa. Carne de ave con piel. Carne empanizada o frita. Carnes procesadas. Lcteos Yogur, [place] enteros. Bebidas Bebidas azucaradas, como t helado dulce y [place]. Grasas y [employer]. [place]. Mantequilla clarificada. Dulces y [employer], como pasteles, pastelitos, galletas dulces y tarta de [place]. Condimentos y otros alimentos Mezclas de especias con sal agregada. [place]. Salsa barbacoa. Mayonesa. Resumen  Para prevenir la diabetes, es posible que Astronomer en la dieta y otros cambios en su estilo de vida para ayudar a Diplomatic Services operational officer en la sangre, mejorar los niveles de colesterol y Aeronautical engineer presin arterial.  Establezca metas para bajar de peso con la ayuda de su equipo de atencin mdica. A la [employer] con prediabetes se les recomienda bajar un 7por ciento de su peso corporal.  Considere la posibilidad de seguir una dieta mediterrnea que incluya muchas frutas y verduras frescas, cereales integrales, frijoles, frutos secos, semillas, pescado, carnes magras, lcteos descremados y aceites  saludables. Esta informacin no tiene Marine scientist el consejo del mdico. Asegrese de hacerle al mdico cualquier pregunta que tenga. Document Revised: 09/01/2016 Document Reviewed: 09/01/2016 Elsevier Patient Education  [place].

## 2019-05-11 NOTE — Progress Notes (Signed)
Established Patient Office Visit  Subjective:  Patient ID: Lucas [name], male    DOB: 1956-01-29  Age: 64 y.o. MRN: [id number]  CC:  Chief Complaint  Patient presents with  . Follow-up    HPI Lucas [name] presents for follow up of CHF, Prediabetes and CAD. He gave consent for Ms He states that he's compliant with his medications, continues to make healthy lifestyle choices. He states that he checks his weight biweekly or monthly because it's stable. He states that he checks his blood pressure biweekly at the Pharmacy and it's usually less than 130/90. His last HgbA1c done on 08/19/2018 was 5.9% and he adheres to low carb/ non concentrated sweet diet. He denies chest pain, palpitation, light headedness, shortness of breath,edema, myalgia, hematuria and hematochezia. Overall, he states that he's doing well and offers no further complaint.  Past Medical History:  Diagnosis Date  . Acute ST elevation myocardial infarction (STEMI) involving left anterior descending (LAD) coronary artery ([place])    a. 01/2017 Ant STEMI/PCI: LAD 20ost, 100p (3.0x23 Xience Sierra DES).  . CAD (coronary artery disease)    a. 01/2017 Ant STEMI/PCI: LM 20d, LAD 20ost, 100p (3.0x23 Xience Sierra DES), 36m, D1 50ost, LCX nl, RCA 30p; b. 11/2017 MV: Intermediate risk w/ 59mm inf ST dep; c. 11/2017 Cath: LM nl, LAD patent prox stent, D1 20ost, LCX nl, RCA 30p. EF 45-50%.  . Chronic combined systolic (congestive) and diastolic (congestive) heart failure ([place])    a. 01/2018 Echo: EF 30-35%, sev antsept, ant, apical HK. Gr1 DD, mild MR.  . Essential hypertension   . Hyperlipidemia    a. Myalgias with high dose lipitor - tolerating 40mg  daily.  . Ischemic cardiomyopathy    a. 01/2018 Echo: EF 30-35%.  . Type II diabetes mellitus ([place])     Past Surgical History:  Procedure Laterality Date  . APPENDECTOMY    . CARDIAC CATHETERIZATION    . CORONARY ANGIOPLASTY    . CORONARY/GRAFT ACUTE MI  REVASCULARIZATION N/A 01/25/2017   Procedure: Coronary/Graft Acute MI Revascularization;  Surgeon: Nelva Bush, MD;  Location: [place] [facility];  Service: Cardiovascular;  Laterality: N/A;  . LEFT HEART CATH AND CORONARY ANGIOGRAPHY N/A 01/25/2017   Procedure: LEFT HEART CATH AND CORONARY ANGIOGRAPHY;  Surgeon: Nelva Bush, MD;  Location: [place] [facility];  Service: Cardiovascular;  Laterality: N/A;  . LEFT HEART CATH AND CORONARY ANGIOGRAPHY N/A 11/20/2017   Procedure: LEFT HEART CATH AND CORONARY ANGIOGRAPHY;  Surgeon: Wellington Hampshire, MD;  Location: [place] [facility];  Service: Cardiovascular;  Laterality: N/A;    Family History  Problem Relation Age of Onset  . Heart disease Father        s/p pacemaker  . Arrhythmia Father   . Asthma Father   . Asthma Mother     Social History   Socioeconomic History  . Marital status: Married    Spouse name: Not on file  . Number of children: Not on file  . Years of education: Not on file  . Highest education level: Not on file  Occupational History  . Not on file  Tobacco Use  . Smoking status: Never Smoker  . Smokeless tobacco: Never Used  Substance and Sexual Activity  . Alcohol use: No  . Drug use: No  . Sexual activity: Yes    Birth control/protection: Coitus interruptus, None  Other Topics Concern  . Not on file  Social History Narrative  . Not on file  Social Determinants of Health   Financial Resource Strain:   . Difficulty of Paying Living Expenses: Not on file  Food Insecurity:   . Worried About Charity fundraiser in the Last Year: Not on file  . Ran Out of Food in the Last Year: Not on file  Transportation Needs:   . Lack of Transportation (Medical): Not on file  . Lack of Transportation (Non-Medical): Not on file  Physical Activity: Unknown  . Days of Exercise per Week: Not on file  . Minutes of Exercise per Session: 60 min  Stress:   . Feeling of Stress : Not on file  Social Connections:    . Frequency of Communication with Friends and Family: Not on file  . Frequency of Social Gatherings with Friends and Family: Not on file  . Attends Religious Services: Not on file  . Active Member of Clubs or Organizations: Not on file  . Attends Archivist Meetings: Not on file  . Marital Status: Not on file  Intimate Partner Violence:   . Fear of Current or Ex-Partner: Not on file  . Emotionally Abused: Not on file  . Physically Abused: Not on file  . Sexually Abused: Not on file    Outpatient Medications Prior to Visit  Medication Sig Dispense Refill  . carvedilol (COREG) 6.25 MG tablet Take 1 tablet (6.25 mg total) by mouth 2 (two) times daily with a meal. 180 tablet 0  . sacubitril-valsartan (ENTRESTO) 24-26 MG Take 1 tablet by mouth 2 (two) times daily. 180 tablet 0  . aspirin 81 MG chewable tablet Take one pill by mouth every day. 90 tablet 0  . atorvastatin (LIPITOR) 20 MG tablet Take 0.5 tablets (10 mg total) by mouth daily. 45 tablet 0  . clopidogrel (PLAVIX) 75 MG tablet Take 1 tablet (75 mg total) by mouth daily. 90 tablet 0  . metFORMIN (GLUCOPHAGE) 500 MG tablet Take 1 tablet (500 mg total) by mouth daily with breakfast. 90 tablet 0  . spironolactone (ALDACTONE) 25 MG tablet Take 0.5 tablets (12.5 mg total) by mouth daily. 45 tablet 0  . nitroGLYCERIN (NITROSTAT) 0.4 MG SL tablet Place 1 tablet (0.4 mg total) under the tongue every 5 (five) minutes as needed for chest pain. Maximum of 3 doses. 90 tablet 3   No facility-administered medications prior to visit.    No Known Allergies  ROS Review of Systems  Constitutional: Negative.   Eyes: Negative.   Respiratory: Negative.   Cardiovascular: Negative.   Gastrointestinal: Negative.   Endocrine: Negative.   Neurological: Negative.   Hematological: Negative.   Psychiatric/Behavioral: Negative.       Objective:    Physical Exam  Constitutional: He is oriented to person, place, and time. He appears  well-developed.  HENT:  Head: Normocephalic and atraumatic.  Eyes: Pupils are equal, round, and reactive to light. EOM are normal.  Cardiovascular: Normal rate and regular rhythm.  Pulmonary/Chest: Effort normal and breath sounds normal.  Musculoskeletal:        General: Normal range of motion.  Neurological: He is alert and oriented to person, place, and time.  Psychiatric: He has a normal mood and affect. His behavior is normal. Judgment and thought content normal.    BP 129/83 (BP Location: Left Arm, Patient Position: Sitting, Cuff Size: Small)   Pulse 71   Temp 98.1 F (36.7 C)   Ht 5' (1.524 m)   Wt 149 lb 11.2 oz (67.9 kg)   SpO2 95%  BMI 29.24 kg/m  Wt Readings from Last 3 Encounters:  05/11/19 149 lb 11.2 oz (67.9 kg)  05/11/19 152 lb (68.9 kg)  12/02/18 161 lb (73 kg)     Health Maintenance Due  Topic Date Due  . Hepatitis C Screening  Sep 10, 1955  . PNEUMOCOCCAL POLYSACCHARIDE VACCINE AGE 66-64 HIGH RISK  11/17/1959  . URINE MICROALBUMIN  11/17/1967  . TETANUS/TDAP  11/16/1976  . HEMOGLOBIN A1C  02/18/2019    There are no preventive care reminders to display for this patient.  Lab Results  Component Value Date   TSH 3.160 08/19/2018   Lab Results  Component Value Date   WBC 7.2 08/19/2018   HGB 15.5 08/19/2018   HCT 45.7 08/19/2018   MCV 95 08/19/2018   PLT 209 08/19/2018   Lab Results  Component Value Date   NA 137 08/19/2018   K 4.1 08/19/2018   CO2 24 08/19/2018   GLUCOSE 156 (H) 08/19/2018   BUN 19 08/19/2018   CREATININE 0.87 08/19/2018   BILITOT 0.4 08/19/2018   ALKPHOS 57 08/19/2018   AST 14 08/19/2018   ALT 17 08/19/2018   PROT 7.0 08/19/2018   ALBUMIN 4.5 08/19/2018   CALCIUM 9.0 08/19/2018   ANIONGAP 9 11/20/2017   Lab Results  Component Value Date   CHOL 96 (L) 08/19/2018   Lab Results  Component Value Date   HDL 31 (L) 08/19/2018   Lab Results  Component Value Date   LDLCALC 29 08/19/2018   Lab Results  Component  Value Date   TRIG 180 (H) 08/19/2018   Lab Results  Component Value Date   CHOLHDL 3.1 08/19/2018   Lab Results  Component Value Date   HGBA1C 5.9 (H) 08/19/2018      Assessment & Plan:   1. Chronic systolic heart failure ([place]) - He will continue on current treatment regimen and was encouraged to complete charity care application for  AMB referral to CHF clinic - aspirin 81 MG chewable tablet; Take one pill by mouth every day.  Dispense: 90 tablet; Refill: 0 - spironolactone (ALDACTONE) 25 MG tablet; Take 0.5 tablets (12.5 mg total) by mouth daily.  Dispense: 45 tablet; Refill: 0 - 2. Essential hypertension  His blood pressure is under control and he will continue on current treatment regimen. He was advised to continue on DASH diet, exercise as tolerated and check his blood pressure biweekly at the Pharmacy. - aspirin 81 MG chewable tablet; Take one pill by mouth every day.  Dispense: 90 tablet; Refill: 0  3. STEMI involving left anterior descending coronary artery ([place]) - He will continue on current treatment regimen. - aspirin 81 MG chewable tablet; Take one pill by mouth every day.  Dispense: 90 tablet; Refill: 0 - atorvastatin (LIPITOR) 20 MG tablet; Take 0.5 tablets (10 mg total) by mouth daily.  Dispense: 45 tablet; Refill: 0 - clopidogrel (PLAVIX) 75 MG tablet; Take 1 tablet (75 mg total) by mouth daily.  Dispense: 90 tablet; Refill: 0 - spironolactone (ALDACTONE) 25 MG tablet; Take 0.5 tablets (12.5 mg total) by mouth daily.  Dispense: 45 tablet; Refill: 0  4. Prediabetes - His last HgbA1c was 5.9%, and he will continue on current treatment regimen and continue on Low carb/ non concentrated sweet diet and exercise as tolerated. - Urine Microalbumin w/creat. ratio; Future - metFORMIN (GLUCOPHAGE) 500 MG tablet; Take 1 tablet (500 mg total) by mouth daily with breakfast.  Dispense: 90 tablet; Refill: 0 - Urine Microalbumin w/creat. ratio  Follow-up: Return in about  15 days (around 05/26/2019), or if symptoms worsen or fail to improve.    Ichelle Harral Jerold Coombe, NP

## 2019-05-12 ENCOUNTER — Ambulatory Visit: Payer: Self-pay

## 2019-05-12 LAB — MICROALBUMIN / CREATININE URINE RATIO
Creatinine, Urine: 106.9 mg/dL
Microalb/Creat Ratio: 4 mg/g creat (ref 0–29)
Microalbumin, Urine: 4.7 ug/mL

## 2019-05-12 LAB — HEMOGLOBIN A1C
Est. average glucose Bld gHb Est-mCnc: 128 mg/dL
Hgb A1c MFr Bld: 6.1 % — ABNORMAL HIGH (ref 4.8–5.6)

## 2019-05-12 LAB — SPECIMEN STATUS REPORT

## 2019-05-13 ENCOUNTER — Ambulatory Visit: Payer: Self-pay | Attending: Internal Medicine

## 2019-05-13 DIAGNOSIS — Z23 Encounter for immunization: Secondary | ICD-10-CM

## 2019-05-13 NOTE — Progress Notes (Signed)
   Covid-19 Vaccination Clinic  Name:  Lucas [name]    MRN: [id number] DOB: 27-Apr-1955  05/13/2019  Mr. [name] was observed post Covid-19 immunization for 15 minutes without incident. He was provided with Vaccine Information Sheet and instruction to access the V-Safe system.   Mr. [name] was instructed to call 911 with any severe reactions post vaccine: Marland Kitchen Difficulty breathing  . Swelling of face and throat  . A fast heartbeat  . A bad rash all over body  . Dizziness and weakness   Immunizations Administered    Name Date Dose VIS Date Route   Moderna COVID-19 Vaccine 05/13/2019  9:15 AM 0.5 mL 02/01/2019 Intramuscular   Manufacturer: Moderna   Lot: [id number]   [place]antonBE:[id number]

## 2019-05-17 LAB — COMPREHENSIVE METABOLIC PANEL
ALT: 14 IU/L (ref 0–44)
AST: 17 IU/L (ref 0–40)
Albumin/Globulin Ratio: 1.7 (ref 1.2–2.2)
Albumin: 4.5 g/dL (ref 3.8–4.8)
Alkaline Phosphatase: 51 IU/L (ref 39–117)
BUN/Creatinine Ratio: 16 (ref 10–24)
BUN: 13 mg/dL (ref 8–27)
Bilirubin Total: 0.5 mg/dL (ref 0.0–1.2)
CO2: 26 mmol/L (ref 20–29)
Calcium: 9.8 mg/dL (ref 8.6–10.2)
Chloride: 100 mmol/L (ref 96–106)
Creatinine, Ser: 0.83 mg/dL (ref 0.76–1.27)
GFR calc Af Amer: 110 mL/min/{1.73_m2} (ref >59)
GFR calc non Af Amer: 95 mL/min/{1.73_m2} (ref >59)
Globulin, Total: 2.6 g/dL (ref 1.5–4.5)
Glucose: 162 mg/dL — ABNORMAL HIGH (ref 65–99)
Potassium: 4.2 mmol/L (ref 3.5–5.2)
Sodium: 139 mmol/L (ref 134–144)
Total Protein: 7.1 g/dL (ref 6.0–8.5)

## 2019-05-17 LAB — LIPID PANEL W/O CHOL/HDL RATIO
Cholesterol, Total: 105 mg/dL (ref 100–199)
HDL: 34 mg/dL — ABNORMAL LOW (ref >39)
LDL Chol Calc (NIH): 41 mg/dL (ref 0–99)
Triglycerides: 185 mg/dL — ABNORMAL HIGH (ref 0–149)
VLDL Cholesterol Cal: 30 mg/dL (ref 5–40)

## 2019-05-17 LAB — SPECIMEN STATUS REPORT

## 2019-05-17 NOTE — Progress Notes (Deleted)
Patient ID: Lucas [name], male    DOB: 22-Aug-1955, 64 y.o.   MRN: [id number]  HPI  Entire visit done with the [facility] interpreter present.  [name] is a 64 y/o male with a history of STEMI, HTN and heart failure.  Echo report on 05/29/17 reviewed and showed an EF of 40-45% along with mild MR. Echo report done 01/26/17 reviewed and shows an EF of 30-35% along with mild MR. Cardiac catheterization done 01/25/17 showed mild to moderate non-obstructive disease involving the mid/distal LAD and RCA. Moderately elevated left ventricular filling pressure. 100% thrombotic occlusion of the proximal LAD. DES was successfully placed.   He hasn't been admitted or been in the ED in the last 6 months.   He presents today for a follow-up visit although hasn't been seen since April 2019. He presents with a chief complaint of   Past Medical History:  Diagnosis Date  . Acute ST elevation myocardial infarction (STEMI) involving left anterior descending (LAD) coronary artery ([place])    a. 01/2017 Ant STEMI/PCI: LAD 20ost, 100p (3.0x23 Xience Sierra DES).  . CAD (coronary artery disease)    a. 01/2017 Ant STEMI/PCI: LM 20d, LAD 20ost, 100p (3.0x23 Xience Sierra DES), 22m, D1 50ost, LCX nl, RCA 30p; b. 11/2017 MV: Intermediate risk w/ 54mm inf ST dep; c. 11/2017 Cath: LM nl, LAD patent prox stent, D1 20ost, LCX nl, RCA 30p. EF 45-50%.  . Chronic combined systolic (congestive) and diastolic (congestive) heart failure ([place])    a. 01/2018 Echo: EF 30-35%, sev antsept, ant, apical HK. Gr1 DD, mild MR.  . Essential hypertension   . Hyperlipidemia    a. Myalgias with high dose lipitor - tolerating 40mg  daily.  . Ischemic cardiomyopathy    a. 01/2018 Echo: EF 30-35%.  . Type II diabetes mellitus ([place])    Past Surgical History:  Procedure Laterality Date  . APPENDECTOMY    . CARDIAC CATHETERIZATION    . CORONARY ANGIOPLASTY    . CORONARY/GRAFT ACUTE MI REVASCULARIZATION N/A 01/25/2017   Procedure:  Coronary/Graft Acute MI Revascularization;  Surgeon: Lucas Bush, MD;  Location: [place] [facility];  Service: Cardiovascular;  Laterality: N/A;  . LEFT HEART CATH AND CORONARY ANGIOGRAPHY N/A 01/25/2017   Procedure: LEFT HEART CATH AND CORONARY ANGIOGRAPHY;  Surgeon: Lucas Bush, MD;  Location: [place] [facility];  Service: Cardiovascular;  Laterality: N/A;  . LEFT HEART CATH AND CORONARY ANGIOGRAPHY N/A 11/20/2017   Procedure: LEFT HEART CATH AND CORONARY ANGIOGRAPHY;  Surgeon: Lucas Hampshire, MD;  Location: [place] [facility];  Service: Cardiovascular;  Laterality: N/A;   Family History  Problem Relation Age of Onset  . Heart disease Father        s/p pacemaker  . Arrhythmia Father   . Asthma Father   . Asthma Mother    Social History   Tobacco Use  . Smoking status: Never Smoker  . Smokeless tobacco: Never Used  Substance Use Topics  . Alcohol use: No   No Known Allergies    Review of Systems  Constitutional: Positive for fatigue (minimal). Negative for appetite change.  HENT: Negative for congestion, postnasal drip and sore throat.   Eyes: Negative.   Respiratory: Positive for shortness of breath. Negative for chest tightness.   Cardiovascular: Negative for chest pain, palpitations and leg swelling.  Gastrointestinal: Negative for abdominal distention and abdominal pain.  Endocrine: Negative.   Genitourinary: Negative.   Musculoskeletal: Negative for [name] pain and neck pain.  Skin: Negative.  Allergic/Immunologic: Negative.   Neurological: Positive for light-headedness (occasionally). Negative for dizziness and weakness.  Hematological: Negative for adenopathy. Does not bruise/bleed easily.  Psychiatric/Behavioral: Negative for dysphoric mood and sleep disturbance (sleeping on 2 pillows). The patient is not nervous/anxious.      Physical Exam  Constitutional: He is oriented to person, place, and time. He appears well-developed and well-nourished.   HENT:  Head: Normocephalic and atraumatic.  Neck: No JVD present.  Cardiovascular: Normal rate and regular rhythm.  Pulmonary/Chest: Effort normal. No respiratory distress. He has no wheezes. He has no rales.  Abdominal: Soft. He exhibits no distension. There is no abdominal tenderness.  Musculoskeletal:        General: No tenderness or edema.     Cervical [name]: Normal range of motion and neck supple.  Neurological: He is alert and oriented to person, place, and time.  Skin: Skin is warm and dry.  Psychiatric: He has a normal mood and affect. His behavior is normal. Thought content normal.  Nursing note and vitals reviewed.  Assessment & Plan:  1: Chronic heart failure with reduced ejection fraction- - NYHA class II - euvolemic today - weighing daily. Reminded to call for an overnight weight gain of >2 pounds or a weekly weight gain of >5 pounds  - not adding salt; reminded to closely follow a 2000mg  sodium diet. Can use Mrs Lucas [name] for seasoning   2: STEMI- - taking aspirin and clopidogrel - saw cardiologist (End) 09/01/2018  3: HTN-  - BP  - went to [facility] 05/11/19 - BMP from 05/11/19 reviewed and showed sodium 139, potassium 4.2, creatinine 0.83 and GFR 95   Patient did not bring his medications nor a list. Each medication was verbally reviewed with the patient and he was encouraged to bring the bottles to every visit to confirm accuracy of list

## 2019-05-18 ENCOUNTER — Telehealth: Payer: Self-pay | Admitting: Family

## 2019-05-18 ENCOUNTER — Ambulatory Visit: Payer: Self-pay | Admitting: Family

## 2019-05-18 NOTE — Telephone Encounter (Signed)
Patient did not show for his Heart Failure Clinic appointment on 05/18/19. Will attempt to reschedule.

## 2019-05-19 ENCOUNTER — Telehealth: Payer: Self-pay | Admitting: Gerontology

## 2019-05-19 ENCOUNTER — Ambulatory Visit: Payer: Self-pay | Admitting: Gerontology

## 2019-05-19 ENCOUNTER — Ambulatory Visit: Payer: Self-pay

## 2019-05-19 NOTE — Telephone Encounter (Signed)
Returning patient's call to reschedule appointment.  Was able to leave a message, will try again later

## 2019-05-26 ENCOUNTER — Ambulatory Visit: Payer: Self-pay | Admitting: Gerontology

## 2019-05-26 NOTE — Progress Notes (Signed)
Patient ID: Lucas [name], male    DOB: September 18, 1955, 64 y.o.   MRN: [id number]  HPI  Entire visit done with [employer] video interpretive services on the tablet  Lucas [name] is a 64 y/o male with a history of STEMI, HTN and heart failure.  Echo report on 05/29/17 reviewed and showed an EF of 40-45% along with mild Lucas. Echo report done 01/26/17 reviewed and shows an EF of 30-35% along with mild Lucas. Cardiac catheterization done 01/25/17 showed mild to moderate non-obstructive disease involving the mid/distal LAD and RCA. Moderately elevated left ventricular filling pressure. 100% thrombotic occlusion of the proximal LAD. DES was successfully placed.   He hasn't been admitted or been in the ED in the last 6 months.   He presents today for a follow-up visit although hasn't been seen since April 2019. He presents with a chief complaint of minimal fatigue upon moderate exertion. He describes this as chronic in nature having been present for several years. He has associated headaches, leg weakness and occasional chest pain on the left side when he takes a deep breath along with this. He denies any difficulty sleeping, dizziness, abdominal distention, palpitations, pedal edema, shortness of breath, cough or weight gain.   Past Medical History:  Diagnosis Date  . Acute ST elevation myocardial infarction (STEMI) involving left anterior descending (LAD) coronary artery ([place])    a. 01/2017 Ant STEMI/PCI: LAD 20ost, 100p (3.0x23 Xience Sierra DES).  . CAD (coronary artery disease)    a. 01/2017 Ant STEMI/PCI: LM 20d, LAD 20ost, 100p (3.0x23 Xience Sierra DES), 67m, D1 50ost, LCX nl, RCA 30p; b. 11/2017 MV: Intermediate risk w/ 30mm inf ST dep; c. 11/2017 Cath: LM nl, LAD patent prox stent, D1 20ost, LCX nl, RCA 30p. EF 45-50%.  . Chronic combined systolic (congestive) and diastolic (congestive) heart failure ([place])    a. 01/2018 Echo: EF 30-35%, sev antsept, ant, apical HK. Gr1 DD, mild Lucas.  . Essential  hypertension   . Hyperlipidemia    a. Myalgias with high dose lipitor - tolerating 40mg  daily.  . Ischemic cardiomyopathy    a. 01/2018 Echo: EF 30-35%.  . Type II diabetes mellitus ([place])    Past Surgical History:  Procedure Laterality Date  . APPENDECTOMY    . CARDIAC CATHETERIZATION    . CORONARY ANGIOPLASTY    . CORONARY/GRAFT ACUTE MI REVASCULARIZATION N/A 01/25/2017   Procedure: Coronary/Graft Acute MI Revascularization;  Surgeon: Nelva Bush, MD;  Location: [facility];  Service: Cardiovascular;  Laterality: N/A;  . LEFT HEART CATH AND CORONARY ANGIOGRAPHY N/A 01/25/2017   Procedure: LEFT HEART CATH AND CORONARY ANGIOGRAPHY;  Surgeon: Nelva Bush, MD;  Location: [facility];  Service: Cardiovascular;  Laterality: N/A;  . LEFT HEART CATH AND CORONARY ANGIOGRAPHY N/A 11/20/2017   Procedure: LEFT HEART CATH AND CORONARY ANGIOGRAPHY;  Surgeon: Wellington Hampshire, MD;  Location: [facility];  Service: Cardiovascular;  Laterality: N/A;   Family History  Problem Relation Age of Onset  . Heart disease Father        s/p pacemaker  . Arrhythmia Father   . Asthma Father   . Asthma Mother    Social History   Tobacco Use  . Smoking status: Never Smoker  . Smokeless tobacco: Never Used  Substance Use Topics  . Alcohol use: No   No Known Allergies  Prior to Admission medications   Medication Sig Start Date End Date Taking? Authorizing Provider  aspirin 81 MG  chewable tablet Take one pill by mouth every day. 05/11/19  Yes Iloabachie, Chioma E, NP  atorvastatin (LIPITOR) 20 MG tablet Take 0.5 tablets (10 mg total) by mouth daily. 05/11/19  Yes Iloabachie, Chioma E, NP  carvedilol (COREG) 6.25 MG tablet Take 1 tablet (6.25 mg total) by mouth 2 (two) times daily with a meal. 03/31/19  Yes Lanae Boast, FNP  clopidogrel (PLAVIX) 75 MG tablet Take 1 tablet (75 mg total) by mouth daily. 05/11/19  Yes Iloabachie, Chioma E, NP  metFORMIN (GLUCOPHAGE) 500 MG  tablet Take 1 tablet (500 mg total) by mouth daily with breakfast. 05/11/19  Yes Iloabachie, Chioma E, NP  sacubitril-valsartan (ENTRESTO) 24-26 MG Take 1 tablet by mouth 2 (two) times daily. 03/31/19  Yes Lanae Boast, FNP  spironolactone (ALDACTONE) 25 MG tablet Take 0.5 tablets (12.5 mg total) by mouth daily. 05/11/19  Yes Iloabachie, Chioma E, NP     Review of Systems  Constitutional: Positive for fatigue (minimal). Negative for appetite change.  HENT: Negative for congestion, postnasal drip and sore throat.   Eyes: Negative.   Respiratory: Negative for cough, chest tightness and shortness of breath.   Cardiovascular: Positive for chest pain (at times with deep breath on the left side). Negative for palpitations and leg swelling.  Gastrointestinal: Negative for abdominal distention and abdominal pain.  Endocrine: Negative.   Genitourinary: Negative.   Musculoskeletal: Negative for [name] pain and neck pain.  Skin: Negative.   Allergic/Immunologic: Negative.   Neurological: Positive for weakness (in legs) and headaches (at times). Negative for dizziness and light-headedness.  Hematological: Negative for adenopathy. Does not bruise/bleed easily.  Psychiatric/Behavioral: Negative for dysphoric mood and sleep disturbance (sleeping on 2 pillows). The patient is not nervous/anxious.    Vitals:   05/27/19 0909  BP: 125/79  Pulse: 71  Resp: 18  SpO2: 100%  Weight: 151 lb 2 oz (68.5 kg)  Height: 5' (1.524 m)   Wt Readings from Last 3 Encounters:  05/27/19 151 lb 2 oz (68.5 kg)  05/11/19 149 lb 11.2 oz (67.9 kg)  05/11/19 152 lb (68.9 kg)   Lab Results  Component Value Date   CREATININE 0.83 05/11/2019   CREATININE 0.87 08/19/2018   CREATININE 0.84 02/16/2018    Physical Exam  Constitutional: He is oriented to person, place, and time. He appears well-developed and well-nourished.  HENT:  Head: Normocephalic and atraumatic.  Neck: No JVD present.  Cardiovascular: Normal rate and  regular rhythm.  Pulmonary/Chest: Effort normal. No respiratory distress. He has no wheezes. He has no rales.  Abdominal: Soft. He exhibits no distension. There is no abdominal tenderness.  Musculoskeletal:        General: No tenderness or edema.     Cervical [name]: Normal range of motion and neck supple.  Neurological: He is alert and oriented to person, place, and time.  Skin: Skin is warm and dry.  Psychiatric: He has a normal mood and affect. His behavior is normal. Thought content normal.  Nursing note and vitals reviewed.  Assessment & Plan:  1: Chronic heart failure with reduced ejection fraction- - NYHA class II - euvolemic today - weighing daily. Reminded to call for an overnight weight gain of >2 pounds or a weekly weight gain of >5 pounds - saw cardiologist (End) 09/01/2018 - not adding salt; reminded to closely follow a 2000mg  sodium diet. Can use Mrs Deliah Boston for seasoning - will increase his carvedilol to 12.5mg  BID; instructed to finish his current bottle by taking 2 tablets  twice daily until gone and then when he picks up the new RX, he'll go [name] to taking 1 tablet twice daily - consider increasing entresto at next visit - could increase spironolactone and/or add farxiga in the future  2: HTN-  - BP looks good today - went to [facility] 05/11/19 & returns later this month - BMP from 05/11/19 reviewed and showed sodium 139, potassium 4.2, creatinine 0.83 and GFR 95   Medication bottles were reviewed.   Return in 1 month or sooner for any questions/problems before then.

## 2019-05-27 ENCOUNTER — Encounter: Payer: Self-pay | Admitting: Family

## 2019-05-27 ENCOUNTER — Other Ambulatory Visit: Payer: Self-pay

## 2019-05-27 ENCOUNTER — Ambulatory Visit: Payer: Self-pay | Attending: Family | Admitting: Family

## 2019-05-27 VITALS — BP 125/79 | HR 71 | Resp 18 | Ht 60.0 in | Wt 151.1 lb

## 2019-05-27 DIAGNOSIS — Z7982 Long term (current) use of aspirin: Secondary | ICD-10-CM | POA: Insufficient documentation

## 2019-05-27 DIAGNOSIS — I5042 Chronic combined systolic (congestive) and diastolic (congestive) heart failure: Secondary | ICD-10-CM | POA: Insufficient documentation

## 2019-05-27 DIAGNOSIS — Z955 Presence of coronary angioplasty implant and graft: Secondary | ICD-10-CM | POA: Insufficient documentation

## 2019-05-27 DIAGNOSIS — Z8249 Family history of ischemic heart disease and other diseases of the circulatory system: Secondary | ICD-10-CM | POA: Insufficient documentation

## 2019-05-27 DIAGNOSIS — E785 Hyperlipidemia, unspecified: Secondary | ICD-10-CM | POA: Insufficient documentation

## 2019-05-27 DIAGNOSIS — Z7984 Long term (current) use of oral hypoglycemic drugs: Secondary | ICD-10-CM | POA: Insufficient documentation

## 2019-05-27 DIAGNOSIS — I5022 Chronic systolic (congestive) heart failure: Secondary | ICD-10-CM

## 2019-05-27 DIAGNOSIS — I10 Essential (primary) hypertension: Secondary | ICD-10-CM

## 2019-05-27 DIAGNOSIS — E118 Type 2 diabetes mellitus with unspecified complications: Secondary | ICD-10-CM | POA: Insufficient documentation

## 2019-05-27 DIAGNOSIS — R5383 Other fatigue: Secondary | ICD-10-CM | POA: Insufficient documentation

## 2019-05-27 DIAGNOSIS — Z79899 Other long term (current) drug therapy: Secondary | ICD-10-CM | POA: Insufficient documentation

## 2019-05-27 DIAGNOSIS — R071 Chest pain on breathing: Secondary | ICD-10-CM | POA: Insufficient documentation

## 2019-05-27 DIAGNOSIS — R531 Weakness: Secondary | ICD-10-CM | POA: Insufficient documentation

## 2019-05-27 DIAGNOSIS — I110 Hypertensive heart disease with heart failure: Secondary | ICD-10-CM | POA: Insufficient documentation

## 2019-05-27 DIAGNOSIS — I255 Ischemic cardiomyopathy: Secondary | ICD-10-CM | POA: Insufficient documentation

## 2019-05-27 DIAGNOSIS — R519 Headache, unspecified: Secondary | ICD-10-CM | POA: Insufficient documentation

## 2019-05-27 DIAGNOSIS — I252 Old myocardial infarction: Secondary | ICD-10-CM | POA: Insufficient documentation

## 2019-05-27 DIAGNOSIS — I2510 Atherosclerotic heart disease of native coronary artery without angina pectoris: Secondary | ICD-10-CM | POA: Insufficient documentation

## 2019-05-27 MED ORDER — CARVEDILOL 12.5 MG PO TABS: 12.5000 mg | ORAL_TABLET | Freq: Two times a day (BID) | ORAL | 3 refills | Status: DC

## 2019-05-27 NOTE — Patient Instructions (Addendum)
Continue weighing daily and call for an overnight weight gain of > 2 pounds or a weekly weight gain of >5 pounds.   Finish your current carvedilol dose by taking 2 tablets twice daily until gone and then when you get the new prescription, you will then take 1 tablet twice daily

## 2019-05-31 ENCOUNTER — Other Ambulatory Visit: Payer: Self-pay

## 2019-05-31 ENCOUNTER — Ambulatory Visit: Payer: Self-pay | Admitting: Gerontology

## 2019-05-31 ENCOUNTER — Encounter: Payer: Self-pay | Admitting: Gerontology

## 2019-05-31 VITALS — BP 118/74 | HR 64 | Ht 60.0 in | Wt 150.5 lb

## 2019-05-31 DIAGNOSIS — I5022 Chronic systolic (congestive) heart failure: Secondary | ICD-10-CM

## 2019-05-31 DIAGNOSIS — I2102 ST elevation (STEMI) myocardial infarction involving left anterior descending coronary artery: Secondary | ICD-10-CM

## 2019-05-31 DIAGNOSIS — I10 Essential (primary) hypertension: Secondary | ICD-10-CM

## 2019-05-31 DIAGNOSIS — R7303 Prediabetes: Secondary | ICD-10-CM

## 2019-05-31 MED ORDER — ASPIRIN 81 MG PO CHEW: CHEWABLE_TABLET | ORAL | 1 refills | Status: DC

## 2019-05-31 MED ORDER — METFORMIN HCL 500 MG PO TABS: 500.0000 mg | ORAL_TABLET | Freq: Every day | ORAL | 1 refills | Status: DC

## 2019-05-31 MED ORDER — ATORVASTATIN CALCIUM 20 MG PO TABS: 10.0000 mg | ORAL_TABLET | Freq: Every day | ORAL | 2 refills | Status: DC

## 2019-05-31 NOTE — Progress Notes (Signed)
Established Patient Office Visit  Subjective:  Patient ID: Lucas [name], male    DOB: April 27, 1955  Age: 64 y.o. MRN: [id number]  CC:  Chief Complaint  Patient presents with  . Hypertension  . Prediabetes    HPI Lucas [name] presents for follow up of Prediabetes, Hypertension and medication refill. His HgbA1c done on 05/11/2019 increased from 5.9% to 6.1% and he states that he's compliant with his medications and continues to make healthy lifestyle changes. He states that he checks his blood pressure at the pharmacy and it's usually less than 130/90. He was seen at the CHF clinic on 05/27/2019 and he states that he weighs himself daily and he was started on 12.5mg  Carvedilol twice daily. He denies chest pain, light headedness, myalgia, fever and chills. He declines Ophthalmology referral and Pneumonia vaccine. Overall, he states that he's doing well and offers no further complaint.  Past Medical History:  Diagnosis Date  . Acute ST elevation myocardial infarction (STEMI) involving left anterior descending (LAD) coronary artery ([place])    a. 01/2017 Ant STEMI/PCI: LAD 20ost, 100p (3.0x23 Xience Sierra DES).  . CAD (coronary artery disease)    a. 01/2017 Ant STEMI/PCI: LM 20d, LAD 20ost, 100p (3.0x23 Xience Sierra DES), 45m, D1 50ost, LCX nl, RCA 30p; b. 11/2017 MV: Intermediate risk w/ 33mm inf ST dep; c. 11/2017 Cath: LM nl, LAD patent prox stent, D1 20ost, LCX nl, RCA 30p. EF 45-50%.  . Chronic combined systolic (congestive) and diastolic (congestive) heart failure ([place])    a. 01/2018 Echo: EF 30-35%, sev antsept, ant, apical HK. Gr1 DD, mild MR.  . Essential hypertension   . Hyperlipidemia    a. Myalgias with high dose lipitor - tolerating 40mg  daily.  . Ischemic cardiomyopathy    a. 01/2018 Echo: EF 30-35%.  . Type II diabetes mellitus ([place])     Past Surgical History:  Procedure Laterality Date  . APPENDECTOMY    . CARDIAC CATHETERIZATION    . CORONARY  ANGIOPLASTY    . CORONARY/GRAFT ACUTE MI REVASCULARIZATION N/A 01/25/2017   Procedure: Coronary/Graft Acute MI Revascularization;  Surgeon: Nelva Bush, MD;  Location: [facility];  Service: Cardiovascular;  Laterality: N/A;  . LEFT HEART CATH AND CORONARY ANGIOGRAPHY N/A 01/25/2017   Procedure: LEFT HEART CATH AND CORONARY ANGIOGRAPHY;  Surgeon: Nelva Bush, MD;  Location: [facility];  Service: Cardiovascular;  Laterality: N/A;  . LEFT HEART CATH AND CORONARY ANGIOGRAPHY N/A 11/20/2017   Procedure: LEFT HEART CATH AND CORONARY ANGIOGRAPHY;  Surgeon: Wellington Hampshire, MD;  Location: [facility];  Service: Cardiovascular;  Laterality: N/A;    Family History  Problem Relation Age of Onset  . Heart disease Father        s/p pacemaker  . Arrhythmia Father   . Asthma Father   . Asthma Mother     Social History   Socioeconomic History  . Marital status: Married    Spouse name: Not on file  . Number of children: Not on file  . Years of education: Not on file  . Highest education level: Not on file  Occupational History  . Occupation: unemployed  Tobacco Use  . Smoking status: Never Smoker  . Smokeless tobacco: Never Used  Substance and Sexual Activity  . Alcohol use: No  . Drug use: No  . Sexual activity: Yes    Birth control/protection: Coitus interruptus, None  Other Topics Concern  . Not on file  Social History Narrative  .  Not on file   Social Determinants of Health   Financial Resource Strain:   . Difficulty of Paying Living Expenses:   Food Insecurity:   . Worried About Charity fundraiser in the Last Year:   . Arboriculturist in the Last Year:   Transportation Needs:   . Film/video editor (Medical):   Marland Kitchen Lack of Transportation (Non-Medical):   Physical Activity: Unknown  . Days of Exercise per Week: Not on file  . Minutes of Exercise per Session: 60 min  Stress:   . Feeling of Stress :   Social Connections:   .  Frequency of Communication with Friends and Family:   . Frequency of Social Gatherings with Friends and Family:   . Attends Religious Services:   . Active Member of Clubs or Organizations:   . Attends Archivist Meetings:   Marland Kitchen Marital Status:   Intimate Partner Violence:   . Fear of Current or Ex-Partner:   . Emotionally Abused:   Marland Kitchen Physically Abused:   . Sexually Abused:     Outpatient Medications Prior to Visit  Medication Sig Dispense Refill  . carvedilol (COREG) 12.5 MG tablet Take 1 tablet (12.5 mg total) by mouth 2 (two) times daily. 180 tablet 3  . clopidogrel (PLAVIX) 75 MG tablet Take 1 tablet (75 mg total) by mouth daily. 90 tablet 0  . sacubitril-valsartan (ENTRESTO) 24-26 MG Take 1 tablet by mouth 2 (two) times daily. 180 tablet 0  . spironolactone (ALDACTONE) 25 MG tablet Take 0.5 tablets (12.5 mg total) by mouth daily. 45 tablet 0  . aspirin 81 MG chewable tablet Take one pill by mouth every day. 90 tablet 0  . atorvastatin (LIPITOR) 20 MG tablet Take 0.5 tablets (10 mg total) by mouth daily. 45 tablet 0  . metFORMIN (GLUCOPHAGE) 500 MG tablet Take 1 tablet (500 mg total) by mouth daily with breakfast. 90 tablet 0   No facility-administered medications prior to visit.    No Known Allergies  ROS Review of Systems  Constitutional: Negative.   Eyes: Negative.   Respiratory: Negative.   Cardiovascular: Negative.   Endocrine: Negative.   Neurological: Negative.       Objective:    Physical Exam  Constitutional: He is oriented to person, place, and time. He appears well-developed.  HENT:  Head: Normocephalic.  Eyes: Pupils are equal, round, and reactive to light. EOM are normal.  Cardiovascular: Normal rate and regular rhythm.  Pulmonary/Chest: Effort normal and breath sounds normal.  Neurological: He is alert and oriented to person, place, and time.  Psychiatric: He has a normal mood and affect. His behavior is normal. Judgment and thought content  normal.    BP 118/74 (BP Location: Left Arm, Patient Position: Sitting)   Pulse 64   Ht 5' (1.524 m)   Wt 150 lb 8 oz (68.3 kg)   SpO2 98%   BMI 29.39 kg/m  Wt Readings from Last 3 Encounters:  05/31/19 150 lb 8 oz (68.3 kg)  05/27/19 151 lb 2 oz (68.5 kg)  05/11/19 149 lb 11.2 oz (67.9 kg)   He was advised to continue on a weight loss regimen.  Health Maintenance Due  Topic Date Due  . Hepatitis C Screening  Never done  . TETANUS/TDAP  Never done    There are no preventive care reminders to display for this patient.  Lab Results  Component Value Date   TSH 3.160 08/19/2018   Lab Results  Component  Value Date   WBC 7.2 08/19/2018   HGB 15.5 08/19/2018   HCT 45.7 08/19/2018   MCV 95 08/19/2018   PLT 209 08/19/2018   Lab Results  Component Value Date   NA 139 05/11/2019   K 4.2 05/11/2019   CO2 26 05/11/2019   GLUCOSE 162 (H) 05/11/2019   BUN 13 05/11/2019   CREATININE 0.83 05/11/2019   BILITOT 0.5 05/11/2019   ALKPHOS 51 05/11/2019   AST 17 05/11/2019   ALT 14 05/11/2019   PROT 7.1 05/11/2019   ALBUMIN 4.5 05/11/2019   CALCIUM 9.8 05/11/2019   ANIONGAP 9 11/20/2017   Lab Results  Component Value Date   CHOL 105 05/11/2019   Lab Results  Component Value Date   HDL 34 (L) 05/11/2019   Lab Results  Component Value Date   LDLCALC 41 05/11/2019   Lab Results  Component Value Date   TRIG 185 (H) 05/11/2019   Lab Results  Component Value Date   CHOLHDL 3.1 08/19/2018   Lab Results  Component Value Date   HGBA1C 6.1 (H) 05/11/2019      Assessment & Plan:    1. Chronic systolic heart failure ([place]) - He will continue on current medication and follow up at the CHF clinic. - aspirin 81 MG chewable tablet; Take one pill by mouth every day.  Dispense: 90 tablet; Refill: 1  2. Essential hypertension - His blood pressure is under control. -He was advised to continue on Low salt DASH diet -Take medications regularly on time -Exercise regularly  as tolerated -Check blood pressure at least once a week at home or a nearby pharmacy and record -Goal is less than 140/90 and normal blood pressure is 120/80 - aspirin 81 MG chewable tablet; Take one pill by mouth every day.  Dispense: 90 tablet; Refill: 1 - Lipid panel; Future  3. STEMI involving left anterior descending coronary artery ([place]) - He will continue on current medication and follow up with CHF clinic. - aspirin 81 MG chewable tablet; Take one pill by mouth every day.  Dispense: 90 tablet; Refill: 1 - atorvastatin (LIPITOR) 20 MG tablet; Take 0.5 tablets (10 mg total) by mouth daily.  Dispense: 30 tablet; Refill: 2  4. Prediabetes - His HgbA1c was 6.1% and he will continue on current medication. He was advised to continue on low carb/non concentrated sweet diet and exercise as tolerated. - metFORMIN (GLUCOPHAGE) 500 MG tablet; Take 1 tablet (500 mg total) by mouth daily with breakfast.  Dispense: 90 tablet; Refill: 1 - HgB A1c; Future    Follow-up: Return in about 19 weeks (around 10/11/2019), or if symptoms worsen or fail to improve.    Lucas Tester Jerold Coombe, NP

## 2019-05-31 NOTE — Patient Instructions (Signed)
Plan de alimentacin DASH DASH Eating Plan DASH es la sigla en ingls de "Enfoques Alimentarios para Detener la Hipertensin" (Dietary Approaches to Stop Hypertension). El plan de alimentacin DASH ha demostrado bajar la presin arterial elevada (hipertensin). Tambin puede reducir el riesgo de diabetes tipo 2, enfermedad cardaca y accidente cerebrovascular. Este plan tambin puede ayudar a adelgazar. Consejos para seguir este plan  Pautas generales  Evite ingerir ms de 2,300 mg (miligramos) de sal (sodio) por da. Si tiene hipertensin, es posible que necesite reducir la ingesta de sodio a 1,500 mg por da.  Limite el consumo de alcohol a no ms de 1medida por da si es mujer y no est embarazada, y 2medidas por da si es hombre. Una medida equivale a 12oz (355ml) de cerveza, 5oz (148ml) de vino o 1oz (44ml) de bebidas alcohlicas de alta graduacin.  Trabaje con su mdico para mantener un peso saludable o perder peso. Pregntele cul es el peso recomendado para usted.  Realice al menos 30 minutos de ejercicio que haga que se acelere su corazn (ejercicio aerbico) la mayora de los das de la semana. Estas actividades pueden incluir caminar, nadar o andar en bicicleta.  Trabaje con su mdico o especialista en alimentacin y nutricin (nutricionista) para ajustar su plan alimentario a sus necesidades calricas personales. Lectura de las etiquetas de los alimentos   Verifique en las etiquetas de los alimentos, la cantidad de sodio por porcin. Elija alimentos con menos del 5 por ciento del valor diario de sodio. Generalmente, los alimentos con menos de 300 mg de sodio por porcin se encuadran dentro de este plan alimentario.  Para encontrar cereales integrales, busque la palabra "integral" como primera palabra en la lista de ingredientes. De compras  Compre productos en los que en su etiqueta diga: "bajo contenido de sodio" o "sin agregado de sal".  Compre alimentos frescos.  Evite los alimentos enlatados y comidas precocidas o congeladas. Coccin  Evite agregar sal cuando cocine. Use hierbas o aderezos sin sal, en lugar de sal de mesa o sal marina. Consulte al mdico o farmacutico antes de usar sustitutos de la sal.  No fra los alimentos. A la hora de cocinar los alimentos opte por hornearlos, hervirlos, grillarlos y asarlos a la parrilla.  Cocine con aceites cardiosaludables, como oliva, canola, soja o girasol. Planificacin de las comidas  Consuma una dieta equilibrada, que incluya lo siguiente: ? 5o ms porciones de frutas y verduras por da. Trate de que la mitad del plato de cada comida sean frutas y verduras. ? Hasta 6 u 8 porciones de cereales integrales por da. ? Menos de 6 onzas de carne, aves o pescado magros por da. Una porcin de 3 onzas de carne tiene casi el mismo tamao que un mazo de cartas. Un huevo equivale a 1 onza. ? Dos porciones de productos lcteos descremados por da. ? Una porcin de frutos secos, semillas o frijoles 5 veces por semana. ? Grasas cardiosaludables. Las grasas saludables llamadas cidos grasos omega-3 se encuentran en alimentos como semillas de lino y pescados de agua fra, como por ejemplo, sardinas, salmn y caballa.  Limite la cantidad que ingiere de los siguientes alimentos: ? Alimentos enlatados o envasados. ? Alimentos con alto contenido de grasa trans, como alimentos fritos. ? Alimentos con alto contenido de grasa saturada, como carne con grasa. ? Dulces, postres, bebidas azucaradas y otros alimentos con azcar agregada. ? Productos lcteos enteros.  No le agregue sal a los alimentos antes de probarlos.  Trate de comer   al menos 2 comidas vegetarianas por semana.  Consuma ms comida casera y menos de restaurante, de bufs y comida rpida.  Cuando coma en un restaurante, pida que preparen su comida con menos sal o, en lo posible, sin nada de sal. Qu alimentos se recomiendan? Los alimentos enumerados a  continuacin no constituyen una lista completa. Hable con el nutricionista sobre las mejores opciones alimenticias para usted. Cereales Pan de salvado o integral. Pasta de salvado o integral. Arroz integral. Avena. Quinua. Trigo burgol. Cereales integrales y con bajo contenido de sodio. Pan pita. Galletitas de agua con bajo contenido de grasa y sodio. Tortillas de harina integral. Verduras Verduras frescas o congeladas (crudas, al vapor, asadas o grilladas). Jugos de tomate y verduras con bajo contenido de sodio o reducidos en sodio. Salsa y pasta de tomate con bajo contenido de sodio o reducidas en sodio. Verduras enlatadas con bajo contenido de sodio o reducidas en sodio. Frutas Todas las frutas frescas, congeladas o disecadas. Frutas enlatadas en jugo natural (sin agregado de azcar). Carne y otros alimentos proteicos Pollo o pavo sin piel. Carne de pollo o de pavo molida. Cerdo desgrasado. Pescado y mariscos. Claras de huevo. Porotos, guisantes o lentejas secos. Frutos secos, mantequilla de frutos secos y semillas sin sal. Frijoles enlatados sin sal. Cortes de carne vacuna magra, desgrasada. Embutidos magros, con bajo contenido de sodio. Lcteos Leche descremada (1%) o descremada. Quesos sin grasa, con bajo contenido de grasa o descremados. Queso blanco o ricota sin grasa, con bajo contenido de sodio. Yogur semidescremado o descremado. Queso con bajo contenido de grasa y sodio. Grasas y aceites Margarinas untables que no contengan grasas trans. Aceite vegetal. [place]a y aderezos para ensaladas livianos o con bajo contenido de grasas (reducidos en sodio). Aceite de canola, crtamo, oliva, soja y girasol. [place]. Condimentos y otros alimentos Hierbas. Especias. Mezclas de condimentos sin sal. Palomitas de maz y pretzels sin sal. Dulces con bajo contenido de grasas. Qu alimentos no se recomiendan? Los alimentos enumerados a continuacin no constituyen una lista completa. Hable con el  nutricionista sobre las mejores opciones alimenticias para usted. Cereales Productos de panificacin hechos con grasa, como medialunas, magdalenas y algunos panes. Comidas con arroz o pasta seca listas para usar. Verduras Verduras con crema o fritas. Verduras en salsa de queso. Verduras enlatadas regulares (que no sean con bajo contenido de sodio o reducidas en sodio). Pasta y salsa de tomates enlatadas regulares (que no sean con bajo contenido de sodio o reducidas en sodio). Jugos de tomate y verduras regulares (que no sean con bajo contenido de sodio o reducidos en sodio). Pepinillos. Aceitunas. Frutas Fruta enlatada en almbar liviano o espeso. Frutas cocidas en aceite. Frutas con salsa de crema o manteca. Carne y otros alimentos proteicos Cortes de carne con grasa. Costillas. Carne frita. Tocino. Salchichas. Mortadela y otras carnes procesadas. Salame. Panceta. Perros calientes (hotdogs). Salchicha de cerdo. Frutos secos y semillas con sal. Frijoles enlatados con agregado de sal. Pescado enlatado o ahumado. Huevos enteros o yemas. Pollo o pavo con piel. Lcteos Leche entera o al 2%, crema y mitad leche y mitad crema. Queso crema entero o con toda su grasa. Yogur entero o endulzado. Quesos con toda su grasa. Sustitutos de cremas no lcteas. Coberturas batidas. Quesos para untar y quesos procesados. Grasas y aceites Mantequilla. Margarina en barra. Manteca de cerdo. Materia grasa. Mantequilla clarificada. Grasa de panceta. Aceites tropicales como aceite de coco, palmiste o palma. Condimentos y otros alimentos Palomitas de maz y pretzels con sal. Sal   de cebolla, sal de ajo, sal condimentada, sal de mesa y sal marina. Salsa [place]. Salsa trtara. Salsa barbacoa. Salsa teriyaki. Salsa de soja, incluso la que tiene contenido reducido de [place]. Salsa de carne. Salsas en lata y envasadas. Salsa de pescado. Salsa de [place]. Salsa rosada. Rbano picante envasado. [place]. Mostaza. Saborizantes y  tiernizantes para carne. Caldo en cubitos. Salsa picante y salsa tabasco. Escabeches envasados o ya preparados. Aderezos para tacos prefabricados o envasados. Salsas. Aderezos comunes para ensalada. Dnde encontrar ms informacin:  [place], los Pulmones y Herbalist (National Heart, Lung, and [place]): https://wilson-eaton.com/  Asociacin Estadounidense del Corazn (American Heart Association): www.heart.org Resumen  El plan de alimentacin DASH ha demostrado bajar la presin arterial elevada (hipertensin). Tambin puede reducir [employer] de diabetes tipo 2, enfermedad cardaca y accidente cerebrovascular.  Con el plan de alimentacin DASH, deber limitar el consumo de sal (sodio) a 2,300 mg por da. Si tiene hipertensin, es posible que necesite reducir la ingesta de sodio a 1,500 mg por da.  Cuando siga el plan de alimentacin DASH, trate de comer ms frutas frescas y verduras, cereales integrales, carnes magras, lcteos descremados y grasas cardiosaludables.  Trabaje con su mdico o especialista en alimentacin y nutricin (nutricionista) para ajustar su plan alimentario a sus necesidades calricas personales. Esta informacin no tiene Marine scientist el consejo del mdico. Asegrese de hacerle al mdico cualquier pregunta que tenga. Document Revised: 06/09/2016 Document Reviewed: 06/09/2016 Elsevier Patient Education  Valliant de carbohidratos para la diabetes mellitus en los adultos Carbohydrate Counting for Diabetes Mellitus, Adult  El recuento de carbohidratos es un mtodo para llevar un registro de la cantidad de carbohidratos que se ingieren. La ingesta natural de carbohidratos aumenta la cantidad de azcar (glucosa) en la sangre. El recuento de la cantidad de carbohidratos que se ingieren sirve para que el nivel de glucosa en sangre permanezca dentro de los lmites [place], lo que ayuda a Theatre manager la diabetes (diabetes mellitus) bajo  control. Es importante saber la cantidad de carbohidratos que se pueden ingerir en cada comida sin correr Engineer, manufacturing. Esto es Psychologist, forensic. Un especialista en alimentacin y nutricin (nutricionista certificado) puede ayudarlo a crear un plan de alimentacin y a calcular la cantidad de carbohidratos que debe ingerir en cada comida y colacin. Los siguientes alimentos incluyen carbohidratos:  Granos, como panes y cereales.  Frijoles secos y productos con soja.  Verduras con almidn, como papas, guisantes y maz.  Lambert Mody y jugos de frutas.  Leche y Estate agent.  Dulces y colaciones, como pasteles, galletas, caramelos, papas fritas de bolsa y refrescos. Cmo se calculan los carbohidratos? Hay dos maneras de calcular los carbohidratos de los alimentos. Puede usar cualquiera de los dos mtodos o [place] combinacin de [place]. Leer la etiqueta de "informacin nutricional" de los alimentos envasados La lista de "informacin nutricional" est incluida en las etiquetas de casi todas las bebidas y los alimentos envasados de los [place]. Incluye lo siguiente:  El tamao de la porcin.  Informacin sobre los nutrientes de cada porcin, incluidos los gramos (g) de carbohidratos por porcin. Para usar la "informacin nutricional":  Decida cuntas porciones va a comer.  Multiplique la cantidad de porciones por el nmero de carbohidratos por porcin.  El resultado es la cantidad total de carbohidratos que comer. Conocer los tamaos de las porciones estndar de otros alimentos Cuando coma alimentos que contengan carbohidratos y que no estn envasados o no incluyan la "informacin nutricional" en  la etiqueta, debe medir las porciones para poder calcular la cantidad de carbohidratos:  Mida los alimentos que comer con una balanza de alimentos o una taza medidora, si es necesario.  Decida cuntas porciones de International aid/development worker.  Multiplique el nmero de porciones por15. La  mayora de los alimentos con alto contenido de carbohidratos contienen unos 15g de carbohidratos por porcin. ? Por ejemplo, si come 8onzas (170g) de fresas, habr comido 2porciones y 30g de carbohidratos (2porciones x 15g=30g).  En el caso de las comidas que contienen mezclas de ms de un alimento, como las sopas y los guisos, debe calcular los carbohidratos de cada alimento que se incluye. La siguiente lista contiene los tamaos de porciones estndar de los alimentos ricos en carbohidratos ms comunes. Cada una de estas porciones tiene aproximadamente 15g de carbohidratos:  pan de hamburguesa o muffin ingls.  onza (28ml) de jarabe.   onza (14g) de mermelada.  1rebanada de pan.  1tortilla de seis pulgadas.  3onzas (85g) de arroz o pasta cocidos.  4onzas (113g) de frijoles secos cocidos.  4onzas (113g) de verduras con almidn, como guisantes, maz o papas.  4onzas (113g) de cereal caliente.  4 onzas (113g) de pur de papas o de una papa grande al horno.  4onzas (113g) de frutas en lata o congeladas.  4onzas (1101ml) de jugo de frutas.  4a 6galletas.  6croquetas de pollo.  6onzas (170g) de cereales secos sin azcar.  6onzas (170g) de yogur descremado sin ningn agregado o de yogur endulzado con edulcorante artificial.  8onzas (252ml) de [place].  8 onzas (170g) de frutas frescas o una fruta pequea.  24 onzas (680g) de palomitas de maz. Ejemplo de recuento de carbohidratos Ejemplo de comida  3 onzas (85g) de pechugas de pollo.  6onzas (170g) de arroz integral.  4onzas (113g) de maz.  8onzas (242ml) de leche.  8onzas (170g) de fresas con crema batida sin azcar. Clculo de carbohidratos 1. Identifique los alimentos que contienen carbohidratos: ? Arroz. ? Maz. ? Leche. ? Hughie Closs. 2. Calcule cuntas porciones come de cada alimento: ? 2 porciones de arroz. ? 1 porcin de maz. ? 1 porcin de  leche. ? 1 porcin de fresas. 3. Multiplique cada nmero de porciones por 15g: ? 2 porciones de arroz x 15 g = 30 g. ? 1 porcin de maz x 15 g = 15 g. ? 1 porcin de leche x 15 g = 15 g. ? 1 porcin de fresas x 15 g = 15 g. 4. Sume todas las cantidades para conocer el total de gramos de carbohidratos consumidos: ? 30g + 15g + 15g + 15g = 75g de carbohidratos en total. Resumen  El recuento de carbohidratos es un mtodo para llevar un registro de la cantidad de carbohidratos que se ingieren.  La ingesta natural de carbohidratos aumenta la cantidad de azcar (glucosa) en la sangre.  El recuento de la cantidad de carbohidratos que se ingieren sirve para Advertising account executive de glucosa en sangre dentro de los lmites [place], lo que ayuda a Theatre manager la diabetes bajo control.  Un especialista en alimentacin y nutricin (nutricionista certificado) puede ayudarlo a crear un plan de alimentacin y a calcular la cantidad de carbohidratos que debe ingerir en cada comida y colacin. Esta informacin no tiene Marine scientist el consejo del mdico. Asegrese de hacerle al mdico cualquier pregunta que tenga. Document Revised: 12/09/2016 Document Reviewed: 08/01/2015 Elsevier Patient Education  [place].

## 2019-06-14 ENCOUNTER — Ambulatory Visit: Payer: Self-pay | Attending: Internal Medicine

## 2019-06-14 DIAGNOSIS — Z23 Encounter for immunization: Secondary | ICD-10-CM

## 2019-06-14 NOTE — Progress Notes (Signed)
   Covid-19 Vaccination Clinic  Name:  Lucas [name]    MRN: [id number] DOB: 1955/09/18  06/14/2019  [name] was observed post Covid-19 immunization for 15 minutes without incident. He was provided with Vaccine Information Sheet and instruction to access the V-Safe system.   [name] was instructed to call 911 with any severe reactions post vaccine: Marland Kitchen Difficulty breathing  . Swelling of face and throat  . A fast heartbeat  . A bad rash all over body  . Dizziness and weakness   Immunizations Administered    Name Date Dose VIS Date Route   Pfizer COVID-19 Vaccine 06/14/2019  9:12 AM 0.3 mL 02/11/2019 Intramuscular   Manufacturer: [place]   Lot: [id number]   [place]: [id number]

## 2019-06-29 NOTE — Progress Notes (Signed)
Patient ID: Lucas [name], male    DOB: 1955/03/11, 64 y.o.   MRN: [id number]  HPI  Entire visit done with [facility] interpreter present  Lucas [name] is a 64 y/o male with a history of STEMI, HTN and heart failure.  Echo report on 05/29/17 reviewed and showed an EF of 40-45% along with mild Lucas. Echo report done 01/26/17 reviewed and shows an EF of 30-35% along with mild Lucas.   Cardiac catheterization done 01/25/17 showed mild to moderate non-obstructive disease involving the mid/distal LAD and RCA. Moderately elevated left ventricular filling pressure. 100% thrombotic occlusion of the proximal LAD. DES was successfully placed.   He hasn't been admitted or been in the ED in the last 6 months.   He presents today with a chief complaint of a follow-up visit. He says that he has minimal cough and headache. He currently denies any difficulty sleeping, dizziness, abdominal distention, palpitations, pedal edema, chest pain, shortness of breath, fatigue or weight gain.   He says that he feels better since carvedilol was increased at last visit.   Past Medical History:  Diagnosis Date  . Acute ST elevation myocardial infarction (STEMI) involving left anterior descending (LAD) coronary artery ([place])    a. 01/2017 Ant STEMI/PCI: LAD 20ost, 100p (3.0x23 Xience Sierra DES).  . CAD (coronary artery disease)    a. 01/2017 Ant STEMI/PCI: LM 20d, LAD 20ost, 100p (3.0x23 Xience Sierra DES), 79m, D1 50ost, LCX nl, RCA 30p; b. 11/2017 MV: Intermediate risk w/ 37mm inf ST dep; c. 11/2017 Cath: LM nl, LAD patent prox stent, D1 20ost, LCX nl, RCA 30p. EF 45-50%.  . Chronic combined systolic (congestive) and diastolic (congestive) heart failure ([place])    a. 01/2018 Echo: EF 30-35%, sev antsept, ant, apical HK. Gr1 DD, mild Lucas.  . Essential hypertension   . Hyperlipidemia    a. Myalgias with high dose lipitor - tolerating 40mg  daily.  . Ischemic cardiomyopathy    a. 01/2018 Echo: EF 30-35%.  . Type II  diabetes mellitus ([place])    Past Surgical History:  Procedure Laterality Date  . APPENDECTOMY    . CARDIAC CATHETERIZATION    . CORONARY ANGIOPLASTY    . CORONARY/GRAFT ACUTE MI REVASCULARIZATION N/A 01/25/2017   Procedure: Coronary/Graft Acute MI Revascularization;  Surgeon: Nelva Bush, MD;  Location: [place] [facility];  Service: Cardiovascular;  Laterality: N/A;  . LEFT HEART CATH AND CORONARY ANGIOGRAPHY N/A 01/25/2017   Procedure: LEFT HEART CATH AND CORONARY ANGIOGRAPHY;  Surgeon: Nelva Bush, MD;  Location: [place] [facility];  Service: Cardiovascular;  Laterality: N/A;  . LEFT HEART CATH AND CORONARY ANGIOGRAPHY N/A 11/20/2017   Procedure: LEFT HEART CATH AND CORONARY ANGIOGRAPHY;  Surgeon: Wellington Hampshire, MD;  Location: [place] [facility];  Service: Cardiovascular;  Laterality: N/A;   Family History  Problem Relation Age of Onset  . Heart disease Father        s/p pacemaker  . Arrhythmia Father   . Asthma Father   . Asthma Mother    Social History   Tobacco Use  . Smoking status: Never Smoker  . Smokeless tobacco: Never Used  Substance Use Topics  . Alcohol use: No   No Known Allergies  Prior to Admission medications   Medication Sig Start Date End Date Taking? Authorizing Provider  aspirin 81 MG chewable tablet Take one pill by mouth every day. 05/31/19  Yes Iloabachie, Chioma E, NP  atorvastatin (LIPITOR) 20 MG tablet Take 0.5 tablets (10  mg total) by mouth daily. 05/31/19  Yes Iloabachie, Chioma E, NP  carvedilol (COREG) 12.5 MG tablet Take 1 tablet (12.5 mg total) by mouth 2 (two) times daily. 05/27/19 08/25/19 Yes Darylene Price A, FNP  clopidogrel (PLAVIX) 75 MG tablet Take 1 tablet (75 mg total) by mouth daily. 05/11/19  Yes Iloabachie, Chioma E, NP  metFORMIN (GLUCOPHAGE) 500 MG tablet Take 1 tablet (500 mg total) by mouth daily with breakfast. 05/31/19  Yes Iloabachie, Chioma E, NP  sacubitril-valsartan (ENTRESTO) 24-26 MG Take 1 tablet by mouth 2  (two) times daily. 03/31/19  Yes Lanae Boast, FNP  spironolactone (ALDACTONE) 25 MG tablet Take 0.5 tablets (12.5 mg total) by mouth daily. 05/11/19  Yes Iloabachie, Chioma E, NP     Review of Systems  Constitutional: Negative for appetite change and fatigue.  HENT: Negative for congestion, postnasal drip and sore throat.   Eyes: Negative.   Respiratory: Positive for cough ("very little"). Negative for chest tightness and shortness of breath.   Cardiovascular: Negative for chest pain, palpitations and leg swelling.  Gastrointestinal: Negative for abdominal distention and abdominal pain.  Endocrine: Negative.   Genitourinary: Negative.   Musculoskeletal: Negative for [name] pain and neck pain.  Skin: Negative.   Allergic/Immunologic: Negative.   Neurological: Positive for headaches (at times). Negative for dizziness, weakness and light-headedness.  Hematological: Negative for adenopathy. Does not bruise/bleed easily.  Psychiatric/Behavioral: Negative for dysphoric mood and sleep disturbance (sleeping on 2 pillows). The patient is not nervous/anxious.    Vitals:   06/30/19 0856 06/30/19 0905  BP: 114/79   Pulse: (!) 40 72  Resp: 18   SpO2: 97%   Weight: 148 lb 4 oz (67.2 kg)   Height: 5' (1.524 m)    Wt Readings from Last 3 Encounters:  06/30/19 148 lb 4 oz (67.2 kg)  05/31/19 150 lb 8 oz (68.3 kg)  05/27/19 151 lb 2 oz (68.5 kg)   Lab Results  Component Value Date   CREATININE 0.83 05/11/2019   CREATININE 0.87 08/19/2018   CREATININE 0.84 02/16/2018     Physical Exam  Constitutional: He is oriented to person, place, and time. He appears well-developed and well-nourished.  HENT:  Head: Normocephalic and atraumatic.  Neck: No JVD present.  Cardiovascular: Normal rate and regular rhythm.  Pulmonary/Chest: Effort normal. No respiratory distress. He has no wheezes. He has no rales.  Abdominal: Soft. He exhibits no distension. There is no abdominal tenderness.   Musculoskeletal:        General: No tenderness or edema.     Cervical [name]: Normal range of motion and neck supple.  Neurological: He is alert and oriented to person, place, and time.  Skin: Skin is warm and dry.  Psychiatric: He has a normal mood and affect. His behavior is normal. Thought content normal.  Nursing note and vitals reviewed.  Assessment & Plan:  1: Chronic heart failure with reduced ejection fraction- - NYHA class I - euvolemic today - weighing daily. Reminded to call for an overnight weight gain of >2 pounds or a weekly weight gain of >5 pounds - weight down 3 pounds from last visit here 1 month ago - saw cardiologist (End) 09/01/2018 - not adding salt; reminded to closely follow a 2000mg  sodium diet. Can use Mrs Deliah Boston for seasoning - carvedilol increased to 12.5mg  BID at his last visit; will not titrate today; initial HR 40 but then when checked manually, it was 72 - unable to titrate entresto due to BP -  could increase spironolactone and/or add farxiga in the future  2: HTN-  - BP looks good today - went to [facility] 05/31/19 - BMP from 05/11/19 reviewed and showed sodium 139, potassium 4.2, creatinine 0.83 and GFR 95   Medication bottles were reviewed.   Return in 3 months or sooner for any questions/problems before then.

## 2019-06-30 ENCOUNTER — Other Ambulatory Visit: Payer: Self-pay | Admitting: Family

## 2019-06-30 ENCOUNTER — Ambulatory Visit: Payer: Self-pay | Attending: Family | Admitting: Family

## 2019-06-30 ENCOUNTER — Telehealth: Payer: Self-pay | Admitting: Family

## 2019-06-30 ENCOUNTER — Other Ambulatory Visit: Payer: Self-pay

## 2019-06-30 ENCOUNTER — Encounter: Payer: Self-pay | Admitting: Family

## 2019-06-30 VITALS — BP 114/79 | HR 72 | Resp 18 | Ht 60.0 in | Wt 148.2 lb

## 2019-06-30 DIAGNOSIS — E785 Hyperlipidemia, unspecified: Secondary | ICD-10-CM | POA: Insufficient documentation

## 2019-06-30 DIAGNOSIS — Z7982 Long term (current) use of aspirin: Secondary | ICD-10-CM | POA: Insufficient documentation

## 2019-06-30 DIAGNOSIS — Z7984 Long term (current) use of oral hypoglycemic drugs: Secondary | ICD-10-CM | POA: Insufficient documentation

## 2019-06-30 DIAGNOSIS — Z955 Presence of coronary angioplasty implant and graft: Secondary | ICD-10-CM | POA: Insufficient documentation

## 2019-06-30 DIAGNOSIS — I5022 Chronic systolic (congestive) heart failure: Secondary | ICD-10-CM

## 2019-06-30 DIAGNOSIS — E119 Type 2 diabetes mellitus without complications: Secondary | ICD-10-CM | POA: Insufficient documentation

## 2019-06-30 DIAGNOSIS — Z79899 Other long term (current) drug therapy: Secondary | ICD-10-CM | POA: Insufficient documentation

## 2019-06-30 DIAGNOSIS — I255 Ischemic cardiomyopathy: Secondary | ICD-10-CM | POA: Insufficient documentation

## 2019-06-30 DIAGNOSIS — I10 Essential (primary) hypertension: Secondary | ICD-10-CM

## 2019-06-30 DIAGNOSIS — I5042 Chronic combined systolic (congestive) and diastolic (congestive) heart failure: Secondary | ICD-10-CM | POA: Insufficient documentation

## 2019-06-30 DIAGNOSIS — I2510 Atherosclerotic heart disease of native coronary artery without angina pectoris: Secondary | ICD-10-CM | POA: Insufficient documentation

## 2019-06-30 DIAGNOSIS — I252 Old myocardial infarction: Secondary | ICD-10-CM | POA: Insufficient documentation

## 2019-06-30 DIAGNOSIS — R519 Headache, unspecified: Secondary | ICD-10-CM | POA: Insufficient documentation

## 2019-06-30 DIAGNOSIS — Z8249 Family history of ischemic heart disease and other diseases of the circulatory system: Secondary | ICD-10-CM | POA: Insufficient documentation

## 2019-06-30 DIAGNOSIS — I110 Hypertensive heart disease with heart failure: Secondary | ICD-10-CM | POA: Insufficient documentation

## 2019-06-30 NOTE — Telephone Encounter (Signed)
Contacted patient's cardiology (End) regarding continued clopidogrel use post DES in 2018. He has been taking clopidogrel 75mg  daily and aspirin 81mg  daily since then.   Dr. Saunders Revel says that he can stop his clopidogrel and continue aspirin 81mg  indefinitely. Have advised interpreter to call patient and tell him that he can finish his current bottle of clopidogrel but that he doesn't have to get it refilled anymore.

## 2019-06-30 NOTE — Patient Instructions (Signed)
Continue weighing daily and call for an overnight weight gain of > 2 pounds or a weekly weight gain of >5 pounds. 

## 2019-07-21 ENCOUNTER — Encounter: Payer: Self-pay | Admitting: Family

## 2019-07-21 ENCOUNTER — Other Ambulatory Visit: Payer: Self-pay

## 2019-07-21 ENCOUNTER — Ambulatory Visit (INDEPENDENT_AMBULATORY_CARE_PROVIDER_SITE_OTHER): Payer: Self-pay | Admitting: Family

## 2019-07-21 VITALS — BP 116/76 | HR 64 | Ht 60.0 in | Wt 149.0 lb

## 2019-07-21 DIAGNOSIS — I5022 Chronic systolic (congestive) heart failure: Secondary | ICD-10-CM

## 2019-07-21 DIAGNOSIS — I25118 Atherosclerotic heart disease of native coronary artery with other forms of angina pectoris: Secondary | ICD-10-CM

## 2019-07-21 DIAGNOSIS — I10 Essential (primary) hypertension: Secondary | ICD-10-CM

## 2019-07-21 DIAGNOSIS — I255 Ischemic cardiomyopathy: Secondary | ICD-10-CM

## 2019-07-21 NOTE — Progress Notes (Signed)
Office Visit    Patient Name: Lucas [name] Date of Encounter: 07/21/2019  Primary Care Provider:  [name], MD Primary Cardiologist:  Nelva Bush, MD Electrophysiologist:  None   Chief Complaint    Lucas [name] is a 64 y.o. male with a hx of CAD s/p late presenting anterior STEMI (11/19) s/p PCI to proximal LAD, ischemic cardiomyopathy, chronic systolic heart failure, borderline diabetes mellitus presents today for follow-up of CAD and heart failure  Past Medical History    Past Medical History:  Diagnosis Date  . Acute ST elevation myocardial infarction (STEMI) involving left anterior descending (LAD) coronary artery ([place])    a. 01/2017 Ant STEMI/PCI: LAD 20ost, 100p (3.0x23 Xience Sierra DES).  . CAD (coronary artery disease)    a. 01/2017 Ant STEMI/PCI: LM 20d, LAD 20ost, 100p (3.0x23 Xience Sierra DES), 24m, D1 50ost, LCX nl, RCA 30p; b. 11/2017 MV: Intermediate risk w/ 41mm inf ST dep; c. 11/2017 Cath: LM nl, LAD patent prox stent, D1 20ost, LCX nl, RCA 30p. EF 45-50%.  . Chronic combined systolic (congestive) and diastolic (congestive) heart failure ([place])    a. 01/2018 Echo: EF 30-35%, sev antsept, ant, apical HK. Gr1 DD, mild MR.  . Essential hypertension   . Hyperlipidemia    a. Myalgias with high dose lipitor - tolerating 40mg  daily.  . Ischemic cardiomyopathy    a. 01/2018 Echo: EF 30-35%.  . Type II diabetes mellitus ([place])    Past Surgical History:  Procedure Laterality Date  . APPENDECTOMY    . CARDIAC CATHETERIZATION    . CORONARY ANGIOPLASTY    . CORONARY/GRAFT ACUTE MI REVASCULARIZATION N/A 01/25/2017   Procedure: Coronary/Graft Acute MI Revascularization;  Surgeon: Nelva Bush, MD;  Location: [facility];  Service: Cardiovascular;  Laterality: N/A;  . LEFT HEART CATH AND CORONARY ANGIOGRAPHY N/A 01/25/2017   Procedure: LEFT HEART CATH AND CORONARY ANGIOGRAPHY;  Surgeon: Nelva Bush, MD;  Location: [facility];  Service: Cardiovascular;  Laterality: N/A;  . LEFT HEART CATH AND CORONARY ANGIOGRAPHY N/A 11/20/2017   Procedure: LEFT HEART CATH AND CORONARY ANGIOGRAPHY;  Surgeon: Wellington Hampshire, MD;  Location: [facility];  Service: Cardiovascular;  Laterality: N/A;    Allergies  No Known Allergies  History of Present Illness    Lucas [name] is a 64 y.o. male with a hx of CAD s/p late presenting anterior STEMI (11/19) s/p PCI to proximal LAD, ischemic cardiomyopathy, chronic systolic heart failure, borderline diabetes mellitus.  He was last seen by Darylene Price, NP 06/30/2019.  CAD is s/p late presenting anterior STEMI in November 2018 with PCI to proximal LAD.  Repeat left heart cath for chest pain September 2019 with widely patent LAD stent with mild disease involving D1.  Most recent echo 05/2017 LVEF 40-45%, wall motion abnormalities, grade 1 diastolic dysfunction, mild MR.  Visit today completed with the assistance of an interpreter.  Reports no shortness of breath nor dyspnea on exertion. Reports no chest pain, pressure, or tightness. No edema, orthopnea, PND. Reports no palpitations.  Reports no lightheadedness, dizziness, near syncope.  Endorses walking for exercise.  Reports no dyspnea while he is walking.  Does note that sometimes he will get weak if he walks a further distance than usual or walks too fast.  EKGs/Labs/Other Studies Reviewed:   The following studies were reviewed today:  LHC (11/20/17): LMCA normal.  LAD with widely patent proximal stent.  20% ostial D1 stenosis.  30% proximal RCA stenosis.  LCx normal.  LVEF 45% with distal anterior hypokinesis.  LVEDP 16 to 20 mmHg. LHC/PCI (01/25/17): LMCA with 20% distal stenosis.  LAD with 20% ostial disease followed by thrombotic occlusion of the proximal vessel.  25% mid LAD disease and 50% ostial D1 stenosis present.  LCx normal.  RCA with 30% proximal stenosis.  Successful PCI to the proximal LAD  with placement of a Xience Sierra 3.0 x 23 mm drug-eluting stent postdilated with a 3.5 mm [place] balloon at high pressure   Exercise tolerance test (11/06/17): Abnormal, intermediate risk study with mild ST depression during stress and neck/jaw pain, concerning for anginal equivalent.  [facility] treadmill score equals 0).  TTE (05/29/2017): Normal LV size.  LVEF 40-45% with anterior, anteroseptal, and apical hypokinesis and grade 1 diastolic dysfunction.  Mild MR.  Normal RV size and function.  Normal pulmonary artery pressure.  TTE (01/26/17): Normal LV size with moderate LVH.  LVEF 30-35% with severe anterior, anteroseptal, and apical hypokinesis.  Grade 1 diastolic dysfunction.  Mild MR.  Normal RV size and function.  EKG:  EKG is ordered today.  The ekg ordered today demonstrates normal sinus rhythm 64 bpm with stable T wave inversion in lead V1 V2.  Recent Labs: 08/19/2018: Hemoglobin 15.5; Platelets 209; TSH 3.160 05/11/2019: ALT 14; BUN 13; Creatinine, Ser 0.83; Potassium 4.2; Sodium 139  Recent Lipid Panel    Component Value Date/Time   CHOL 105 05/11/2019 0931   TRIG 185 (H) 05/11/2019 0931   HDL 34 (L) 05/11/2019 0931   CHOLHDL 3.1 08/19/2018 1706   CHOLHDL 2.8 05/11/2017 1526   VLDL 23 05/11/2017 1526   LDLCALC 41 05/11/2019 0931   LDLDIRECT 33 02/16/2018 0807    Home Medications   Current Meds  Medication Sig  . aspirin 81 MG chewable tablet Take one pill by mouth every day.  Marland Kitchen atorvastatin (LIPITOR) 20 MG tablet Take 0.5 tablets (10 mg total) by mouth daily.  . carvedilol (COREG) 12.5 MG tablet Take 1 tablet (12.5 mg total) by mouth 2 (two) times daily.  . clopidogrel (PLAVIX) 75 MG tablet Take 75 mg by mouth daily.  . metFORMIN (GLUCOPHAGE) 500 MG tablet Take 1 tablet (500 mg total) by mouth daily with breakfast.  . sacubitril-valsartan (ENTRESTO) 24-26 MG Take 1 tablet by mouth 2 (two) times daily.  Marland Kitchen spironolactone (ALDACTONE) 25 MG tablet Take 0.5 tablets (12.5 mg total) by  mouth daily.   Review of Systems      Review of Systems  Constitution: Negative for chills, fever and malaise/fatigue.  Cardiovascular: Negative for chest pain, dyspnea on exertion, leg swelling, near-syncope, orthopnea, palpitations and syncope.  Respiratory: Negative for cough, shortness of breath and wheezing.   Gastrointestinal: Negative for nausea and vomiting.  Neurological: Negative for dizziness, light-headedness and weakness.   All other systems reviewed and are otherwise negative except as noted above.  Physical Exam    VS:  BP 116/76 (BP Location: Left Arm, Patient Position: Sitting, Cuff Size: Normal)   Pulse 64   Ht 5' (1.524 m)   Wt 149 lb (67.6 kg)   SpO2 97%   BMI 29.10 kg/m  , BMI Body mass index is 29.1 kg/m. GEN: Well nourished, well developed, in no acute distress. HEENT: normal. Neck: Supple, no JVD, carotid bruits, or masses. Cardiac: RRR, no murmurs, rubs, or gallops. No clubbing, cyanosis, edema.  Radials/PT 2+ and equal bilaterally.  Respiratory:  Respirations regular and unlabored, clear to auscultation bilaterally. GI:  Soft, nontender, nondistended MS: No deformity or atrophy. Skin: Warm and dry, no rash. Neuro:  Strength and sensation are intact. Psych: Normal affect.  Assessment & Plan    1. CAD -stable with no anginal symptoms.  No evidence of ischemic evaluation this time.  GDMT includes aspirin, beta-blocker, statin.  He is greater than 1 year from his stent and will discontinue Plavix after finishing his current supply.  Denies bleeding complications.  EKG today shows normal sinus rhythm stable T wave inversion in lead V1 V2.  2. HLD, LDL goal less than 70 -05/2019 LDL 41.  Lipid control is at goal.  Continue atorvastatin 10 mg daily.  3. Chronic systolic heart failure due to ischemic cardiomyopathy - euvolemic and well compensated on exam.  Follows closely with heart failure clinic.  GDMT includes beta-blocker, Entresto, MRA.  No indication for  loop diuretic at this time.  Low normal blood pressure precludes further escalation of heart failure therapies.  Disposition: Follow up in 6 month(s) with Dr. Saunders Revel or APP   Loel Dubonnet, NP 07/21/2019, 9:07 AM

## 2019-07-21 NOTE — Patient Instructions (Addendum)
Medication Instructions: ([place])  Continue your current medications.   No cambios hoy. Continua las mismas medicaciones.   *If you need a refill on your cardiac medications before your next appointment, please call your pharmacy*   Lab Work: Jerry Caras) None ordered today. Labs in March were good!  No laboratorios hoy. Los laboratorios en [facility] buenos!   Testing/Procedures: EKG today showed normal heart rhythm.  EKG hoy es normal.  Follow-Up: ([place]) At [facility], you and your health needs are our priority.  As part of our continuing mission to provide you with exceptional heart care, we have created designated Provider Care Teams.  These Care Teams include your primary Cardiologist (physician) and Advanced Practice Providers (APPs -  Physician Assistants and Nurse Practitioners) who all work together to provide you with the care you need, when you need it.  We recommend signing up for the patient portal called "MyChart".  Sign up information is provided on this After Visit Summary.  MyChart is used to connect with patients for Virtual Visits (Telemedicine).  Patients are able to view lab/test results, encounter notes, upcoming appointments, etc.  Non-urgent messages can be sent to your provider as well.   To learn more about what you can do with MyChart, go to [email or web address].    Your next appointment:  ([place]) In July with Darylene Price, NP. In 6 months with Dr. Saunders Revel.   29 de Julio con Darylene Price, NP.  En seis meses con Dr. Saunders Revel.

## 2019-08-10 ENCOUNTER — Telehealth: Payer: Self-pay | Admitting: Pharmacy Technician

## 2019-08-10 NOTE — Telephone Encounter (Signed)
Received updated proof of income.  Patient eligible to receive medication assistance at [facility] until time for re-certification in 9359, and as long as eligibility requirements continue to be met.  [facility]

## 2019-08-13 IMAGING — CR DG CHEST 2V
1 series · 2 of 2 positions shown · non-contrast
Comparison: 01/25/2017

CLINICAL DATA: Chest pain

EXAM:
CHEST - 2 VIEW

[Series 1: dg chest 2 view · 0.14mm/px · 2 of 2 slices shown]
[im 1/2]
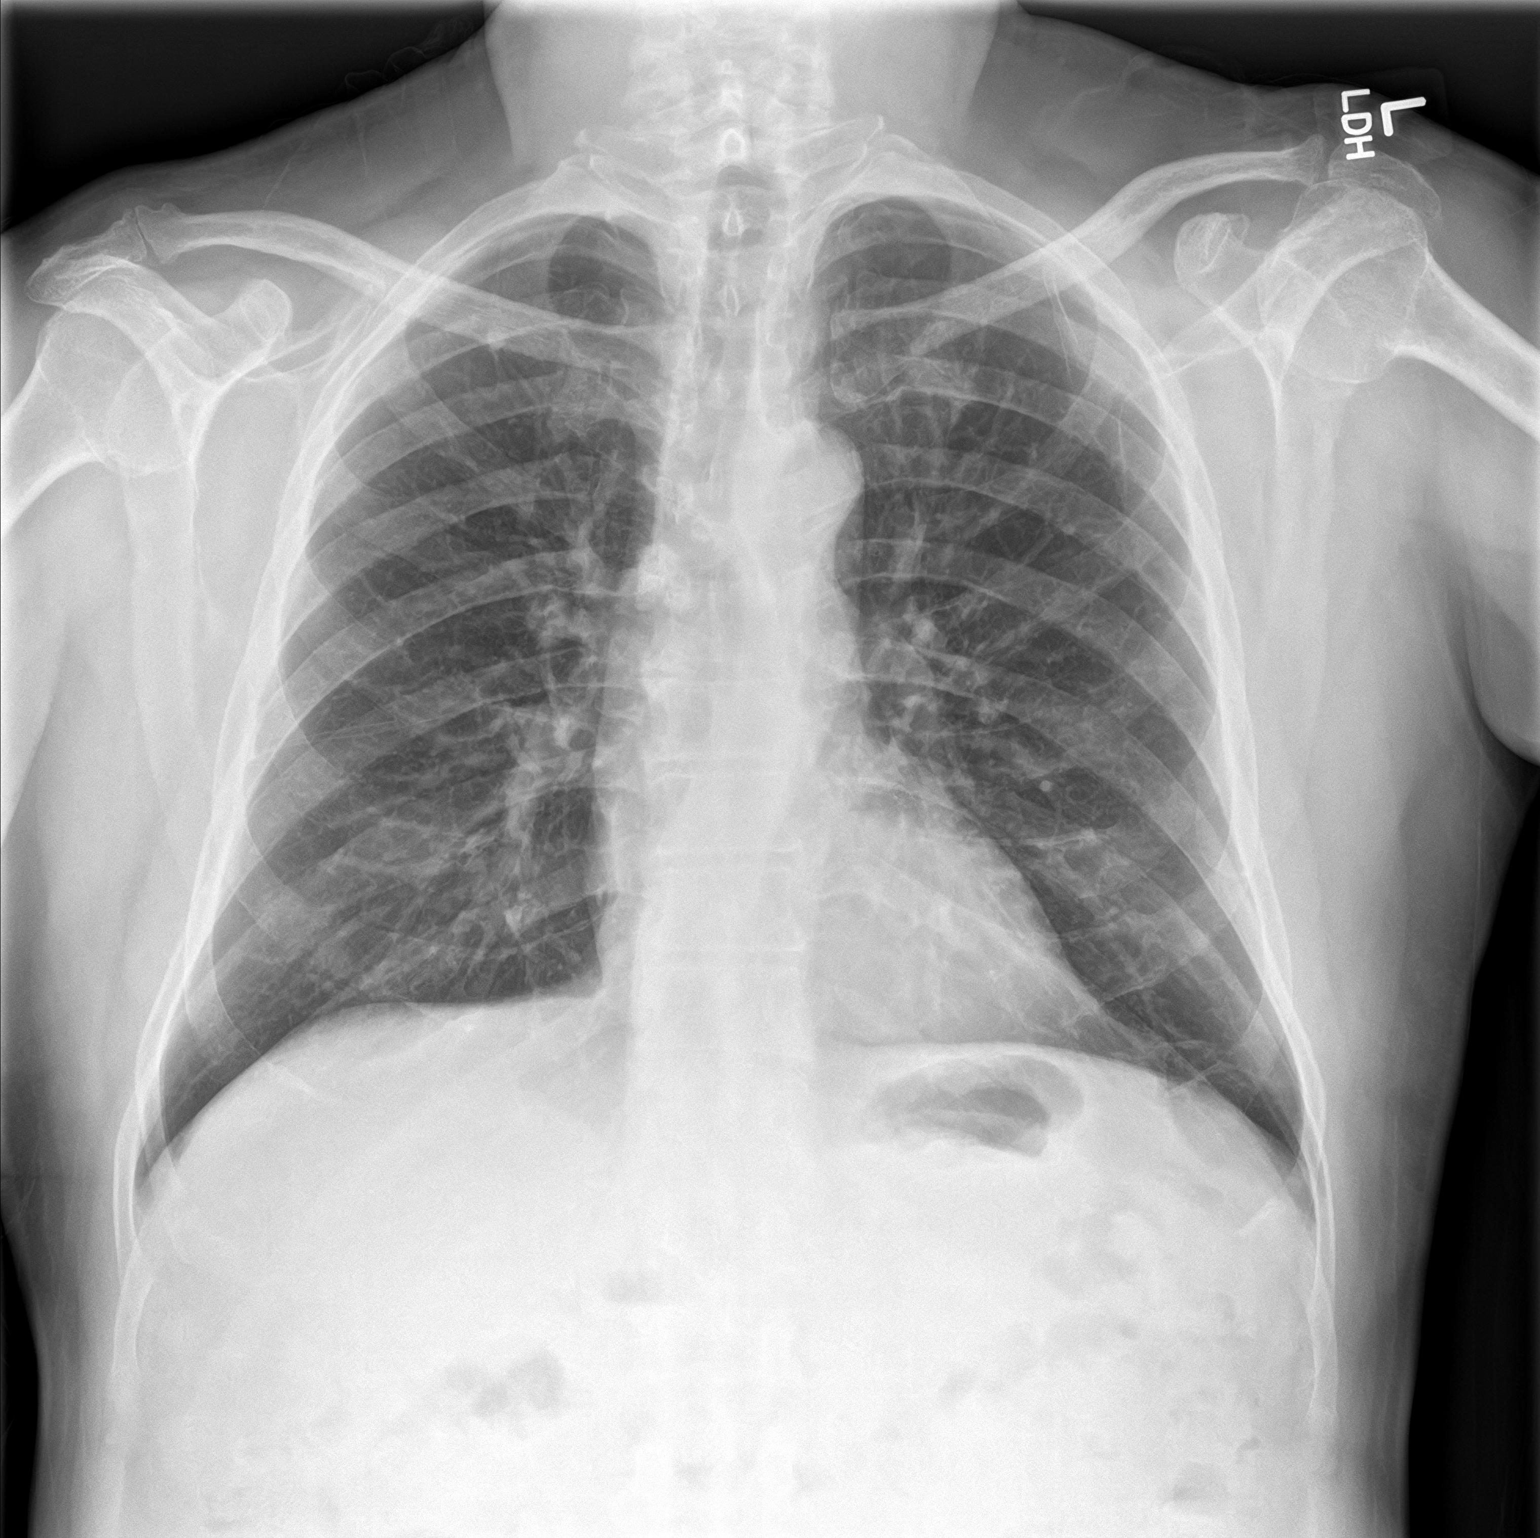
[im 2/2]
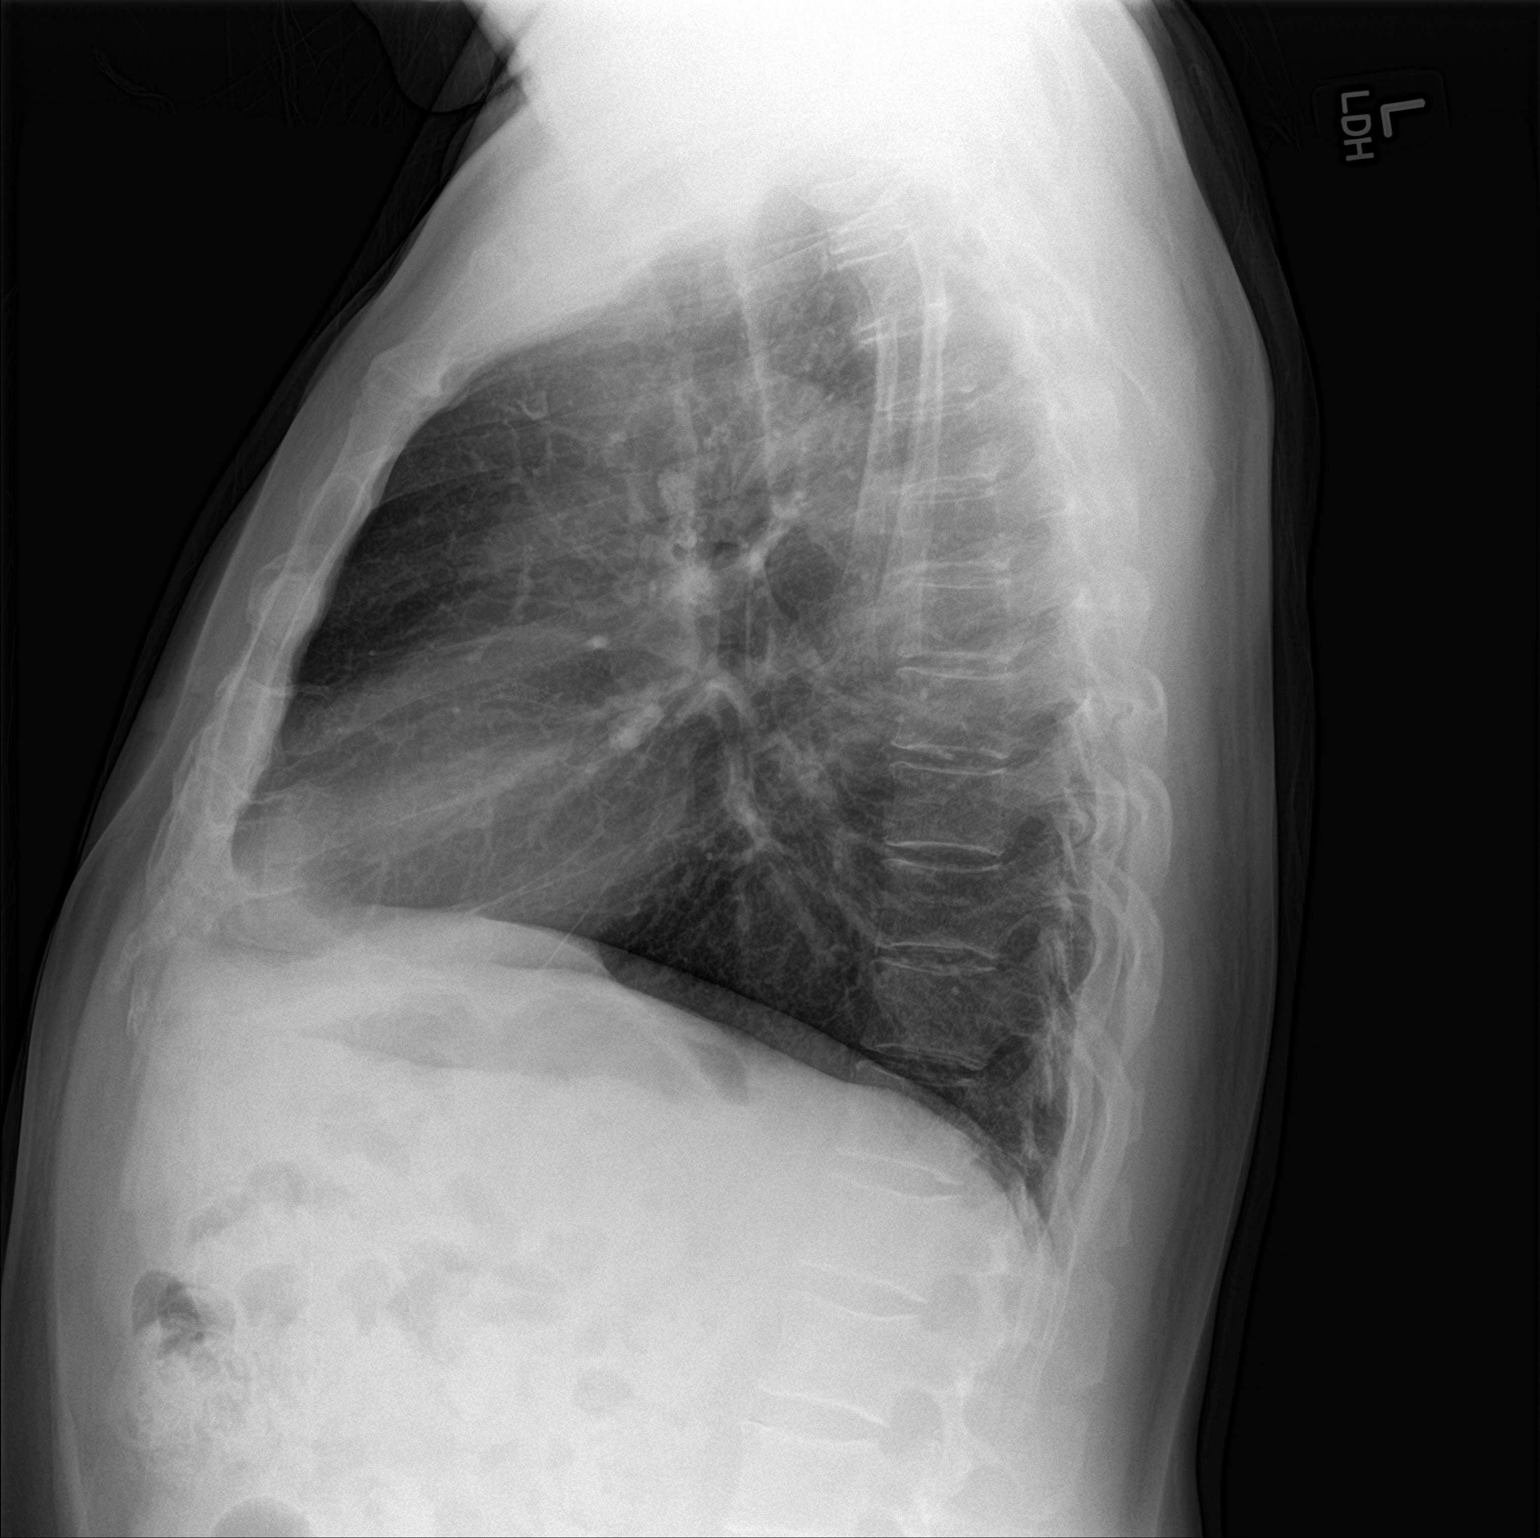

[2 of 2 positions shown; findings below may reference images not displayed]

FINDINGS: The heart size and mediastinal contours are within normal limits.
Both lungs are clear. The visualized skeletal structures are
unremarkable.
IMPRESSION: Clear lungs.

## 2019-09-28 NOTE — Progress Notes (Deleted)
Patient ID: Lucas [name], male    DOB: February 18, 1956, 64 y.o.   MRN: [id number]  HPI  Entire visit done with [facility] interpreter present  [name] is a 64 y/o male with a history of STEMI, HTN and heart failure.  Echo report on 05/29/17 reviewed and showed an EF of 40-45% along with mild MR. Echo report done 01/26/17 reviewed and shows an EF of 30-35% along with mild MR.   Cardiac catheterization done 01/25/17 showed mild to moderate non-obstructive disease involving the mid/distal LAD and RCA. Moderately elevated left ventricular filling pressure. 100% thrombotic occlusion of the proximal LAD. DES was successfully placed.   He hasn't been admitted or been in the ED in the last 6 months.   He presents today with a chief complaint of a follow-up visit.   Past Medical History:  Diagnosis Date  . Acute ST elevation myocardial infarction (STEMI) involving left anterior descending (LAD) coronary artery ([place])    a. 01/2017 Ant STEMI/PCI: LAD 20ost, 100p (3.0x23 Xience Sierra DES).  . CAD (coronary artery disease)    a. 01/2017 Ant STEMI/PCI: LM 20d, LAD 20ost, 100p (3.0x23 Xience Sierra DES), 43m, D1 50ost, LCX nl, RCA 30p; b. 11/2017 MV: Intermediate risk w/ 57mm inf ST dep; c. 11/2017 Cath: LM nl, LAD patent prox stent, D1 20ost, LCX nl, RCA 30p. EF 45-50%.  . Chronic combined systolic (congestive) and diastolic (congestive) heart failure ([place])    a. 01/2018 Echo: EF 30-35%, sev antsept, ant, apical HK. Gr1 DD, mild MR.  . Essential hypertension   . Hyperlipidemia    a. Myalgias with high dose lipitor - tolerating 40mg  daily.  . Ischemic cardiomyopathy    a. 01/2018 Echo: EF 30-35%.  . Type II diabetes mellitus ([place])    Past Surgical History:  Procedure Laterality Date  . APPENDECTOMY    . CARDIAC CATHETERIZATION    . CORONARY ANGIOPLASTY    . CORONARY/GRAFT ACUTE MI REVASCULARIZATION N/A 01/25/2017   Procedure: Coronary/Graft Acute MI Revascularization;  Surgeon: Nelva Bush, MD;  Location: [place] [facility];  Service: Cardiovascular;  Laterality: N/A;  . LEFT HEART CATH AND CORONARY ANGIOGRAPHY N/A 01/25/2017   Procedure: LEFT HEART CATH AND CORONARY ANGIOGRAPHY;  Surgeon: Nelva Bush, MD;  Location: [place];  Service: Cardiovascular;  Laterality: N/A;  . LEFT HEART CATH AND CORONARY ANGIOGRAPHY N/A 11/20/2017   Procedure: LEFT HEART CATH AND CORONARY ANGIOGRAPHY;  Surgeon: Wellington Hampshire, MD;  Location: [facility];  Service: Cardiovascular;  Laterality: N/A;   Family History  Problem Relation Age of Onset  . Heart disease Father        s/p pacemaker  . Arrhythmia Father   . Asthma Father   . Asthma Mother    Social History   Tobacco Use  . Smoking status: Never Smoker  . Smokeless tobacco: Never Used  Substance Use Topics  . Alcohol use: No   No Known Allergies     Review of Systems  Constitutional: Negative for appetite change and fatigue.  HENT: Negative for congestion, postnasal drip and sore throat.   Eyes: Negative.   Respiratory: Positive for cough ("very little"). Negative for chest tightness and shortness of breath.   Cardiovascular: Negative for chest pain, palpitations and leg swelling.  Gastrointestinal: Negative for abdominal distention and abdominal pain.  Endocrine: Negative.   Genitourinary: Negative.   Musculoskeletal: Negative for [name] pain and neck pain.  Skin: Negative.   Allergic/Immunologic: Negative.  Neurological: Positive for headaches (at times). Negative for dizziness, weakness and light-headedness.  Hematological: Negative for adenopathy. Does not bruise/bleed easily.  Psychiatric/Behavioral: Negative for dysphoric mood and sleep disturbance (sleeping on 2 pillows). The patient is not nervous/anxious.       Physical Exam Vitals and nursing note reviewed.  Constitutional:      Appearance: He is well-developed.  HENT:     Head: Normocephalic and atraumatic.  Neck:      Vascular: No JVD.  Cardiovascular:     Rate and Rhythm: Normal rate and regular rhythm.  Pulmonary:     Effort: Pulmonary effort is normal. No respiratory distress.     Breath sounds: No wheezing or rales.  Abdominal:     General: There is no distension.     Palpations: Abdomen is soft.     Tenderness: There is no abdominal tenderness.  Musculoskeletal:        General: No tenderness.     Cervical [name]: Normal range of motion and neck supple.  Skin:    General: Skin is warm and dry.  Neurological:     Mental Status: He is alert and oriented to person, place, and time.  Psychiatric:        Behavior: Behavior normal.        Thought Content: Thought content normal.    Assessment & Plan:  1: Chronic heart failure with reduced ejection fraction- - NYHA class I - euvolemic today - weighing daily. Reminded to call for an overnight weight gain of >2 pounds or a weekly weight gain of >5 pounds - weight 148.4 pounds from last visit here 3 months ago - saw cardiologist Gilford Rile) 07/21/19 - not adding salt; reminded to closely follow a 2000mg  sodium diet. Can use Mrs Deliah Boston for seasoning - carvedilol increased to 12.5mg  BID at his last visit; will not titrate today; initial HR 40 but then when checked manually, it was 72 - unable to titrate entresto due to BP - could increase spironolactone and/or add farxiga in the future  2: HTN-  - BP  - went to [facility] 05/31/19 - BMP from 05/11/19 reviewed and showed sodium 139, potassium 4.2, creatinine 0.83 and GFR 95   Medication bottles were reviewed.

## 2019-09-29 ENCOUNTER — Ambulatory Visit: Payer: Self-pay | Admitting: Family

## 2019-09-29 ENCOUNTER — Telehealth: Payer: Self-pay | Admitting: Family

## 2019-09-29 NOTE — Telephone Encounter (Signed)
Patient did not show for his Heart Failure Clinic appointment on 09/29/19. Will attempt to reschedule.

## 2019-10-05 ENCOUNTER — Other Ambulatory Visit: Payer: Self-pay

## 2019-10-11 ENCOUNTER — Ambulatory Visit: Payer: Self-pay | Admitting: Gerontology

## 2019-11-01 ENCOUNTER — Ambulatory Visit: Payer: Self-pay | Admitting: Gerontology

## 2019-11-02 ENCOUNTER — Other Ambulatory Visit: Payer: Self-pay

## 2019-11-02 DIAGNOSIS — R7303 Prediabetes: Secondary | ICD-10-CM

## 2019-11-02 DIAGNOSIS — I10 Essential (primary) hypertension: Secondary | ICD-10-CM

## 2019-11-03 LAB — SPECIMEN STATUS REPORT

## 2019-11-04 LAB — LIPID PANEL
Chol/HDL Ratio: 4.8 ratio (ref 0.0–5.0)
Cholesterol, Total: 145 mg/dL (ref 100–199)
HDL: 30 mg/dL — ABNORMAL LOW
LDL Chol Calc (NIH): 79 mg/dL (ref 0–99)
Triglycerides: 215 mg/dL — ABNORMAL HIGH (ref 0–149)
VLDL Cholesterol Cal: 36 mg/dL (ref 5–40)

## 2019-11-04 LAB — HEMOGLOBIN A1C
Est. average glucose Bld gHb Est-mCnc: 131 mg/dL
Hgb A1c MFr Bld: 6.2 % — ABNORMAL HIGH (ref 4.8–5.6)

## 2019-11-15 ENCOUNTER — Ambulatory Visit: Payer: Self-pay | Admitting: Gerontology

## 2019-11-16 ENCOUNTER — Other Ambulatory Visit: Payer: Self-pay

## 2019-11-16 ENCOUNTER — Encounter: Payer: Self-pay | Admitting: Gerontology

## 2019-11-16 ENCOUNTER — Other Ambulatory Visit: Payer: Self-pay | Admitting: Gerontology

## 2019-11-16 ENCOUNTER — Ambulatory Visit: Payer: Self-pay | Admitting: Gerontology

## 2019-11-16 VITALS — BP 127/81 | HR 65 | Ht 60.0 in | Wt 146.0 lb

## 2019-11-16 DIAGNOSIS — R7303 Prediabetes: Secondary | ICD-10-CM

## 2019-11-16 DIAGNOSIS — I2102 ST elevation (STEMI) myocardial infarction involving left anterior descending coronary artery: Secondary | ICD-10-CM

## 2019-11-16 DIAGNOSIS — I5022 Chronic systolic (congestive) heart failure: Secondary | ICD-10-CM

## 2019-11-16 DIAGNOSIS — I10 Essential (primary) hypertension: Secondary | ICD-10-CM

## 2019-11-16 MED ORDER — CARVEDILOL 12.5 MG PO TABS: 12.5000 mg | ORAL_TABLET | Freq: Two times a day (BID) | ORAL | 3 refills | Status: DC

## 2019-11-16 MED ORDER — ATORVASTATIN CALCIUM 20 MG PO TABS: 10.0000 mg | ORAL_TABLET | Freq: Every day | ORAL | 2 refills | Status: DC

## 2019-11-16 MED ORDER — SACUBITRIL-VALSARTAN 24-26 MG PO TABS: 1.0000 | ORAL_TABLET | Freq: Two times a day (BID) | ORAL | 0 refills | Status: DC

## 2019-11-16 MED ORDER — SPIRONOLACTONE 25 MG PO TABS: 12.5000 mg | ORAL_TABLET | Freq: Every day | ORAL | 0 refills | Status: DC

## 2019-11-16 MED ORDER — LOSARTAN POTASSIUM 50 MG PO TABS: 50.0000 mg | ORAL_TABLET | Freq: Every day | ORAL | 0 refills | Status: DC

## 2019-11-16 MED ORDER — ASPIRIN 81 MG PO CHEW: CHEWABLE_TABLET | ORAL | 1 refills | Status: DC

## 2019-11-16 MED ORDER — METFORMIN HCL 500 MG PO TABS: 500.0000 mg | ORAL_TABLET | Freq: Every day | ORAL | 1 refills | Status: DC

## 2019-11-16 MED ORDER — CLOPIDOGREL BISULFATE 75 MG PO TABS: 75.0000 mg | ORAL_TABLET | Freq: Every day | ORAL | 4 refills | Status: DC

## 2019-11-16 NOTE — Patient Instructions (Signed)
Plan de alimentacin DASH DASH Eating Plan DASH es la sigla en ingls de "Enfoques Alimentarios para Detener la Hipertensin" (Dietary Approaches to Stop Hypertension). El plan de alimentacin DASH ha demostrado bajar la presin arterial elevada (hipertensin). Tambin puede reducir [employer] de diabetes tipo 2, enfermedad cardaca y accidente cerebrovascular. Este plan tambin puede ayudar a Horticulturist, commercial. Consejos para seguir este plan  Pautas generales  Evite ingerir ms de 2,300 mg (miligramos) de sal (sodio) por da. Si tiene hipertensin, es posible que necesite reducir la ingesta de sodio a 1,500 mg por da.  Limite el consumo de alcohol a no ms de 41medida por da si es mujer y no est [place], y 55medidas por da si es hombre. Una medida equivale a 12oz (354ml) de cerveza, 5oz (169ml) de vino o 1oz (53ml) de bebidas alcohlicas de alta graduacin.  Trabaje con su mdico para mantener un peso saludable o perder [employer]. Pregntele cul es el peso recomendado para usted.  Realice al menos 30 minutos de ejercicio que haga que se acelere su corazn (ejercicio Arboriculturist) la [employer] de la [place]. Estas actividades pueden incluir caminar, nadar o andar en bicicleta.  Trabaje con su mdico o especialista en alimentacin y nutricin (nutricionista) para ajustar su plan alimentario a sus necesidades calricas personales. Lectura de las etiquetas de los alimentos   Verifique en las etiquetas de los alimentos, la cantidad de sodio por porcin. Elija alimentos con menos del 5 por ciento del valor diario de sodio. Generalmente, los alimentos con menos de 300 mg de sodio por porcin se encuadran dentro de este plan alimentario.  Para encontrar cereales integrales, busque la palabra "integral" como primera palabra en la lista de ingredientes. De compras  Compre productos en los que en su etiqueta diga: "bajo contenido de sodio" o "sin agregado de sal".  Compre alimentos frescos.  Evite los alimentos enlatados y comidas precocidas o congeladas. Coccin  Evite agregar sal cuando cocine. Use hierbas o aderezos sin sal, en lugar de sal de mesa o sal marina. Consulte al mdico o farmacutico antes de usar sustitutos de la sal.  No fra los alimentos. A la hora de cocinar los alimentos opte por hornearlos, hervirlos, grillarlos y asarlos a Administrator, arts.  Cocine con aceites cardiosaludables, como oliva, canola, soja o girasol. Planificacin de las comidas  Consuma una dieta equilibrada, que incluya lo siguiente: ? 5o ms porciones de frutas y Set designer. Trate de que la mitad del plato de cada comida sean frutas y verduras. ? Hasta 6 u 8 porciones de cereales integrales por da. ? Menos de 6 onzas de carne, aves o pescado Games developer. Una porcin de 3 onzas de carne tiene casi el mismo tamao que un mazo de cartas. Un huevo equivale a 1 onza. ? Dos porciones de productos lcteos descremados por Training and development officer. ? Una porcin de frutos secos, semillas o frijoles 5 veces por semana. ? Grasas cardiosaludables. Las grasas saludables llamadas cidos grasos omega-3 se encuentran en alimentos como semillas de lino y pescados de agua fra, como por ejemplo, sardinas, salmn y caballa.  Limite la cantidad que ingiere de los siguientes alimentos: ? Alimentos enlatados o envasados. ? Alimentos con alto contenido de grasa trans, como alimentos fritos. ? Alimentos con alto contenido de grasa saturada, como carne con grasa. ? Dulces, postres, bebidas azucaradas y otros alimentos con azcar agregada. ? Productos lcteos enteros.  No le agregue sal a los alimentos antes de probarlos.  Trate de comer  al menos 2 comidas vegetarianas por semana.  Consuma ms comida casera y menos de restaurante, de bufs y comida rpida.  Cuando coma en un restaurante, pida que preparen su comida con menos sal o, en lo posible, sin nada de sal. Qu alimentos se recomiendan? Los alimentos enumerados a  continuacin no constituyen Furniture conservator/restorer. Hable con el nutricionista sobre las mejores opciones alimenticias para usted. Cereales Pan de salvado o integral. Pasta de salvado o integral. Arroz integral. Avena. Quinua. Trigo burgol. Cereales integrales y con bajo contenido de sodio. Pan pita. Galletitas de [place] con bajo contenido de [place] y [place]. Tortillas de [place] integral. Verduras Verduras frescas o congeladas (crudas, al vapor, asadas o grilladas). Jugos de tomate y verduras con bajo contenido de sodio o reducidos en sodio. Salsa y pasta de tomate con bajo contenido de sodio o reducidas en sodio. Verduras enlatadas con bajo contenido de sodio o reducidas en sodio. Frutas Todas las frutas frescas, congeladas o disecadas. Frutas enlatadas en jugo natural (sin agregado de azcar). Carne y otros alimentos proteicos Pollo o pavo sin piel. Carne de pollo o de [place]. Cerdo desgrasado. Pescado y [employer]. Claras de huevo. Porotos, guisantes o lentejas secos. Frutos secos, mantequilla de frutos secos y semillas sin sal. Frijoles enlatados sin sal. Cortes de carne vacuna magra, desgrasada. Embutidos magros, con bajo contenido de [place]. Lcteos Leche descremada (1%) o descremada. Quesos sin grasa, con bajo contenido de grasa o descremados. Queso blanco o ricota sin grasa, con bajo contenido de [place]. Yogur semidescremado o descremado. Queso con bajo contenido de [place] y [place]. Grasas y [employer] untables que no contengan grasas trans. Aceite vegetal. Lubertha Basque y aderezos para ensaladas livianos o con bajo contenido de grasas (reducidos en sodio). Aceite de canola, crtamo, oliva, soja y [place]. [place]. Condimentos y otros alimentos Hierbas. Especias. Mezclas de condimentos sin sal. Palomitas de maz y pretzels sin sal. Dulces con bajo contenido de grasas. Qu alimentos no se recomiendan? Los alimentos enumerados a continuacin no constituyen Furniture conservator/restorer. Hable con el  nutricionista sobre las mejores opciones alimenticias para usted. Cereales Productos de panificacin hechos con grasa, como medialunas, magdalenas y algunos panes. Comidas con arroz o pasta seca listas para usar. Verduras Verduras con crema o fritas. Verduras en [place]. Verduras enlatadas regulares (que no sean con bajo contenido de sodio o reducidas en sodio). Pasta y salsa de tomates enlatadas regulares (que no sean con bajo contenido de sodio o reducidas en sodio). Jugos de tomate y verduras regulares (que no sean con bajo contenido de sodio o reducidos en sodio). Pepinillos. Aceitunas. Lambert Mody Fruta enlatada en almbar liviano o espeso. Frutas cocidas en aceite. Frutas con salsa de crema o [place]. Carne y otros alimentos proteicos Cortes de carne con grasa. Costillas. Carne frita. [place]. Salchichas. Mortadela y otras carnes procesadas. Salame. Panceta. Perros calientes (hotdogs). Rotonda. Frutos secos y semillas con sal. Frijoles enlatados con agregado de sal. Pescado enlatado o ahumado. Huevos enteros o yemas. Pollo o pavo con piel. Lcteos Leche entera o al 2%, crema y mitad leche y mitad crema. Queso crema entero o con toda su grasa. Yogur entero o endulzado. Quesos con toda su grasa. Sustitutos de cremas no lcteas. Coberturas batidas. Quesos para untar y quesos procesados. Grasas y [employer]. Margarina en barra. [place]. Materia grasa. Mantequilla clarificada. Grasa de panceta. Aceites tropicales como aceite de coco, palmiste o palma. Condimentos y otros alimentos Palomitas de maz y pretzels con sal. Sal  de cebolla, sal de ajo, sal condimentada, sal de mesa y sal marina. Salsa [place]. Salsa trtara. Salsa barbacoa. Salsa teriyaki. Salsa de soja, incluso la que tiene contenido reducido de [place]. Salsa de carne. Salsas en lata y envasadas. Salsa de pescado. Salsa de [place]. Salsa rosada. Rbano picante envasado. [place]. Mostaza. Saborizantes y  tiernizantes para carne. Caldo en cubitos. Salsa picante y salsa tabasco. Escabeches envasados o ya preparados. Aderezos para tacos prefabricados o envasados. Salsas. Aderezos comunes para ensalada. Dnde encontrar ms informacin:  [place], los Pulmones y Herbalist (National Heart, Lung, and [place]): https://wilson-eaton.com/  Asociacin Estadounidense del Corazn (American Heart Association): www.heart.org Resumen  El plan de alimentacin DASH ha demostrado bajar la presin arterial elevada (hipertensin). Tambin puede reducir UnitedHealth de diabetes tipo 2, enfermedad cardaca y accidente cerebrovascular.  Con el plan de alimentacin DASH, deber limitar el consumo de sal (sodio) a 2,300 mg por da. Si tiene hipertensin, es posible que necesite reducir la ingesta de sodio a 1,500 mg por da.  Cuando siga el plan de alimentacin DASH, trate de comer ms frutas frescas y verduras, cereales integrales, carnes magras, lcteos descremados y grasas cardiosaludables.  Trabaje con su mdico o especialista en alimentacin y nutricin (nutricionista) para ajustar su plan alimentario a sus necesidades calricas personales. Esta informacin no tiene Marine scientist el consejo del mdico. Asegrese de hacerle al mdico cualquier pregunta que tenga. Document Revised: 06/09/2016 Document Reviewed: 06/09/2016 Elsevier Patient Education  [place].

## 2019-11-16 NOTE — Progress Notes (Signed)
Established Patient Office Visit  Subjective:  Patient ID: Lucas [name], male    DOB: 09-15-1955  Age: 64 y.o. MRN: [id number]  CC:  Chief Complaint  Patient presents with   Prediabetes   Hypertension    HPI Lucas [name] [name] presents for  follow up of Prediabetes, Hypertension, medication refill and lab review. He states that he's compliant with his medications and continues to make healthy lifestyle changes. His HgbA1c done on 11/02/2019 increased from 6.1% to 6.2%. His Lipid panel, LDL increased from 41 mg/dl to 79 mg/dl, HDL decreased from 34 mg/dl to 30 mg/dl, Triglycerides increased from 185 mg/dl to 215 mg/dl. He was seen by Cardiology Gilford Rile C.S NP on 07/21/2019 and he's to continue on current treatment regimen and follow up with Dr End in 6 months. He will follow up at CHF clinic on 11/23/2019. Overall, he states that he's doing well and offers no further complaint.  Past Medical History:  Diagnosis Date   Acute ST elevation myocardial infarction (STEMI) involving left anterior descending (LAD) coronary artery ([place])    a. 01/2017 Ant STEMI/PCI: LAD 20ost, 100p (3.0x23 Xience Tonsina).   CAD (coronary artery disease)    a. 01/2017 Ant STEMI/PCI: LM 20d, LAD 20ost, 100p (3.0x23 Xience Sierra DES), 9m, D1 50ost, LCX nl, RCA 30p; b. 11/2017 MV: Intermediate risk w/ 23mm inf ST dep; c. 11/2017 Cath: LM nl, LAD patent prox stent, D1 20ost, LCX nl, RCA 30p. EF 45-50%.   Chronic combined systolic (congestive) and diastolic (congestive) heart failure ([place])    a. 01/2018 Echo: EF 30-35%, sev antsept, ant, apical HK. Gr1 DD, mild MR.   Essential hypertension    Hyperlipidemia    a. Myalgias with high dose lipitor - tolerating 40mg  daily.   Ischemic cardiomyopathy    a. 01/2018 Echo: EF 30-35%.   Type II diabetes mellitus ([place])     Past Surgical History:  Procedure Laterality Date   APPENDECTOMY     CARDIAC CATHETERIZATION     CORONARY ANGIOPLASTY      CORONARY/GRAFT ACUTE MI REVASCULARIZATION N/A 01/25/2017   Procedure: Coronary/Graft Acute MI Revascularization;  Surgeon: Nelva Bush, MD;  Location: [facility];  Service: Cardiovascular;  Laterality: N/A;   LEFT HEART CATH AND CORONARY ANGIOGRAPHY N/A 01/25/2017   Procedure: LEFT HEART CATH AND CORONARY ANGIOGRAPHY;  Surgeon: Nelva Bush, MD;  Location: [facility];  Service: Cardiovascular;  Laterality: N/A;   LEFT HEART CATH AND CORONARY ANGIOGRAPHY N/A 11/20/2017   Procedure: LEFT HEART CATH AND CORONARY ANGIOGRAPHY;  Surgeon: Wellington Hampshire, MD;  Location: [facility];  Service: Cardiovascular;  Laterality: N/A;    Family History  Problem Relation Age of Onset   Heart disease Father        s/p pacemaker   Arrhythmia Father    Asthma Father    Asthma Mother     Social History   Socioeconomic History   Marital status: Married    Spouse name: Not on file   Number of children: Not on file   Years of education: Not on file   Highest education level: Not on file  Occupational History   Occupation: unemployed  Tobacco Use   Smoking status: Never Smoker   Smokeless tobacco: Never Used  Scientific laboratory technician Use: Never used  Substance and Sexual Activity   Alcohol use: No   Drug use: No   Sexual activity: Yes    Birth control/protection: Coitus interruptus,  None  Other Topics Concern   Not on file  Social History Narrative   Not on file   Social Determinants of Health   Financial Resource Strain: Medium Risk   Difficulty of Paying Living Expenses: Somewhat hard  Food Insecurity: No Food Insecurity   Worried About Running Out of Food in the Last Year: Never true   Ran Out of Food in the Last Year: Never true  Transportation Needs: Unmet Transportation Needs   Lack of Transportation (Medical): Yes   Lack of Transportation (Non-Medical): Yes  Physical Activity: Insufficiently Active   Days of Exercise per  Week: 3 days   Minutes of Exercise per Session: 30 min  Stress: No Stress Concern Present   Feeling of Stress : Not at all  Social Connections: Moderately Isolated   Frequency of Communication with Friends and Family: More than three times a week   Frequency of Social Gatherings with Friends and Family: More than three times a week   Attends Religious Services: More than 4 times per year   Active Member of [employer] or Organizations: No   Attends Archivist Meetings: Never   Marital Status: Separated  Intimate Partner Violence: Not At Risk   Fear of Current or Ex-Partner: No   Emotionally Abused: No   Physically Abused: No   Sexually Abused: No    Outpatient Medications Prior to Visit  Medication Sig Dispense Refill   aspirin 81 MG chewable tablet Take one pill by mouth every day. 90 tablet 1   atorvastatin (LIPITOR) 20 MG tablet Take 0.5 tablets (10 mg total) by mouth daily. 30 tablet 2   carvedilol (COREG) 12.5 MG tablet Take 1 tablet (12.5 mg total) by mouth 2 (two) times daily. 180 tablet 3   clopidogrel (PLAVIX) 75 MG tablet Take 75 mg by mouth daily.     metFORMIN (GLUCOPHAGE) 500 MG tablet Take 1 tablet (500 mg total) by mouth daily with breakfast. 90 tablet 1   sacubitril-valsartan (ENTRESTO) 24-26 MG Take 1 tablet by mouth 2 (two) times daily. 180 tablet 0   spironolactone (ALDACTONE) 25 MG tablet Take 0.5 tablets (12.5 mg total) by mouth daily. 45 tablet 0   No facility-administered medications prior to visit.    No Known Allergies  ROS Review of Systems  Constitutional: Negative.   Eyes: Negative.   Respiratory: Negative.   Cardiovascular: Negative.   Gastrointestinal: Negative.   Skin: Negative.   Neurological: Negative.   Hematological: Negative.   Psychiatric/Behavioral: Negative.       Objective:    Physical Exam Constitutional:      Appearance: Normal appearance.  HENT:     Head: Normocephalic.  Cardiovascular:     Rate  and Rhythm: Normal rate and regular rhythm.     Pulses: Normal pulses.     Heart sounds: Normal heart sounds.  Pulmonary:     Effort: Pulmonary effort is normal.     Breath sounds: Normal breath sounds.  Neurological:     General: No focal deficit present.     Mental Status: He is alert and oriented to person, place, and time. Mental status is at baseline.  Psychiatric:        Mood and Affect: Mood normal.        Behavior: Behavior normal.        Thought Content: Thought content normal.        Judgment: Judgment normal.     BP 127/81 (BP Location: Right Arm, Patient Position: Sitting)  Pulse 65    Ht 5' (1.524 m)    Wt 146 lb (66.2 kg)    SpO2 97%    BMI 28.51 kg/m  Wt Readings from Last 3 Encounters:  11/16/19 146 lb (66.2 kg)  07/21/19 149 lb (67.6 kg)  06/30/19 148 lb 4 oz (67.2 kg)     Health Maintenance Due  Topic Date Due   Hepatitis C Screening  Never done   TETANUS/TDAP  Never done   INFLUENZA VACCINE  10/02/2019    There are no preventive care reminders to display for this patient.  Lab Results  Component Value Date   TSH 3.160 08/19/2018   Lab Results  Component Value Date   WBC 7.2 08/19/2018   HGB 15.5 08/19/2018   HCT 45.7 08/19/2018   MCV 95 08/19/2018   PLT 209 08/19/2018   Lab Results  Component Value Date   NA 139 05/11/2019   K 4.2 05/11/2019   CO2 26 05/11/2019   GLUCOSE 162 (H) 05/11/2019   BUN 13 05/11/2019   CREATININE 0.83 05/11/2019   BILITOT 0.5 05/11/2019   ALKPHOS 51 05/11/2019   AST 17 05/11/2019   ALT 14 05/11/2019   PROT 7.1 05/11/2019   ALBUMIN 4.5 05/11/2019   CALCIUM 9.8 05/11/2019   ANIONGAP 9 11/20/2017   Lab Results  Component Value Date   CHOL 145 11/02/2019   Lab Results  Component Value Date   HDL 30 (L) 11/02/2019   Lab Results  Component Value Date   LDLCALC 79 11/02/2019   Lab Results  Component Value Date   TRIG 215 (H) 11/02/2019   Lab Results  Component Value Date   CHOLHDL 4.8  11/02/2019   Lab Results  Component Value Date   HGBA1C 6.2 (H) 11/02/2019      Assessment & Plan:   1. Chronic systolic heart failure ([place]) - He will continue on current treatment regimen and follow up with CHF clinic Ms. Rolena Infante FNP on 11/23/2019. - sacubitril-valsartan (ENTRESTO) 24-26 MG; Take 1 tablet by mouth 2 (two) times daily.  Dispense: 180 tablet; Refill: 0 - spironolactone (ALDACTONE) 25 MG tablet; Take 0.5 tablets (12.5 mg total) by mouth daily.  Dispense: 45 tablet; Refill: 0  2. Essential hypertension - His blood pressure is under control, and he will continue on current treatment regimen and DASH diet.  3. STEMI involving left anterior descending coronary artery ([place]) - He will continue on current treatment regimen and low fat/low cholesterol diet. - atorvastatin (LIPITOR) 20 MG tablet; Take 0.5 tablets (10 mg total) by mouth daily.  Dispense: 30 tablet; Refill: 2 - spironolactone (ALDACTONE) 25 MG tablet; Take 0.5 tablets (12.5 mg total) by mouth daily.  Dispense: 45 tablet; Refill: 0  4. Prediabetes - His HgbA1c was 6.2%, he will continue on current treatment regimen, low carb/non concentrated sweet diet. - metFORMIN (GLUCOPHAGE) 500 MG tablet; Take 1 tablet (500 mg total) by mouth daily with breakfast.  Dispense: 90 tablet; Refill: 1     Follow-up: Return in about 12 weeks (around 02/08/2020), or if symptoms worsen or fail to improve.    Sandrea Boer Jerold Coombe, NP

## 2019-11-23 ENCOUNTER — Ambulatory Visit: Payer: Self-pay | Attending: Family | Admitting: Family

## 2019-11-23 ENCOUNTER — Encounter: Payer: Self-pay | Admitting: Family

## 2019-11-23 VITALS — BP 123/80 | HR 67 | Resp 18 | Ht 65.0 in | Wt 147.0 lb

## 2019-11-23 DIAGNOSIS — I252 Old myocardial infarction: Secondary | ICD-10-CM | POA: Insufficient documentation

## 2019-11-23 DIAGNOSIS — I10 Essential (primary) hypertension: Secondary | ICD-10-CM

## 2019-11-23 DIAGNOSIS — Z7984 Long term (current) use of oral hypoglycemic drugs: Secondary | ICD-10-CM | POA: Insufficient documentation

## 2019-11-23 DIAGNOSIS — Z955 Presence of coronary angioplasty implant and graft: Secondary | ICD-10-CM | POA: Insufficient documentation

## 2019-11-23 DIAGNOSIS — R7303 Prediabetes: Secondary | ICD-10-CM | POA: Insufficient documentation

## 2019-11-23 DIAGNOSIS — I255 Ischemic cardiomyopathy: Secondary | ICD-10-CM | POA: Insufficient documentation

## 2019-11-23 DIAGNOSIS — E785 Hyperlipidemia, unspecified: Secondary | ICD-10-CM | POA: Insufficient documentation

## 2019-11-23 DIAGNOSIS — I110 Hypertensive heart disease with heart failure: Secondary | ICD-10-CM | POA: Insufficient documentation

## 2019-11-23 DIAGNOSIS — I2510 Atherosclerotic heart disease of native coronary artery without angina pectoris: Secondary | ICD-10-CM | POA: Insufficient documentation

## 2019-11-23 DIAGNOSIS — Z713 Dietary counseling and surveillance: Secondary | ICD-10-CM | POA: Insufficient documentation

## 2019-11-23 DIAGNOSIS — I5032 Chronic diastolic (congestive) heart failure: Secondary | ICD-10-CM | POA: Insufficient documentation

## 2019-11-23 DIAGNOSIS — Z8249 Family history of ischemic heart disease and other diseases of the circulatory system: Secondary | ICD-10-CM | POA: Insufficient documentation

## 2019-11-23 DIAGNOSIS — Z7982 Long term (current) use of aspirin: Secondary | ICD-10-CM | POA: Insufficient documentation

## 2019-11-23 DIAGNOSIS — Z79899 Other long term (current) drug therapy: Secondary | ICD-10-CM | POA: Insufficient documentation

## 2019-11-23 DIAGNOSIS — I5022 Chronic systolic (congestive) heart failure: Secondary | ICD-10-CM

## 2019-11-23 DIAGNOSIS — Z7902 Long term (current) use of antithrombotics/antiplatelets: Secondary | ICD-10-CM | POA: Insufficient documentation

## 2019-11-23 LAB — GLUCOSE, CAPILLARY: Glucose-Capillary: 212 mg/dL — ABNORMAL HIGH (ref 70–99)

## 2019-11-23 NOTE — Progress Notes (Signed)
Patient ID: Lucas [name], male    DOB: [birth year], 64 y.o.   MRN: [id number]  HPI  Entire visit done with [facility] interpreter present  Lucas [name] is a 64 y/o male with a history of STEMI, HTN and heart failure.  Echo report on 05/29/17 reviewed and showed an EF of 40-45% along with mild Lucas. Echo report done 01/26/17 reviewed and shows an EF of 30-35% along with mild Lucas.   Cardiac catheterization done 01/25/17 showed mild to moderate non-obstructive disease involving the mid/distal LAD and RCA. Moderately elevated left ventricular filling pressure. 100% thrombotic occlusion of the proximal LAD. DES was successfully placed.   He hasn't been admitted or been in the ED in the last 12 months.  He presents today with a chief complaint of a follow-up visit. He says that he has been very active and walking around the park x30 minutes daily. Patient reports minimal episodes of SOB at the end of his walks. Denies chest pain. Patient reports he feels good and that he continues to monitor his condition carefully and continue to improve.   Past Medical History:  Diagnosis Date  . Acute ST elevation myocardial infarction (STEMI) involving left anterior descending (LAD) coronary artery ([place])    a. 01/2017 Ant STEMI/PCI: LAD 20ost, 100p (3.0x23 Xience Sierra DES).  . CAD (coronary artery disease)    a. 01/2017 Ant STEMI/PCI: LM 20d, LAD 20ost, 100p (3.0x23 Xience Sierra DES), 11m, D1 50ost, LCX nl, RCA 30p; b. 11/2017 MV: Intermediate risk w/ 10mm inf ST dep; c. 11/2017 Cath: LM nl, LAD patent prox stent, D1 20ost, LCX nl, RCA 30p. EF 45-50%.  . Chronic combined systolic (congestive) and diastolic (congestive) heart failure ([place])    a. 01/2018 Echo: EF 30-35%, sev antsept, ant, apical HK. Gr1 DD, mild Lucas.  . Essential hypertension   . Hyperlipidemia    a. Myalgias with high dose lipitor - tolerating 40mg  daily.  . Ischemic cardiomyopathy    a. 01/2018 Echo: EF 30-35%.  . Type II diabetes  mellitus ([place])    Past Surgical History:  Procedure Laterality Date  . APPENDECTOMY    . CARDIAC CATHETERIZATION    . CORONARY ANGIOPLASTY    . CORONARY/GRAFT ACUTE MI REVASCULARIZATION N/A 01/25/2017   Procedure: Coronary/Graft Acute MI Revascularization;  Surgeon: Nelva Bush, MD;  Location: [place] [facility];  Service: Cardiovascular;  Laterality: N/A;  . LEFT HEART CATH AND CORONARY ANGIOGRAPHY N/A 01/25/2017   Procedure: LEFT HEART CATH AND CORONARY ANGIOGRAPHY;  Surgeon: Nelva Bush, MD;  Location: [place] [facility];  Service: Cardiovascular;  Laterality: N/A;  . LEFT HEART CATH AND CORONARY ANGIOGRAPHY N/A 11/20/2017   Procedure: LEFT HEART CATH AND CORONARY ANGIOGRAPHY;  Surgeon: Wellington Hampshire, MD;  Location: [place] [facility];  Service: Cardiovascular;  Laterality: N/A;   Family History  Problem Relation Age of Onset  . Heart disease Father        s/p pacemaker  . Arrhythmia Father   . Asthma Father   . Asthma Mother    Social History   Tobacco Use  . Smoking status: Never Smoker  . Smokeless tobacco: Never Used  Substance Use Topics  . Alcohol use: No   No Known Allergies   Prior to Admission medications   Medication Sig Start Date End Date Taking? Authorizing Provider  aspirin 81 MG chewable tablet Take one pill by mouth every day. 11/16/19  Yes Iloabachie, Chioma E, NP  atorvastatin (LIPITOR) 20 MG tablet  Take 0.5 tablets (10 mg total) by mouth daily. 11/16/19  Yes Iloabachie, Chioma E, NP  carvedilol (COREG) 12.5 MG tablet Take 1 tablet (12.5 mg total) by mouth 2 (two) times daily. 11/16/19 02/14/20 Yes Iloabachie, Chioma E, NP  clopidogrel (PLAVIX) 75 MG tablet Take 1 tablet (75 mg total) by mouth daily. 11/16/19  Yes Iloabachie, Chioma E, NP  metFORMIN (GLUCOPHAGE) 500 MG tablet Take 1 tablet (500 mg total) by mouth daily with breakfast. 11/16/19  Yes Iloabachie, Chioma E, NP  sacubitril-valsartan (ENTRESTO) 24-26 MG Take 1 tablet by mouth 2  (two) times daily. 11/16/19  Yes Iloabachie, Chioma E, NP  spironolactone (ALDACTONE) 25 MG tablet Take 0.5 tablets (12.5 mg total) by mouth daily. 11/16/19  Yes Iloabachie, Chioma E, NP     Review of Systems  Constitutional: Negative for activity change (Patient reports he has been walking 30 minutes a day outside the park x2 years), appetite change and fatigue.  HENT: Negative for congestion, postnasal drip and sore throat.   Eyes: Negative.   Respiratory: Positive for cough ("very little") and shortness of breath (Patient reports becoming SOB when he walks long distances). Negative for chest tightness and wheezing.   Cardiovascular: Negative for chest pain, palpitations and leg swelling.  Gastrointestinal: Negative for abdominal distention and abdominal pain.  Endocrine: Negative.   Genitourinary: Negative.   Musculoskeletal: Negative for [name] pain, gait problem and neck pain.  Skin: Negative.   Allergic/Immunologic: Negative.   Neurological: Positive for headaches (at times). Negative for dizziness, weakness and light-headedness.  Hematological: Negative for adenopathy. Does not bruise/bleed easily.  Psychiatric/Behavioral: Negative for dysphoric mood and sleep disturbance (sleeping on 2 pillows). The patient is not nervous/anxious.     Physical Exam Vitals and nursing note reviewed.  Constitutional:      Appearance: He is well-developed.  HENT:     Head: Normocephalic and atraumatic.  Neck:     Vascular: No JVD.  Cardiovascular:     Rate and Rhythm: Normal rate and regular rhythm.  Pulmonary:     Effort: Pulmonary effort is normal. No respiratory distress.     Breath sounds: No wheezing or rales.  Abdominal:     General: There is no distension.     Palpations: Abdomen is soft.     Tenderness: There is no abdominal tenderness.  Musculoskeletal:        General: No tenderness.     Cervical [name]: Normal range of motion and neck supple.  Skin:    General: Skin is warm and  dry.  Neurological:     Mental Status: He is alert and oriented to person, place, and time.  Psychiatric:        Behavior: Behavior normal.        Thought Content: Thought content normal.     Vitals with BMI 11/23/2019 11/16/2019 07/21/2019  Height 5\' 5"  5\' 0"  5\' 0"   Weight 147 lbs 146 lbs 149 lbs  BMI 24.46 79.89 21.1  Systolic 941 740 814  Diastolic 80 81 76  Pulse 67 65 64   Lab Results  Component Value Date   CREATININE 0.83 05/11/2019   CREATININE 0.87 08/19/2018   CREATININE 0.84 02/16/2018     Assessment & Plan:  1: Chronic heart failure with reduced ejection fraction- - NYHA class I - euvolemic today - weighing daily. Reminded to call for an overnight weight gain of >2 pounds or a weekly weight gain of >5 pounds - weight up 1 lb since last visit -  not adding salt; reminded to closely follow a 2000mg  sodium diet. Can use Mrs Deliah Boston for seasoning   2: HTN-  - BP looks good today - [facility]16[facility] - BMP from 05/11/19 reviewed and showed sodium 139, potassium 4.2, creatinine 0.83 and GFR 95   3. Pre-diabeties -Discussed importance of sugar and carb moderation as well as what A1C means for him as a patient. Most recent A1C 6.2. -Gave spanish education in AVS for diet choices and diabetes -Patient reports understanding and will attempt to reduce sugar and carb intake - nonfasting glucose in clinic today was 212   Medication bottles were reviewed.   Return in 6 months or sooner for any questions/problems before then.

## 2019-11-23 NOTE — Patient Instructions (Addendum)
Try to reduce sodium in diet and reduce carbs and sugars as your blood sugar was 212 today. [facility]    Diabetes mellitus y nutricin, en adultos Diabetes Mellitus and Nutrition, Adult Si sufre de diabetes (diabetes mellitus), es muy importante tener hbitos alimenticios saludables debido a que sus niveles de Designer, television/film set sangre (glucosa) se ven afectados en gran medida por lo que come y bebe. [place] alimentos saludables en las cantidades [place], aproximadamente a la [employer], [place] ayudar a:  Aeronautical engineer glucemia.  Disminuir el riesgo de sufrir una enfermedad cardaca.  Mejorar la presin arterial.  Science writer o mantener un peso saludable. Todas las personas que sufren de diabetes son diferentes y cada una tiene necesidades diferentes en cuanto a un plan de alimentacin. El mdico puede recomendarle que trabaje con un especialista en dietas y nutricin (nutricionista) para Financial trader plan para usted. Su plan de alimentacin puede variar segn factores como:  Las caloras que necesita.  Los medicamentos que toma.  Su peso.  Sus niveles de glucemia, presin arterial y colesterol.  Su nivel de [place].  Otras afecciones que tenga, como enfermedades cardacas o renales. Cmo me afectan los carbohidratos? Los carbohidratos, o hidratos de carbono, afectan su nivel de glucemia ms que cualquier otro tipo de alimento. La ingesta de carbohidratos naturalmente aumenta la cantidad de [employer]. El recuento de carbohidratos es un mtodo destinado a Catering manager un registro de la cantidad de carbohidratos que se consumen. El recuento de carbohidratos es importante para Theatre manager la glucemia a un nivel saludable, especialmente si utiliza insulina o toma determinados medicamentos por va oral para la diabetes. Es importante conocer la cantidad de carbohidratos que se pueden ingerir en cada comida sin correr Engineer, manufacturing. Esto es Product/process development scientist. Su nutricionista puede ayudarlo a calcular la cantidad de carbohidratos que debe ingerir en cada comida y en cada refrigerio. Entre los alimentos que contienen carbohidratos, se incluyen:  Pan, cereal, arroz, pastas y galletas.  Papas y maz.  Guisantes, frijoles y lentejas.  Leche y Estate agent.  Lambert Mody y [place].  Postres, como pasteles, galletas, helado y caramelos. Cmo me afecta el alcohol? El alcohol puede provocar disminuciones sbitas de la glucemia (hipoglucemia), especialmente si utiliza insulina o toma determinados medicamentos por va oral para la diabetes. La hipoglucemia es una afeccin potencialmente mortal. Los sntomas de la hipoglucemia (somnolencia, mareos y confusin) son similares a los sntomas de haber consumido demasiado alcohol. Si el mdico afirma que el alcohol es seguro para usted, [place] estas pautas:  Limite el consumo de alcohol a no ms de 41medida por da si es mujer y no est [place], y a 31medidas si es hombre. Una medida equivale a 12oz (388ml) de cerveza, 5oz (18ml) de vino o 1oz (41ml) de bebidas alcohlicas de alta graduacin.  No beba con el estmago vaco.  Mantngase hidratado bebiendo agua, refrescos dietticos o t helado sin azcar.  Tenga en cuenta que los refrescos comunes, los jugos y otras bebida para Optician, dispensing pueden contener mucha azcar y se deben contar como carbohidratos. Cules son algunos consejos para seguir este plan?  Leer las etiquetas de los alimentos  Comience por leer el tamao de la porcin en la "Informacin nutricional" en las etiquetas de los alimentos envasados y las bebidas. La cantidad de caloras, carbohidratos, grasas y otros nutrientes mencionados en la etiqueta se basan en una porcin del alimento. Muchos alimentos contienen ms de una porcin  por envase.  Verifique la cantidad total de gramos (g) de carbohidratos totales en una porcin. Puede calcular la cantidad de porciones de carbohidratos al dividir  el total de carbohidratos por 15. Por ejemplo, si un alimento tiene un total de 30g de carbohidratos, equivale a 2 porciones de carbohidratos.  Verifique la cantidad de gramos (g) de grasas saturadas y grasas trans en una porcin. Escoja alimentos que no contengan grasa o que tengan un bajo contenido.  Verifique la cantidad de miligramos (mg) de sal (sodio) en una porcin. La [employer] de las personas deben limitar la ingesta de sodio total a menos de 2300mg  por Training and development officer.  Siempre consulte la informacin nutricional de los alimentos etiquetados como "con bajo contenido de grasa" o "sin grasa". Estos alimentos pueden tener un mayor contenido de Location manager agregada o carbohidratos refinados, y deben evitarse.  Hable con su nutricionista para identificar sus objetivos diarios en cuanto a los nutrientes mencionados en la etiqueta. Al ir de compras  Evite comprar alimentos procesados, enlatados o precocinados. Estos alimentos tienden a Special educational needs teacher mayor cantidad de [place], sodio y azcar agregada.  Compre en la zona exterior de la tienda de comestibles. Esta zona incluye frutas y verduras frescas, granos a granel, carnes frescas y productos lcteos frescos. Al cocinar  Utilice mtodos de coccin a baja temperatura, como hornear, en lugar de mtodos de coccin a alta temperatura, como frer en abundante aceite.  Cocine con aceites saludables, como el aceite de [place], canola o [place].  Evite cocinar con manteca, crema o carnes con alto contenido de grasa. Planificacin de las comidas  Coma las comidas y los refrigerios regularmente, preferentemente a la misma hora todos [place]. Evite pasar largos perodos de tiempo sin comer.  Consuma alimentos ricos en fibra, como frutas frescas, verduras, frijoles y cereales integrales. Consulte a su nutricionista sobre cuntas porciones de carbohidratos puede consumir en cada comida.  Consuma entre 4 y 6 onzas (oz) de protenas magras por da, como carnes [place],  pollo, pescado, huevos o tofu. Una onza de protena magra equivale a: ? 1 onza de carne, pollo o pescado. ? 1huevo. ?  taza de tofu.  Coma algunos alimentos por da que contengan grasas saludables, como aguacates, frutos secos, semillas y pescado. Estilo de vida  Controle su nivel de glucemia con regularidad.  Haga actividad fsica habitualmente como se lo haya indicado el mdico. Esto puede incluir lo siguiente: ? 152minutos semanales de ejercicio de intensidad moderada o alta. Esto podra incluir caminatas dinmicas, ciclismo o gimnasia acutica. ? Realizar ejercicios de elongacin y de fortalecimiento, como yoga o levantamiento de pesas, por lo menos 2veces por semana.  Tome los [employer] se lo haya indicado el mdico.  No consuma ningn producto que contenga nicotina o tabaco, como cigarrillos y Psychologist, sport and exercise. Si necesita ayuda para dejar de fumar, consulte al [employer] con un asesor o instructor en diabetes para identificar estrategias para controlar el estrs y cualquier desafo emocional y social. Preguntas para hacerle al mdico  Es necesario que consulte a Radio broadcast assistant en el cuidado de la diabetes?  Es necesario que me rena con un nutricionista?  A qu nmero puedo llamar si tengo preguntas?  Cules son los mejores momentos para controlar la glucemia? Dnde encontrar ms informacin:  Asociacin Estadounidense de la Diabetes (American Diabetes Association): diabetes.org  Academia de Nutricin y Information systems manager (Academy of Nutrition and Dietetics): www.eatright.[place] Diabetes y [place] y [facility] of Diabetes  and Digestive and Kidney Diseases, NIH): DesMoinesFuneral.dk Resumen  Un plan de alimentacin saludable lo ayudar a controlar la glucemia y Theatre manager un estilo de vida saludable.  Trabajar con un especialista en dietas y nutricin (nutricionista) puede ayudarlo a Scientist, clinical (histocompatibility and immunogenetics) de alimentacin para usted.  Tenga en cuenta que los carbohidratos (hidratos de carbono) y el alcohol tienen efectos inmediatos en sus niveles de glucemia. Es importante contar los carbohidratos que ingiere y consumir alcohol con prudencia. Esta informacin no tiene Marine scientist el consejo del mdico. Asegrese de hacerle al mdico cualquier pregunta que tenga. Document Revised: 10/28/2016 Document Reviewed: 06/09/2016 Elsevier Patient Education  [place].

## 2019-12-06 ENCOUNTER — Telehealth: Payer: Self-pay | Admitting: Pharmacist

## 2019-12-06 NOTE — Telephone Encounter (Signed)
12/06/2019 2:02:09 PM - Delene Loll pending  -- Elmer Picker - Tuesday, December 06, 2019 2:00 PM --I have the signed provider portion of [employer] application for Entresto--I put the patient's portion in bag with meds on the wall 11/18/2019-apparently the form was given to the Son that picked up for patient on 12/02/19--holding till patient returns form.

## 2019-12-08 ENCOUNTER — Telehealth: Payer: Self-pay | Admitting: Pharmacist

## 2019-12-08 NOTE — Telephone Encounter (Signed)
12/08/2019 9:56:14 AM - Delene Loll faxed to [employer]  -- Elmer Picker - Thursday, December 08, 2019 9:55 AM --Virgel Gess [employer] application for [employer] 24/26 Take 1 tablet by mouth two times daily-with copy of son's notarized letter of support.

## 2020-01-25 ENCOUNTER — Ambulatory Visit: Payer: Self-pay | Admitting: Family

## 2020-01-25 NOTE — Progress Notes (Deleted)
Office Visit    Patient Name: Lucas [name] Date of Encounter: 01/25/2020  Primary Care Provider:  [name], MD Primary Cardiologist:  Nelva Bush, MD Electrophysiologist:  None   Chief Complaint    Lucas [name] is a 64 y.o. male with a hx of CAD s/p late presenting anterior STEMI (11/19) s/p PCI to proximal LAD, ischemic cardiomyopathy, chronic systolic heart failure, borderline diabetes mellitus presents today for follow-up of CAD and heart failure  Past Medical History    Past Medical History:  Diagnosis Date  . Acute ST elevation myocardial infarction (STEMI) involving left anterior descending (LAD) coronary artery ([place])    a. 01/2017 Ant STEMI/PCI: LAD 20ost, 100p (3.0x23 Xience Sierra DES).  . CAD (coronary artery disease)    a. 01/2017 Ant STEMI/PCI: LM 20d, LAD 20ost, 100p (3.0x23 Xience Sierra DES), 74m, D1 50ost, LCX nl, RCA 30p; b. 11/2017 MV: Intermediate risk w/ 34mm inf ST dep; c. 11/2017 Cath: LM nl, LAD patent prox stent, D1 20ost, LCX nl, RCA 30p. EF 45-50%.  . Chronic combined systolic (congestive) and diastolic (congestive) heart failure ([place])    a. 01/2018 Echo: EF 30-35%, sev antsept, ant, apical HK. Gr1 DD, mild MR.  . Essential hypertension   . Hyperlipidemia    a. Myalgias with high dose lipitor - tolerating 40mg  daily.  . Ischemic cardiomyopathy    a. 01/2018 Echo: EF 30-35%.  . Type II diabetes mellitus ([place])    Past Surgical History:  Procedure Laterality Date  . APPENDECTOMY    . CARDIAC CATHETERIZATION    . CORONARY ANGIOPLASTY    . CORONARY/GRAFT ACUTE MI REVASCULARIZATION N/A 01/25/2017   Procedure: Coronary/Graft Acute MI Revascularization;  Surgeon: Nelva Bush, MD;  Location: [place] [facility];  Service: Cardiovascular;  Laterality: N/A;  . LEFT HEART CATH AND CORONARY ANGIOGRAPHY N/A 01/25/2017   Procedure: LEFT HEART CATH AND CORONARY ANGIOGRAPHY;  Surgeon: Nelva Bush, MD;  Location: [place] [facility];  Service: Cardiovascular;  Laterality: N/A;  . LEFT HEART CATH AND CORONARY ANGIOGRAPHY N/A 11/20/2017   Procedure: LEFT HEART CATH AND CORONARY ANGIOGRAPHY;  Surgeon: Wellington Hampshire, MD;  Location: [place] [facility];  Service: Cardiovascular;  Laterality: N/A;    Allergies  No Known Allergies  History of Present Illness    Lucas [name] is a 64 y.o. male with a hx of CAD s/p late presenting anterior STEMI (11/19) s/p PCI to proximal LAD, ischemic cardiomyopathy, chronic systolic heart failure, borderline diabetes mellitus.  He was last seen by Darylene Price, NP in the [facility] heart failure clinic 11/23/2019.    CAD is s/p late presenting anterior STEMI in November 2018 with PCI to proximal LAD.  Repeat left heart cath for chest pain September 2019 with widely patent LAD stent with mild disease involving D1.  Most recent echo 05/2017 LVEF 40-45%, wall motion abnormalities, grade 1 diastolic dysfunction, mild MR.  Visit today completed with the assistance of an interpreter.  ***  EKGs/Labs/Other Studies Reviewed:   The following studies were reviewed today:  LHC (11/20/17): LMCA normal.  LAD with widely patent proximal stent.  20% ostial D1 stenosis.  30% proximal RCA stenosis.  LCx normal.  LVEF 45% with distal anterior hypokinesis.  LVEDP 16 to 20 mmHg. LHC/PCI (01/25/17): LMCA with 20% distal stenosis.  LAD with 20% ostial disease followed by thrombotic occlusion of the proximal vessel.  25% mid LAD disease and 50% ostial D1 stenosis present.  LCx normal.  RCA with 30% proximal stenosis.  Successful PCI to the proximal LAD with placement of a Xience Sierra 3.0 x 23 mm drug-eluting stent postdilated with a 3.5 mm [place] balloon at high pressure   Exercise tolerance test (11/06/17): Abnormal, intermediate risk study with mild ST depression during stress and neck/jaw pain, concerning for anginal equivalent.  [facility] treadmill score equals 0).  TTE (05/29/2017): Normal LV  size.  LVEF 40-45% with anterior, anteroseptal, and apical hypokinesis and grade 1 diastolic dysfunction.  Mild MR.  Normal RV size and function.  Normal pulmonary artery pressure.  TTE (01/26/17): Normal LV size with moderate LVH.  LVEF 30-35% with severe anterior, anteroseptal, and apical hypokinesis.  Grade 1 diastolic dysfunction.  Mild MR.  Normal RV size and function.  EKG:  EKG is ordered today.  The ekg ordered today demonstrates normal sinus rhythm 64 bpm with stable T wave inversion in lead V1 V2.  Recent Labs: 05/11/2019: ALT 14; BUN 13; Creatinine, Ser 0.83; Potassium 4.2; Sodium 139  Recent Lipid Panel    Component Value Date/Time   CHOL 145 11/02/2019 1007   TRIG 215 (H) 11/02/2019 1007   HDL 30 (L) 11/02/2019 1007   CHOLHDL 4.8 11/02/2019 1007   CHOLHDL 2.8 05/11/2017 1526   VLDL 23 05/11/2017 1526   LDLCALC 79 11/02/2019 1007   LDLDIRECT 33 02/16/2018 0807    Home Medications   No outpatient medications have been marked as taking for the 01/25/20 encounter (Appointment) with Loel Dubonnet, NP.   Review of Systems     *** Review of Systems  Constitutional: Negative for chills, fever and malaise/fatigue.  Cardiovascular: Negative for chest pain, dyspnea on exertion, leg swelling, near-syncope, orthopnea, palpitations and syncope.  Respiratory: Negative for cough, shortness of breath and wheezing.   Gastrointestinal: Negative for nausea and vomiting.  Neurological: Negative for dizziness, light-headedness and weakness.   All other systems reviewed and are otherwise negative except as noted above.  Physical Exam   *** VS:  There were no vitals taken for this visit. , BMI There is no height or weight on file to calculate BMI. GEN: Well nourished, well developed, in no acute distress. HEENT: normal. Neck: Supple, no JVD, carotid bruits, or masses. Cardiac: RRR, no murmurs, rubs, or gallops. No clubbing, cyanosis, edema.  Radials/PT 2+ and equal bilaterally.   Respiratory:  Respirations regular and unlabored, clear to auscultation bilaterally. GI: Soft, nontender, nondistended MS: No deformity or atrophy. Skin: Warm and dry, no rash. Neuro:  Strength and sensation are intact. Psych: Normal affect.  Assessment & Plan   *** 1. CAD -stable with no anginal symptoms.  No evidence of ischemic evaluation this time.  GDMT includes aspirin, beta-blocker, statin.  He is greater than 1 year from his stent and will discontinue Plavix after finishing his current supply.  Denies bleeding complications.  EKG today shows normal sinus rhythm stable T wave inversion in lead V1 V2.  2. HLD, LDL goal less than 70 -05/2019 LDL 41.  11/02/2019 LDL 79. ***  3. Prediabetes - most recent A1c 6.2.   4. Chronic systolic heart failure due to ischemic cardiomyopathy - euvolemic and well compensated on exam.  Follows closely with heart failure clinic.  GDMT includes beta-blocker, Entresto, MRA.  No indication for loop diuretic at this time.  Low normal blood pressure precludes further escalation of heart failure therapies.  Disposition: Follow up*** in 6 month(s) with Dr. Saunders Revel or APP   Loel Dubonnet, NP 01/25/2020, 8:02 AM

## 2020-01-30 ENCOUNTER — Encounter: Payer: Self-pay | Admitting: Family

## 2020-04-12 ENCOUNTER — Telehealth: Payer: Self-pay | Admitting: Pharmacist

## 2020-04-12 NOTE — Telephone Encounter (Signed)
04/12/2020 12:14:33 PM - Delene Loll refill called to [employer]  -- Elmer Picker - Thursday, April 12, 2020 12:13 PM --I called [employer] for refill on Entresto 24/26 to ship to our office, allow 2-3 business days to receive.

## 2020-05-24 ENCOUNTER — Encounter: Payer: Self-pay | Admitting: Family

## 2020-05-24 ENCOUNTER — Other Ambulatory Visit: Payer: Self-pay

## 2020-05-24 ENCOUNTER — Ambulatory Visit: Payer: Self-pay | Attending: Family | Admitting: Family

## 2020-05-24 VITALS — BP 137/86 | HR 69 | Resp 18 | Ht 60.0 in | Wt 140.0 lb

## 2020-05-24 DIAGNOSIS — I5022 Chronic systolic (congestive) heart failure: Secondary | ICD-10-CM | POA: Insufficient documentation

## 2020-05-24 DIAGNOSIS — Z79899 Other long term (current) drug therapy: Secondary | ICD-10-CM | POA: Insufficient documentation

## 2020-05-24 DIAGNOSIS — I252 Old myocardial infarction: Secondary | ICD-10-CM | POA: Insufficient documentation

## 2020-05-24 DIAGNOSIS — I110 Hypertensive heart disease with heart failure: Secondary | ICD-10-CM | POA: Insufficient documentation

## 2020-05-24 DIAGNOSIS — Z8249 Family history of ischemic heart disease and other diseases of the circulatory system: Secondary | ICD-10-CM | POA: Insufficient documentation

## 2020-05-24 DIAGNOSIS — Z91128 Patient's intentional underdosing of medication regimen for other reason: Secondary | ICD-10-CM | POA: Insufficient documentation

## 2020-05-24 DIAGNOSIS — R7303 Prediabetes: Secondary | ICD-10-CM | POA: Insufficient documentation

## 2020-05-24 DIAGNOSIS — Z713 Dietary counseling and surveillance: Secondary | ICD-10-CM | POA: Insufficient documentation

## 2020-05-24 DIAGNOSIS — I10 Essential (primary) hypertension: Secondary | ICD-10-CM

## 2020-05-24 DIAGNOSIS — Z955 Presence of coronary angioplasty implant and graft: Secondary | ICD-10-CM | POA: Insufficient documentation

## 2020-05-24 NOTE — Patient Instructions (Addendum)
Continue weighing daily and call for an overnight weight gain of > 2 pounds or a weekly weight gain of >5   [facility] ([facility])  Dr. Mable Fill April 6th at [facility]

## 2020-05-24 NOTE — Progress Notes (Signed)
Patient ID: Lucas [name], male    DOB: [birth year], 65 y.o.   MRN: [id number]  HPI  Entire visit done with [facility] interpreter present  [name] is a 65 y/o male with a history of STEMI, HTN and heart failure.  Echo report on 05/29/17 reviewed and showed an EF of 40-45% along with mild MR. Echo report done 01/26/17 reviewed and shows an EF of 30-35% along with mild MR.   Cardiac catheterization done 01/25/17 showed mild to moderate non-obstructive disease involving the mid/distal LAD and RCA. Moderately elevated left ventricular filling pressure. 100% thrombotic occlusion of the proximal LAD. DES was successfully placed.   He hasn't been admitted or been in the ED in the last 12 months.  He presents today with a chief complaint of a follow-up visit. He currently denies any symptoms and specifically denies any difficulty sleeping, dizziness, abdominal distention, palpitations, pedal edema, chest pain, shortness of breath, cough, fatigue or weight gain.   Is no longer taking any of his medications except entresto but he only takes entresto "when it's needed" and he has felt great so he stopped everything else. When I asked when he knew when he needed entresto, he said if he had muscle pain or he was tired he would take it. Explained the medications and what they were intended for and he's agreeable to resuming entresto BID consistently but that's all.   Past Medical History:  Diagnosis Date  . Acute ST elevation myocardial infarction (STEMI) involving left anterior descending (LAD) coronary artery ([place])    a. 01/2017 Ant STEMI/PCI: LAD 20ost, 100p (3.0x23 Xience Sierra DES).  . CAD (coronary artery disease)    a. 01/2017 Ant STEMI/PCI: LM 20d, LAD 20ost, 100p (3.0x23 Xience Sierra DES), 36m, D1 50ost, LCX nl, RCA 30p; b. 11/2017 MV: Intermediate risk w/ 33mm inf ST dep; c. 11/2017 Cath: LM nl, LAD patent prox stent, D1 20ost, LCX nl, RCA 30p. EF 45-50%.  . Chronic combined systolic  (congestive) and diastolic (congestive) heart failure ([place])    a. 01/2018 Echo: EF 30-35%, sev antsept, ant, apical HK. Gr1 DD, mild MR.  . Essential hypertension   . Hyperlipidemia    a. Myalgias with high dose lipitor - tolerating 40mg  daily.  . Ischemic cardiomyopathy    a. 01/2018 Echo: EF 30-35%.  . Type II diabetes mellitus ([place])    Past Surgical History:  Procedure Laterality Date  . APPENDECTOMY    . CARDIAC CATHETERIZATION    . CORONARY ANGIOPLASTY    . CORONARY/GRAFT ACUTE MI REVASCULARIZATION N/A 01/25/2017   Procedure: Coronary/Graft Acute MI Revascularization;  Surgeon: Nelva Bush, MD;  Location: [place] [facility];  Service: Cardiovascular;  Laterality: N/A;  . LEFT HEART CATH AND CORONARY ANGIOGRAPHY N/A 01/25/2017   Procedure: LEFT HEART CATH AND CORONARY ANGIOGRAPHY;  Surgeon: Nelva Bush, MD;  Location: [place] [facility];  Service: Cardiovascular;  Laterality: N/A;  . LEFT HEART CATH AND CORONARY ANGIOGRAPHY N/A 11/20/2017   Procedure: LEFT HEART CATH AND CORONARY ANGIOGRAPHY;  Surgeon: Wellington Hampshire, MD;  Location: [place] [facility];  Service: Cardiovascular;  Laterality: N/A;   Family History  Problem Relation Age of Onset  . Heart disease Father        s/p pacemaker  . Arrhythmia Father   . Asthma Father   . Asthma Mother    Social History   Tobacco Use  . Smoking status: Never Smoker  . Smokeless tobacco: Never Used  Substance Use Topics  .  Alcohol use: No   No Known Allergies   Prior to Admission medications   Medication Sig Start Date End Date Taking? Authorizing Provider  sacubitril-valsartan (ENTRESTO) 24-26 MG Take 1 tablet by mouth 2 (two) times daily PRN 11/16/19  Yes Iloabachie, Chioma E, NP  aspirin 81 MG chewable tablet Take one pill by mouth every day. Patient not taking: Reported on 05/24/2020 11/16/19   Iloabachie, Chioma E, NP  atorvastatin (LIPITOR) 20 MG tablet Take 0.5 tablets (10 mg total) by mouth daily. Patient  not taking: Reported on 05/24/2020 11/16/19   Iloabachie, Chioma E, NP  carvedilol (COREG) 12.5 MG tablet Take 1 tablet (12.5 mg total) by mouth 2 (two) times daily. Patient not taking: Reported on 05/24/2020 11/16/19 02/14/20  Iloabachie, Chioma E, NP  clopidogrel (PLAVIX) 75 MG tablet Take 1 tablet (75 mg total) by mouth daily. Patient not taking: Reported on 05/24/2020 11/16/19   Iloabachie, Chioma E, NP  metFORMIN (GLUCOPHAGE) 500 MG tablet Take 1 tablet (500 mg total) by mouth daily with breakfast. Patient not taking: Reported on 05/24/2020 11/16/19   Iloabachie, Chioma E, NP  spironolactone (ALDACTONE) 25 MG tablet Take 0.5 tablets (12.5 mg total) by mouth daily. Patient not taking: Reported on 05/24/2020 11/16/19   Caryl Asp E, NP    Review of Systems  Constitutional: Negative for activity change (Patient reports he has been walking 30 minutes a day outside the park x2 years), appetite change and fatigue.  HENT: Negative for congestion, postnasal drip and sore throat.   Eyes: Negative.   Respiratory: Negative for cough, chest tightness, shortness of breath and wheezing.   Cardiovascular: Negative for chest pain, palpitations and leg swelling.  Gastrointestinal: Negative for abdominal distention and abdominal pain.  Endocrine: Negative.   Genitourinary: Negative.   Musculoskeletal: Negative for [name] pain, gait problem and neck pain.  Skin: Negative.   Allergic/Immunologic: Negative.   Neurological: Negative for dizziness, weakness, light-headedness and headaches.  Hematological: Negative for adenopathy. Does not bruise/bleed easily.  Psychiatric/Behavioral: Negative for dysphoric mood and sleep disturbance (sleeping on 2 pillows). The patient is not nervous/anxious.    Vitals:   05/24/20 1039  BP: 137/86  Pulse: 69  Resp: 18  SpO2: 100%  Weight: 140 lb (63.5 kg)  Height: 5' (1.524 m)   Wt Readings from Last 3 Encounters:  05/24/20 140 lb (63.5 kg)  11/23/19 147 lb (66.7 kg)   11/16/19 146 lb (66.2 kg)   Lab Results  Component Value Date   CREATININE 0.83 05/11/2019   CREATININE 0.87 08/19/2018   CREATININE 0.84 02/16/2018    Physical Exam Vitals and nursing note reviewed.  Constitutional:      Appearance: He is well-developed.  HENT:     Head: Normocephalic and atraumatic.  Neck:     Vascular: No JVD.  Cardiovascular:     Rate and Rhythm: Normal rate and regular rhythm.  Pulmonary:     Effort: Pulmonary effort is normal. No respiratory distress.     Breath sounds: No wheezing or rales.  Abdominal:     General: There is no distension.     Palpations: Abdomen is soft.     Tenderness: There is no abdominal tenderness.  Musculoskeletal:        General: No tenderness.     Cervical [name]: Normal range of motion and neck supple.  Skin:    General: Skin is warm and dry.  Neurological:     Mental Status: He is alert and oriented to person, place,  and time.  Psychiatric:        Behavior: Behavior normal.        Thought Content: Thought content normal.     Assessment & Plan:  1: Chronic heart failure with reduced ejection fraction- - NYHA class I - euvolemic today - weighing daily. Reminded to call for an overnight weight gain of >2 pounds or a weekly weight gain of >5 pounds - weight down 7 pounds from last visit here 6 months ago - not adding salt; reminded to closely follow a 2000mg  sodium diet. Can use Mrs Deliah Boston for seasoning - no longer taking any medications except entresto PRN based on muscle pain/ fatigue; explained the use of his medications and why they were needed; patient agreeable to resuming entresto BID consistently - explained that it was certainly his choice about whether to take the medications but that these medications were all given to prevent further health problems and to help his heart beat as strongly and efficiently as possible  2: HTN-  - BP looks good today; resuming entresto per above - went to [facility]  11/16/2019; called and scheduled f/u appointment on 06/06/20 - BMP from 05/11/19 reviewed and showed sodium 139, potassium 4.2, creatinine 0.83 and GFR 95   3. Pre-diabeties - A1c 11/02/19 was 6.2%   Medication list reviewed.   Return in 3 months or sooner for any questions/problems before then.

## 2020-06-06 ENCOUNTER — Ambulatory Visit: Payer: Self-pay | Admitting: Internal Medicine

## 2020-06-15 ENCOUNTER — Other Ambulatory Visit: Payer: Self-pay

## 2020-07-26 ENCOUNTER — Other Ambulatory Visit: Payer: Self-pay

## 2020-07-26 ENCOUNTER — Other Ambulatory Visit: Payer: Self-pay | Admitting: Gerontology

## 2020-07-26 DIAGNOSIS — I2102 ST elevation (STEMI) myocardial infarction involving left anterior descending coronary artery: Secondary | ICD-10-CM

## 2020-07-26 DIAGNOSIS — I5022 Chronic systolic (congestive) heart failure: Secondary | ICD-10-CM

## 2020-07-27 ENCOUNTER — Other Ambulatory Visit: Payer: Self-pay

## 2020-08-09 ENCOUNTER — Encounter: Payer: Self-pay | Admitting: Pharmacist

## 2020-08-09 ENCOUNTER — Ambulatory Visit: Payer: Self-pay | Attending: Family | Admitting: Family

## 2020-08-09 ENCOUNTER — Other Ambulatory Visit: Payer: Self-pay

## 2020-08-09 ENCOUNTER — Other Ambulatory Visit: Payer: Self-pay | Admitting: Gerontology

## 2020-08-09 ENCOUNTER — Encounter: Payer: Self-pay | Admitting: Family

## 2020-08-09 VITALS — BP 125/76 | HR 78 | Resp 18 | Ht 60.0 in | Wt 144.4 lb

## 2020-08-09 DIAGNOSIS — I252 Old myocardial infarction: Secondary | ICD-10-CM | POA: Insufficient documentation

## 2020-08-09 DIAGNOSIS — I110 Hypertensive heart disease with heart failure: Secondary | ICD-10-CM | POA: Insufficient documentation

## 2020-08-09 DIAGNOSIS — Z955 Presence of coronary angioplasty implant and graft: Secondary | ICD-10-CM | POA: Insufficient documentation

## 2020-08-09 DIAGNOSIS — Z7901 Long term (current) use of anticoagulants: Secondary | ICD-10-CM | POA: Insufficient documentation

## 2020-08-09 DIAGNOSIS — I5022 Chronic systolic (congestive) heart failure: Secondary | ICD-10-CM | POA: Insufficient documentation

## 2020-08-09 DIAGNOSIS — R7303 Prediabetes: Secondary | ICD-10-CM

## 2020-08-09 DIAGNOSIS — Z79899 Other long term (current) drug therapy: Secondary | ICD-10-CM | POA: Insufficient documentation

## 2020-08-09 DIAGNOSIS — Z7982 Long term (current) use of aspirin: Secondary | ICD-10-CM | POA: Insufficient documentation

## 2020-08-09 DIAGNOSIS — Z8249 Family history of ischemic heart disease and other diseases of the circulatory system: Secondary | ICD-10-CM | POA: Insufficient documentation

## 2020-08-09 DIAGNOSIS — Z7902 Long term (current) use of antithrombotics/antiplatelets: Secondary | ICD-10-CM | POA: Insufficient documentation

## 2020-08-09 DIAGNOSIS — I10 Essential (primary) hypertension: Secondary | ICD-10-CM

## 2020-08-09 LAB — BASIC METABOLIC PANEL
Anion gap: 7 (ref 5–15)
BUN: 14 mg/dL (ref 8–23)
CO2: 28 mmol/L (ref 22–32)
Calcium: 9.3 mg/dL (ref 8.9–10.3)
Chloride: 104 mmol/L (ref 98–111)
Creatinine, Ser: 0.68 mg/dL (ref 0.61–1.24)
GFR, Estimated: >60 mL/min (ref >60)
Glucose, Bld: 109 mg/dL — ABNORMAL HIGH (ref 70–99)
Potassium: 3.6 mmol/L (ref 3.5–5.1)
Sodium: 139 mmol/L (ref 135–145)

## 2020-08-09 MED ORDER — SPIRONOLACTONE 25 MG PO TABS: ORAL_TABLET | ORAL | 3 refills | Status: DC

## 2020-08-09 MED ORDER — METFORMIN HCL 500 MG PO TABS: 500.0000 mg | ORAL_TABLET | Freq: Every day | ORAL | 0 refills | Status: DC

## 2020-08-09 MED ORDER — CARVEDILOL 12.5 MG PO TABS: ORAL_TABLET | ORAL | 3 refills | Status: DC

## 2020-08-09 MED ORDER — ATORVASTATIN CALCIUM 10 MG PO TABS: ORAL_TABLET | ORAL | 3 refills | Status: DC

## 2020-08-09 MED ORDER — ASPIRIN 81 MG PO CHEW: CHEWABLE_TABLET | ORAL | 3 refills | Status: AC

## 2020-08-09 MED ORDER — SACUBITRIL-VALSARTAN 24-26 MG PO TABS: 1.0000 | ORAL_TABLET | Freq: Two times a day (BID) | ORAL | 3 refills | Status: DC

## 2020-08-09 MED ORDER — CLOPIDOGREL BISULFATE 75 MG PO TABS: 75.0000 mg | ORAL_TABLET | Freq: Every day | ORAL | 3 refills | Status: DC

## 2020-08-09 NOTE — Progress Notes (Signed)
[place] - PHARMACIST COUNSELING NOTE  Guideline-Directed Medical Therapy/Evidence Based Medicine  ACE/ARB/ARNI: Sacubitril-valsartan 24-26 mg twice daily Beta Blocker: Carvedilol 12.5 mg twice daily Aldosterone Antagonist: Spironolactone 12.5 mg daily Diuretic:  N/A SGLT2i:  N/A  Adherence Assessment  Do you ever forget to take your medication? [] Yes [x] No  Do you ever skip doses due to side effects? [] Yes [x] No  Do you have trouble affording your medicines? [] Yes [x] No  Are you ever unable to pick up your medication due to transportation difficulties? [] Yes [x] No  Do you ever stop taking your medications because you don't believe they are helping? [] Yes [x] No  Do you check your weight daily? [x] Yes [] No   Adherence strategy: Pillbox  Barriers to obtaining medications: N/A  Vital signs: HR 78, BP 125/76, weight (pounds) 144.6 ECHO: Date 05/29/2017, EF 40-45%, notes MV mild regirgitation Cath: Date 11/20/2017, notes: Previously placed Prox LAD drug eluting stent is widely patent. Balloon angioplasty was performed. Ost 1st Diag lesion is 20% stenosed. Prox RCA lesion is 30% stenosed. There is mild left ventricular systolic dysfunction. LV end diastolic pressure is mildly elevated. The left ventricular ejection fraction is 45-50% by visual estimate.  BMP Latest Ref Rng & Units 05/11/2019 08/19/2018 02/16/2018  Glucose 65 - 99 mg/dL 162(H) 156(H) 163(H)  BUN 8 - 27 mg/dL 13 19 11   Creatinine 0.76 - 1.27 mg/dL 0.83 0.87 0.84  BUN/Creat Ratio 10 - 24 16 22 13   Sodium 134 - 144 mmol/L 139 137 142  Potassium 3.5 - 5.2 mmol/L 4.2 4.1 4.2  Chloride 96 - 106 mmol/L 100 99 104  CO2 20 - 29 mmol/L 26 24 23   Calcium 8.6 - 10.2 mg/dL 9.8 9.0 9.2    Past Medical History:  Diagnosis Date   Acute ST elevation myocardial infarction (STEMI) involving left anterior descending (LAD) coronary artery ([place])    a. 01/2017 Ant STEMI/PCI: LAD  20ost, 100p (3.0x23 Xience Sierra DES).   CAD (coronary artery disease)    a. 01/2017 Ant STEMI/PCI: LM 20d, LAD 20ost, 100p (3.0x23 Xience Sierra DES), 12m, D1 50ost, LCX nl, RCA 30p; b. 11/2017 MV: Intermediate risk w/ 56mm inf ST dep; c. 11/2017 Cath: LM nl, LAD patent prox stent, D1 20ost, LCX nl, RCA 30p. EF 45-50%.   Chronic combined systolic (congestive) and diastolic (congestive) heart failure ([place])    a. 01/2018 Echo: EF 30-35%, sev antsept, ant, apical HK. Gr1 DD, mild MR.   Essential hypertension    Hyperlipidemia    a. Myalgias with high dose lipitor - tolerating 40mg  daily.   Ischemic cardiomyopathy    a. 01/2018 Echo: EF 30-35%.   Type II diabetes mellitus [facility])     ASSESSMENT 65 year old male who presents to the HF clinic for a follow up visit. Pt denies any symptoms or episodes of hypotension. Pt brought in 4 of his medication bottles but states he left two other medications at home that he is still taking. Pt has 4-5 pills of his medications left at home and is requesting refills. Pt was previously noncompliant with his medications because he was "feeling better".   Recent ED Visit (past 6 months): Date - N/A  PLAN CHF/HTN -continue Entresto 24-26 mg twice daily, spironolactone 12.5 mg daily, carvedilol 12.5 mg twice daily -provider to order labs/BMP since pt has restarted all of his medications  HLD/CAD -continue aspirin 81 mg daily, clopidogrel 75 mg daily, and atorvastatin 10 mg daily  DM -11/02/2019 A1c  6.2 -continue metformin 500 mg daily  -follow up with PCP for repeat A1c    Time spent: 10 minutes  Sherilyn Banker, PharmD Pharmacy Resident  08/09/2020 9:46 AM    Current Outpatient Medications:    aspirin 81 MG chewable tablet, Take one pill by mouth every day., Disp: 90 tablet, Rfl: 1   aspirin 81 MG chewable tablet, TOMAR UNA TABLETA POR BOCA DIARIO, Disp: 90 tablet, Rfl: 1   atorvastatin (LIPITOR) 10 MG tablet, TOMAR UNA TABLETA POR BOCA DIARIO, Disp:  30 tablet, Rfl: 2   atorvastatin (LIPITOR) 20 MG tablet, Take 0.5 tablets (10 mg total) by mouth daily., Disp: 30 tablet, Rfl: 2   carvedilol (COREG) 12.5 MG tablet, Take 1 tablet (12.5 mg total) by mouth 2 (two) times daily., Disp: 180 tablet, Rfl: 3   carvedilol (COREG) 12.5 MG tablet, TOMAR UNA TABLETA POR BOCA DOS VECES AL DIA, Disp: 180 tablet, Rfl: 3   clopidogrel (PLAVIX) 75 MG tablet, Take 1 tablet (75 mg total) by mouth daily., Disp: 30 tablet, Rfl: 4   clopidogrel (PLAVIX) 75 MG tablet, TOMAR UNA TABLETA POR BOCA DIARIO, Disp: 30 tablet, Rfl: 4   metFORMIN (GLUCOPHAGE) 500 MG tablet, Take 1 tablet (500 mg total) by mouth daily with breakfast., Disp: 90 tablet, Rfl: 1   metFORMIN (GLUCOPHAGE) 500 MG tablet, TOMAR 1 TABLETA POR BOCA AL DIA CON DESAYUNO, Disp: 90 tablet, Rfl: 1   sacubitril-valsartan (ENTRESTO) 24-26 MG, Take 1 tablet by mouth 2 (two) times daily., Disp: 180 tablet, Rfl: 0   spironolactone (ALDACTONE) 25 MG tablet, Take 0.5 tablets (12.5 mg total) by mouth daily., Disp: 45 tablet, Rfl: 0   spironolactone (ALDACTONE) 25 MG tablet, TOMAR MEDIO TABLETA(0.5 TABLETA) POR BOCA AL DIARIO, Disp: 45 tablet, Rfl: 0   DRUGS TO CAUTION IN HEART FAILURE  Drug or Class Mechanism  Analgesics NSAIDs COX-2 inhibitors Glucocorticoids  Sodium and water retention, increased systemic vascular resistance, decreased response to diuretics   Diabetes Medications Metformin Thiazolidinediones Rosiglitazone (Avandia) Pioglitazone (Actos) DPP4 Inhibitors Saxagliptin (Onglyza) Sitagliptin (Januvia)   Lactic acidosis Possible calcium channel blockade   Unknown  Antiarrhythmics Class I  Flecainide Disopyramide Class III Sotalol Other Dronedarone  Negative inotrope, proarrhythmic   Proarrhythmic, beta blockade  Negative inotrope  Antihypertensives Alpha Blockers Doxazosin Calcium Channel Blockers Diltiazem Verapamil Nifedipine Central Alpha  Adrenergics Moxonidine Peripheral Vasodilators Minoxidil  Increases renin and aldosterone  Negative inotrope    Possible sympathetic withdrawal  Unknown  Anti-infective Itraconazole Amphotericin B  Negative inotrope Unknown  Hematologic Anagrelide Cilostazol   Possible inhibition of PD IV Inhibition of PD III causing arrhythmias  Neurologic/Psychiatric Stimulants Anti-Seizure Drugs Carbamazepine Pregabalin Antidepressants Tricyclics Citalopram Parkinsons Bromocriptine Pergolide Pramipexole Antipsychotics Clozapine Antimigraine Ergotamine Methysergide Appetite suppressants Bipolar Lithium  Peripheral alpha and beta agonist activity  Negative inotrope and chronotrope Calcium channel blockade  Negative inotrope, proarrhythmic Dose-dependent QT prolongation  Excessive serotonin activity/valvular damage Excessive serotonin activity/valvular damage Unknown  IgE mediated hypersensitivy, calcium channel blockade  Excessive serotonin activity/valvular damage Excessive serotonin activity/valvular damage Valvular damage  Direct myofibrillar degeneration, adrenergic stimulation  Antimalarials Chloroquine Hydroxychloroquine Intracellular inhibition of lysosomal enzymes  Urologic Agents Alpha Blockers Doxazosin Prazosin Tamsulosin Terazosin  Increased renin and aldosterone  Adapted from Page Carleene Overlie, et al. "Drugs That May Cause or Exacerbate Heart Failure: A Scientific Statement from the American Heart  Association." Circulation 2016; 134:e32-e69. DOI: 10.1161/CIR.0000000000000426   MEDICATION ADHERENCES TIPS AND STRATEGIES Taking medication as prescribed improves patient outcomes in heart failure (reduces hospitalizations, improves  symptoms, increases survival) Side effects of medications can be managed by decreasing doses, switching agents, stopping drugs, or adding additional therapy. Please let someone in the [facility] know if you have having  bothersome side effects so we can modify your regimen. Do not alter your medication regimen without talking to [place].  Medication reminders can help patients remember to take drugs on time. If you are missing or forgetting doses you can try linking behaviors, using pill boxes, or an electronic reminder like an alarm on your phone or an app. Some people can also get automated phone calls as medication reminders.

## 2020-08-09 NOTE — Progress Notes (Signed)
Patient ID: Lucas Lucas [name], male    DOB: 06/18/1955, 65 y.o.   MRN: [id number]  HPI  Entire visit done with [facility] interpreter present  Lucas Lucas [name] is [name] 65 y/o male with [name] history of STEMI, HTN and heart failure.  Echo report on 05/29/17 reviewed and showed an EF of 40-45% along with mild Lucas. Echo report done 01/26/17 reviewed and shows an EF of 30-35% along with mild Lucas.   Cardiac catheterization done 01/25/17 showed mild to moderate non-obstructive disease involving the mid/distal LAD and RCA. Moderately elevated left ventricular filling pressure. 100% thrombotic occlusion of the proximal LAD. DES was successfully placed.   He hasn't been admitted or been in the ED in the last 12 months.  He presents today with [name] chief complaint of [name] follow-up visit. He currently denies any symptoms and specifically denies any difficulty sleeping, dizziness, abdominal distention, palpitations, pedal edema, chest pain, shortness of breath, cough, fatigue or weight gain.   Has resumed all his medications and says that he's feeling well although does need refills on everything.   Past Medical History:  Diagnosis Date   Acute ST elevation myocardial infarction (STEMI) involving left anterior descending (LAD) coronary artery ([place])    [name]. 01/2017 Ant STEMI/PCI: LAD 20ost, 100p (3.0x23 Xience [place]).   CAD (coronary artery disease)    [name]. 01/2017 Ant STEMI/PCI: LM 20d, LAD 20ost, 100p (3.0x23 Xience Sierra DES), 11m, D1 50ost, LCX nl, RCA 30p; b. 11/2017 MV: Intermediate risk w/ 58mm inf ST dep; c. 11/2017 Cath: LM nl, LAD patent prox stent, D1 20ost, LCX nl, RCA 30p. EF 45-50%.   Chronic combined systolic (congestive) and diastolic (congestive) heart failure ([place])    [name]. 01/2018 Echo: EF 30-35%, sev antsept, ant, apical HK. Gr1 DD, mild Lucas.   Essential hypertension    Hyperlipidemia    [name]. Myalgias with high dose lipitor - tolerating 40mg  daily.   Ischemic cardiomyopathy    [name]. 01/2018 Echo: EF  30-35%.   Type II diabetes mellitus ([place])    Past Surgical History:  Procedure Laterality Date   APPENDECTOMY     CARDIAC CATHETERIZATION     CORONARY ANGIOPLASTY     CORONARY/GRAFT ACUTE MI REVASCULARIZATION N/[name] 01/25/2017   Procedure: Coronary/Graft Acute MI Revascularization;  Surgeon: Lucas Bush, MD;  Location: [place] [facility];  Service: Cardiovascular;  Laterality: N/[name];   LEFT HEART CATH AND CORONARY ANGIOGRAPHY N/[name] 01/25/2017   Procedure: LEFT HEART CATH AND CORONARY ANGIOGRAPHY;  Surgeon: Lucas Bush, MD;  Location: [place] [facility];  Service: Cardiovascular;  Laterality: N/[name];   LEFT HEART CATH AND CORONARY ANGIOGRAPHY N/[name] 11/20/2017   Procedure: LEFT HEART CATH AND CORONARY ANGIOGRAPHY;  Surgeon: Lucas Hampshire, MD;  Location: [place] [facility];  Service: Cardiovascular;  Laterality: N/[name];   Family History  Problem Relation Age of Onset   Heart disease Father        s/p pacemaker   Arrhythmia Father    Asthma Father    Asthma Mother    Social History   Tobacco Use   Smoking status: Never   Smokeless tobacco: Never  Substance Use Topics   Alcohol use: No   No Known Allergies   Prior to Admission medications   Medication Sig Start Date End Date Taking? Authorizing Provider  aspirin 81 MG chewable tablet TOMAR UNA TABLETA POR BOCA DIARIO 08/09/20 08/09/21  Lucas Lucas [name]  atorvastatin (LIPITOR) 10 MG tablet TOMAR UNA TABLETA POR BOCA DIARIO  08/09/20 08/09/21  Lucas Lucas [name]  carvedilol (COREG) 12.5 MG tablet TOMAR UNA TABLETA POR BOCA DOS VECES AL DIA 08/09/20 08/09/21  Lucas Lucas [name]  clopidogrel (PLAVIX) 75 MG tablet Take 1 tablet (75 mg total) by mouth daily. 08/09/20   Lucas Lucas [name]  metFORMIN (GLUCOPHAGE) 500 MG tablet Take 1 tablet (500 mg total) by mouth daily with breakfast. 08/09/20   Lucas Lucas [name]  sacubitril-valsartan (ENTRESTO) 24-26 MG Take 1 tablet by mouth 2 (two) times daily. 08/09/20   Lucas Lucas [name]   spironolactone (ALDACTONE) 25 MG tablet TOMAR MEDIO TABLETA(0.5 TABLETA) POR BOCA AL DIARIO 08/09/20 07/25/21  Lucas Lucas [name]    Review of Systems  Constitutional:  Negative for appetite change and fatigue.  HENT:  Negative for congestion, postnasal drip and sore throat.   Eyes: Negative.   Respiratory:  Negative for cough, chest tightness and shortness of breath.   Cardiovascular:  Negative for chest pain, palpitations and leg swelling.  Gastrointestinal:  Negative for abdominal distention and abdominal pain.  Endocrine: Negative.   Genitourinary: Negative.   Musculoskeletal:  Negative for Lucas [name] pain and neck pain.  Skin: Negative.   Allergic/Immunologic: Negative.   Neurological:  Negative for dizziness, light-headedness and headaches.  Hematological:  Negative for adenopathy. Does not bruise/bleed easily.  Psychiatric/Behavioral:  Negative for dysphoric mood and sleep disturbance. The patient is not nervous/anxious.    Vitals:   08/09/20 0930  BP: 125/76  Pulse: 78  Resp: 18  SpO2: 99%  Weight: 144 lb 6 oz (65.5 kg)  Height: 5' (1.524 m)   Wt Readings from Last 3 Encounters:  08/09/20 144 lb 6 oz (65.5 kg)  05/24/20 140 lb (63.5 kg)  11/23/19 147 lb (66.7 kg)   Lab Results  Component Value Date   CREATININE 0.83 05/11/2019   CREATININE 0.87 08/19/2018   CREATININE 0.84 02/16/2018    Physical Exam Vitals and nursing note reviewed. Exam conducted with [name] chaperone present (son & interpreter).  Constitutional:      Appearance: Normal appearance.  HENT:     Head: Normocephalic and atraumatic.  Cardiovascular:     Rate and Rhythm: Normal rate and regular rhythm.  Pulmonary:     Effort: Pulmonary effort is normal. No respiratory distress.     Breath sounds: No wheezing or rales.  Abdominal:     General: There is no distension.     Palpations: Abdomen is soft.     Tenderness: There is no abdominal tenderness.  Musculoskeletal:        General: No tenderness.      Cervical Lucas [name]: Normal range of motion and neck supple.     Right lower leg: No edema.     Left lower leg: No edema.  Skin:    General: Skin is warm and dry.  Neurological:     General: No focal deficit present.     Mental Status: He is alert and oriented to person, place, and time.  Psychiatric:        Mood and Affect: Mood normal.        Behavior: Behavior normal.        Thought Content: Thought content normal.    Assessment & Plan:  1: Chronic heart failure with reduced ejection fraction- - NYHA class I - euvolemic today - weighing daily. Reminded to call for an overnight weight gain of >2 pounds or [name] weekly weight gain of >5 pounds - weight up 4 pounds  from last visit here 3 months ago - not adding salt; reminded to closely follow [name] 2000mg  sodium diet. Can use Mrs Deliah Boston for seasoning - resumed entresto at last visit and patient actually resumed everything - get BMP today - on GDMT of carvedilol, entresto and spironolactone - discuss adding SLGT2 - PharmD reconciled medications with the patient  2: HTN-  - BP looks good today - went to [facility] 11/16/2019; he did not show for f/u appointment on 06/06/20; provider there says her office will reach out to him - BMP from 05/11/19 reviewed and showed sodium 139, potassium 4.2, creatinine 0.83 and GFR 95   3. Pre-diabeties - A1c 11/02/19 was 6.2%   Medication bottles reviewed.   Return in 1 month or sooner for any questions/problems before then.

## 2020-08-09 NOTE — Patient Instructions (Signed)
Continue weighing daily and call for an overnight weight gain of > 2 pounds or a weekly weight gain of >5 pounds. 

## 2020-08-10 ENCOUNTER — Encounter: Payer: Self-pay | Admitting: Family

## 2020-08-15 ENCOUNTER — Telehealth: Payer: Self-pay | Admitting: Gerontology

## 2020-08-15 NOTE — Telephone Encounter (Signed)
LVM regarding scheduling an appt and provided call back number

## 2020-08-16 ENCOUNTER — Ambulatory Visit: Payer: Self-pay | Admitting: Family

## 2020-08-29 ENCOUNTER — Other Ambulatory Visit: Payer: Self-pay

## 2020-09-02 ENCOUNTER — Other Ambulatory Visit: Payer: Self-pay | Admitting: Gerontology

## 2020-09-02 DIAGNOSIS — R7303 Prediabetes: Secondary | ICD-10-CM

## 2020-09-02 NOTE — Progress Notes (Deleted)
Patient ID: Lucas [name], male    DOB: [birth year], 66 y.o.   MRN: [id number]  HPI  Entire visit done with [facility] interpreter present  [name] is a 65 y/o male with a history of STEMI, HTN and heart failure.  Echo report on 05/29/17 reviewed and showed an EF of 40-45% along with mild MR. Echo report done 01/26/17 reviewed and shows an EF of 30-35% along with mild MR.   Cardiac catheterization done 01/25/17 showed mild to moderate non-obstructive disease involving the mid/distal LAD and RCA. Moderately elevated left ventricular filling pressure. 100% thrombotic occlusion of the proximal LAD. DES was successfully placed.   He hasn't been admitted or been in the ED in the last 6 months.  He presents today with a chief complaint of a follow-up visit.   Past Medical History:  Diagnosis Date   Acute ST elevation myocardial infarction (STEMI) involving left anterior descending (LAD) coronary artery ([place])    a. 01/2017 Ant STEMI/PCI: LAD 20ost, 100p (3.0x23 Xience [place]).   CAD (coronary artery disease)    a. 01/2017 Ant STEMI/PCI: LM 20d, LAD 20ost, 100p (3.0x23 Xience [place] DES), 28m, D1 50ost, LCX nl, RCA 30p; b. 11/2017 MV: Intermediate risk w/ 55mm inf ST dep; c. 11/2017 Cath: LM nl, LAD patent prox stent, D1 20ost, LCX nl, RCA 30p. EF 45-50%.   Chronic combined systolic (congestive) and diastolic (congestive) heart failure ([place])    a. 01/2018 Echo: EF 30-35%, sev antsept, ant, apical HK. Gr1 DD, mild MR.   Essential hypertension    Hyperlipidemia    a. Myalgias with high dose lipitor - tolerating 40mg  daily.   Ischemic cardiomyopathy    a. 01/2018 Echo: EF 30-35%.   Type II diabetes mellitus ([place])    Past Surgical History:  Procedure Laterality Date   APPENDECTOMY     CARDIAC CATHETERIZATION     CORONARY ANGIOPLASTY     CORONARY/GRAFT ACUTE MI REVASCULARIZATION N/A 01/25/2017   Procedure: Coronary/Graft Acute MI Revascularization;  Surgeon: Nelva Bush, MD;   Location: [place] [facility];  Service: Cardiovascular;  Laterality: N/A;   LEFT HEART CATH AND CORONARY ANGIOGRAPHY N/A 01/25/2017   Procedure: LEFT HEART CATH AND CORONARY ANGIOGRAPHY;  Surgeon: Nelva Bush, MD;  Location: [place] [facility];  Service: Cardiovascular;  Laterality: N/A;   LEFT HEART CATH AND CORONARY ANGIOGRAPHY N/A 11/20/2017   Procedure: LEFT HEART CATH AND CORONARY ANGIOGRAPHY;  Surgeon: Wellington Hampshire, MD;  Location: [place] [facility];  Service: Cardiovascular;  Laterality: N/A;   Family History  Problem Relation Age of Onset   Heart disease Father        s/p pacemaker   Arrhythmia Father    Asthma Father    Asthma Mother    Social History   Tobacco Use   Smoking status: Never   Smokeless tobacco: Never  Substance Use Topics   Alcohol use: No   No Known Allergies     Review of Systems  Constitutional:  Negative for appetite change and fatigue.  HENT:  Negative for congestion, postnasal drip and sore throat.   Eyes: Negative.   Respiratory:  Negative for cough, chest tightness and shortness of breath.   Cardiovascular:  Negative for chest pain, palpitations and leg swelling.  Gastrointestinal:  Negative for abdominal distention and abdominal pain.  Endocrine: Negative.   Genitourinary: Negative.   Musculoskeletal:  Negative for [name] pain and neck pain.  Skin: Negative.   Allergic/Immunologic: Negative.   Neurological:  Negative for dizziness, light-headedness and headaches.  Hematological:  Negative for adenopathy. Does not bruise/bleed easily.  Psychiatric/Behavioral:  Negative for dysphoric mood and sleep disturbance. The patient is not nervous/anxious.       Physical Exam Vitals and nursing note reviewed. Exam conducted with a chaperone present (son & interpreter).  Constitutional:      Appearance: Normal appearance.  HENT:     Head: Normocephalic and atraumatic.  Cardiovascular:     Rate and Rhythm: Normal rate and regular  rhythm.  Pulmonary:     Effort: Pulmonary effort is normal. No respiratory distress.     Breath sounds: No wheezing or rales.  Abdominal:     General: There is no distension.     Palpations: Abdomen is soft.     Tenderness: There is no abdominal tenderness.  Musculoskeletal:        General: No tenderness.     Cervical [name]: Normal range of motion and neck supple.     Right lower leg: No edema.     Left lower leg: No edema.  Skin:    General: Skin is warm and dry.  Neurological:     General: No focal deficit present.     Mental Status: He is alert and oriented to person, place, and time.  Psychiatric:        Mood and Affect: Mood normal.        Behavior: Behavior normal.        Thought Content: Thought content normal.    Assessment & Plan:  1: Chronic heart failure with reduced ejection fraction- - NYHA class I - euvolemic today - weighing daily. Reminded to call for an overnight weight gain of >2 pounds or a weekly weight gain of >5 pounds - weight 144.6 pounds from last visit here 1 month ago - not adding salt; reminded to closely follow a 2000mg  sodium diet. Can use Mrs Deliah Boston for seasoning - on GDMT of carvedilol, entresto and spironolactone - discuss adding SLGT2   2: HTN-  - BP  - went to [facility] 11/16/2019 - BMP from 08/09/20 reviewed and showed sodium 139, potassium 3.6, creatinine 0.68 and GFR >60   3. Pre-diabeties - A1c 11/02/19 was 6.2%   Medication bottles reviewed.

## 2020-09-04 ENCOUNTER — Ambulatory Visit: Payer: Self-pay | Admitting: Family

## 2020-09-05 NOTE — Telephone Encounter (Signed)
Patient must schedule an appointment

## 2020-09-06 ENCOUNTER — Ambulatory Visit: Payer: Self-pay | Admitting: Family

## 2020-09-19 ENCOUNTER — Telehealth: Payer: Self-pay | Admitting: Gerontology

## 2020-09-19 NOTE — Telephone Encounter (Signed)
Pt was contacted and an appt was made for next Wednesday, July 27 at 2 pm. A voicemail regarding the appt information was left.

## 2020-09-19 NOTE — Telephone Encounter (Signed)
-----   Message from Langston Reusing, NP sent at 09/04/2020  9:41 AM EDT ----- [name] no showed to his appointments, pls can you schedule an in patient appointment and make a telephone note. Thank you

## 2020-09-20 ENCOUNTER — Other Ambulatory Visit: Payer: Self-pay

## 2020-09-26 ENCOUNTER — Ambulatory Visit: Payer: Self-pay | Admitting: Gerontology

## 2020-10-16 ENCOUNTER — Encounter: Payer: Self-pay | Admitting: Emergency Medicine

## 2020-10-16 ENCOUNTER — Other Ambulatory Visit: Payer: Self-pay

## 2020-10-16 ENCOUNTER — Emergency Department: Payer: Self-pay

## 2020-10-16 ENCOUNTER — Emergency Department
Admission: EM | Admit: 2020-10-16 | Discharge: 2020-10-16 | Disposition: A | Discharge status: Home / Self Care | Payer: Self-pay | Attending: Emergency Medicine | Admitting: Emergency Medicine

## 2020-10-16 DIAGNOSIS — E119 Type 2 diabetes mellitus without complications: Secondary | ICD-10-CM | POA: Insufficient documentation

## 2020-10-16 DIAGNOSIS — I25110 Atherosclerotic heart disease of native coronary artery with unstable angina pectoris: Secondary | ICD-10-CM | POA: Insufficient documentation

## 2020-10-16 DIAGNOSIS — I110 Hypertensive heart disease with heart failure: Secondary | ICD-10-CM | POA: Insufficient documentation

## 2020-10-16 DIAGNOSIS — R1031 Right lower quadrant pain: Secondary | ICD-10-CM | POA: Insufficient documentation

## 2020-10-16 DIAGNOSIS — I5042 Chronic combined systolic (congestive) and diastolic (congestive) heart failure: Secondary | ICD-10-CM | POA: Insufficient documentation

## 2020-10-16 DIAGNOSIS — Z7982 Long term (current) use of aspirin: Secondary | ICD-10-CM | POA: Insufficient documentation

## 2020-10-16 DIAGNOSIS — Z7902 Long term (current) use of antithrombotics/antiplatelets: Secondary | ICD-10-CM | POA: Insufficient documentation

## 2020-10-16 DIAGNOSIS — R1011 Right upper quadrant pain: Secondary | ICD-10-CM | POA: Insufficient documentation

## 2020-10-16 DIAGNOSIS — Z7984 Long term (current) use of oral hypoglycemic drugs: Secondary | ICD-10-CM | POA: Insufficient documentation

## 2020-10-16 DIAGNOSIS — Z79899 Other long term (current) drug therapy: Secondary | ICD-10-CM | POA: Insufficient documentation

## 2020-10-16 DIAGNOSIS — R1033 Periumbilical pain: Secondary | ICD-10-CM | POA: Insufficient documentation

## 2020-10-16 LAB — COMPREHENSIVE METABOLIC PANEL
ALT: 17 U/L (ref 0–44)
AST: 18 U/L (ref 15–41)
Albumin: 4 g/dL (ref 3.5–5.0)
Alkaline Phosphatase: 48 U/L (ref 38–126)
Anion gap: 9 (ref 5–15)
BUN: 10 mg/dL (ref 8–23)
CO2: 26 mmol/L (ref 22–32)
Calcium: 8.9 mg/dL (ref 8.9–10.3)
Chloride: 102 mmol/L (ref 98–111)
Creatinine, Ser: 0.81 mg/dL (ref 0.61–1.24)
GFR, Estimated: >60 mL/min (ref >60)
Glucose, Bld: 133 mg/dL — ABNORMAL HIGH (ref 70–99)
Potassium: 3.8 mmol/L (ref 3.5–5.1)
Sodium: 137 mmol/L (ref 135–145)
Total Bilirubin: 0.8 mg/dL (ref 0.3–1.2)
Total Protein: 7 g/dL (ref 6.5–8.1)

## 2020-10-16 LAB — URINALYSIS, COMPLETE (UACMP) WITH MICROSCOPIC
Bacteria, UA: NONE SEEN
Bilirubin Urine: NEGATIVE
Glucose, UA: NEGATIVE mg/dL
Hgb urine dipstick: NEGATIVE
Ketones, ur: NEGATIVE mg/dL
Leukocytes,Ua: NEGATIVE
Nitrite: NEGATIVE
Protein, ur: NEGATIVE mg/dL
Specific Gravity, Urine: 1.025 (ref 1.005–1.030)
Squamous Epithelial / HPF: NONE SEEN (ref 0–5)
pH: 7 (ref 5.0–8.0)

## 2020-10-16 LAB — CBC
HCT: 45 % (ref 39.0–52.0)
Hemoglobin: 16.1 g/dL (ref 13.0–17.0)
MCH: 32.8 pg (ref 26.0–34.0)
MCHC: 35.8 g/dL (ref 30.0–36.0)
MCV: 91.6 fL (ref 80.0–100.0)
Platelets: 190 10*3/uL (ref 150–400)
RBC: 4.91 MIL/uL (ref 4.22–5.81)
RDW: 12.2 % (ref 11.5–15.5)
WBC: 6.3 10*3/uL (ref 4.0–10.5)
nRBC: 0 % (ref 0.0–0.2)

## 2020-10-16 LAB — LIPASE, BLOOD: Lipase: 47 U/L (ref 11–51)

## 2020-10-16 MED ORDER — IOHEXOL 350 MG/ML SOLN
80.0000 mL | Freq: Once | INTRAVENOUS | Status: AC | PRN
  Administered 2020-10-16: 80 mL via INTRAVENOUS
  Filled 2020-10-16: qty 80

## 2020-10-16 MED ORDER — TRAMADOL HCL 50 MG PO TABS: 50.0000 mg | ORAL_TABLET | Freq: Four times a day (QID) | ORAL | 0 refills | Status: DC | PRN

## 2020-10-16 MED ORDER — ONDANSETRON HCL 4 MG/2ML IJ SOLN
4.0000 mg | Freq: Once | INTRAMUSCULAR | Status: AC
  Administered 2020-10-16: 4 mg via INTRAVENOUS
  Filled 2020-10-16: qty 2

## 2020-10-16 MED ORDER — MORPHINE SULFATE (PF) 4 MG/ML IV SOLN
4.0000 mg | Freq: Once | INTRAVENOUS | Status: AC
  Administered 2020-10-16: 4 mg via INTRAVENOUS
  Filled 2020-10-16: qty 1

## 2020-10-16 NOTE — ED Notes (Signed)
See triage note  Presents with 3 day hx of abd pain and nausea

## 2020-10-16 NOTE — ED Provider Notes (Signed)
[facility] Emergency Department Provider Note   ____________________________________________    I have reviewed the triage vital signs and the nursing notes.   HISTORY  Chief Complaint Abdominal Pain and Nausea  Spanish interpreter   HPI Lucas [name] is a 65 y.o. male with history as detailed below who presents with complaints of abdominal pain.  Patient describes right lower quadrant abdominal pain and some right upper quadrant abdominal pain which is sharp in nature and has been ongoing for 2 to 3 days now.  No history of abdominal surgery.  Does have a history of diabetes.  No fevers chills.  No vomiting, normal stools.  Has not take anything for this  Past Medical History:  Diagnosis Date   Acute ST elevation myocardial infarction (STEMI) involving left anterior descending (LAD) coronary artery ([place])    a. 01/2017 Ant STEMI/PCI: LAD 20ost, 100p (3.0x23 Xience [place] DES).   CAD (coronary artery disease)    a. 01/2017 Ant STEMI/PCI: LM 20d, LAD 20ost, 100p (3.0x23 Xience [place] DES), 56m D1 50ost, LCX nl, RCA 30p; b. 11/2017 MV: Intermediate risk w/ 145minf ST dep; c. 11/2017 Cath: LM nl, LAD patent prox stent, D1 20ost, LCX nl, RCA 30p. EF 45-50%.   Chronic combined systolic (congestive) and diastolic (congestive) heart failure ([place]   a. 01/2018 Echo: EF 30-35%, sev antsept, ant, apical HK. Gr1 DD, mild MR.   Essential hypertension    Hyperlipidemia    a. Myalgias with high dose lipitor - tolerating '40mg'$  daily.   Ischemic cardiomyopathy    a. 01/2018 Echo: EF 30-35%.   [facility]    Patient Active Problem List   Diagnosis Date Noted   Unstable angina ([facility]09/19/2019   Coronary artery disease of native artery of native heart with stable angina pectoris ([facility]08/29/2019   Supraspinatus tendon tear 08/11/2017   Ischemic cardiomyopathy 04/29/2017   Hyperlipidemia LDL goal <70 04/29/2017   History of ST  elevation myocardial infarction (STEMI) 04/14/2017   Prediabetes 04/14/2017   Cough 020000000 Chronic systolic heart failure ([place]12/05/2016   HTN (hypertension) 02/02/2017   Myalgia 02/02/2017   STEMI involving left anterior descending coronary artery ([facility]11/25/2018    Past Surgical History:  Procedure Laterality Date   APPENDECTOMY     CARDIAC CATHETERIZATION     CORONARY ANGIOPLASTY     CORONARY/GRAFT ACUTE MI REVASCULARIZATION N/A 01/25/2017   Procedure: Coronary/Graft Acute MI Revascularization;  Surgeon: EnNelva BushMD;  [facility];  Service: Cardiovascular;  Laterality: N/A;   LEFT HEART CATH AND CORONARY ANGIOGRAPHY N/A 01/25/2017   Procedure: LEFT HEART CATH AND CORONARY ANGIOGRAPHY;  Surgeon: EnNelva BushMD;  [facility];  Service: Cardiovascular;  Laterality: N/A;   LEFT HEART CATH AND CORONARY ANGIOGRAPHY N/A 11/20/2017   Procedure: LEFT HEART CATH AND CORONARY ANGIOGRAPHY;  Surgeon: ArWellington HampshireMD;  [facility];  Service: Cardiovascular;  Laterality: N/A;    Prior to Admission medications   Medication Sig Start Date End Date Taking? Authorizing Provider  traMADol (ULTRAM) 50 MG tablet Take 1 tablet (50 mg total) by mouth every 6 (six) hours as needed. 10/16/20 10/16/21 Yes KiLavonia DraftsMD  aspirin 81 MG chewable tablet TOMAR UNA TABLETA POR BOCA DIARIO 08/09/20 08/09/21  HaDarylene Price, FNP  atorvastatin (LIPITOR) 10 MG tablet TOMAR UNA TABLETA POR BOCA DIARIO 08/09/20 08/09/21  HaAlisa GraffFNP  carvedilol (COREG) 12.5 MG tablet TOMAR UNA TABLETA POR BOCA DOS VECES AL DIA 08/09/20 08/09/21  Darylene Price A, FNP  clopidogrel (PLAVIX) 75 MG tablet Take 1 tablet (75 mg total) by mouth daily. 08/09/20   Alisa Graff, FNP  metFORMIN (GLUCOPHAGE) 500 MG tablet TOME UNA TABLETA TODOS LOS DIAS CON EL DESAYUNO 09/05/20   Iloabachie, Chioma E, NP  sacubitril-valsartan (ENTRESTO) 24-26 MG Take 1 tablet by  mouth 2 (two) times daily. 08/09/20   Alisa Graff, FNP  spironolactone (ALDACTONE) 25 MG tablet TOMAR MEDIO TABLETA(0.5 TABLETA) POR BOCA AL DIARIO 08/09/20 07/25/21  Alisa Graff, FNP     Allergies Patient has no known allergies.  Family History  Problem Relation Age of Onset   Heart disease Father        s/p pacemaker   Arrhythmia Father    Asthma Father    Asthma Mother     Social History Social History   Tobacco Use   Smoking status: Never   Smokeless tobacco: Never  Vaping Use   Vaping Use: Never used  Substance Use Topics   Alcohol use: No   Drug use: No    Review of Systems  Constitutional: No fever/chills Eyes: No visual changes.  ENT: No sore throat. Cardiovascular: Denies chest pain. Respiratory: Denies shortness of breath. Gastrointestinal: No abdominal pain.  No nausea, no vomiting.   Genitourinary: Negative for dysuria. Musculoskeletal: Negative for back pain. Skin: Negative for rash. Neurological: Negative for headaches or weakness   ____________________________________________   PHYSICAL EXAM:  VITAL SIGNS: ED Triage Vitals  Enc Vitals Group     BP 10/16/20 0933 135/74     Pulse Rate 10/16/20 0933 71     Resp 10/16/20 0933 17     Temp 10/16/20 0933 98.8 F (37.1 C)     Temp Source 10/16/20 0933 Oral     SpO2 10/16/20 0933 99 %     Weight 10/16/20 0936 65 kg (143 lb 4.8 oz)     Height 10/16/20 0936 1.524 m (5')     Head Circumference --      Peak Flow --      Pain Score 10/16/20 0936 5     Pain Loc --      Pain Edu? --      Excl. in [place]? --     Constitutional: Alert and oriented.   Nose: No congestion/rhinnorhea. Mouth/Throat: Mucous membranes are moist.    Cardiovascular: Normal rate, regular rhythm. Grossly normal heart sounds.  Good peripheral circulation. Respiratory: Normal respiratory effort.  No retractions. Lungs CTAB. Gastrointestinal: Soft, mild tenderness right lower quadrant, no significant right upper quadrant  tenderness Genitourinary: deferred Musculoskeletal: Warm and well perfused Neurologic:  Normal speech and language. No gross focal neurologic deficits are appreciated.  Skin:  Skin is warm, dry and intact. No rash noted. Psychiatric: Mood and affect are normal. Speech and behavior are normal.  ____________________________________________   LABS (all labs ordered are listed, but only abnormal results are displayed)  Labs Reviewed  COMPREHENSIVE METABOLIC PANEL - Abnormal; Notable for the following components:      Result Value   Glucose, Bld 133 (*)    All other components within normal limits  URINALYSIS, COMPLETE (UACMP) WITH MICROSCOPIC - Abnormal; Notable for the following components:   Color, Urine COLORLESS (*)    APPearance CLEAR (*)    All other components within normal limits  LIPASE, BLOOD  CBC   ____________________________________________  EKG  ED ECG  REPORT I, Lavonia Drafts, the attending physician, personally viewed and interpreted this ECG.  Date: 10/16/2020  Rhythm: normal sinus rhythm QRS Axis: normal Intervals: normal ST/T Wave abnormalities: normal Narrative Interpretation: no evidence of acute ischemia  ____________________________________________  RADIOLOGY  CT abdomen pelvis ____________________________________________   PROCEDURES  Procedure(s) performed: No  Procedures   Critical Care performed: No ____________________________________________   INITIAL IMPRESSION / ASSESSMENT AND PLAN / ED COURSE  Pertinent labs & imaging results that were available during my care of the patient were reviewed by me and considered in my medical decision making (see chart for details).   Patient presents with primarily right lower quadrant abdominal pain as detailed above, lab work is overall reassuring, LFTs are unremarkable, lipase is normal.  White blood cell count is normal.  Will send for CT abdomen pelvis to evaluate appendix, will treat with IV  morphine, IV Zofran, IV fluids and reevaluate  CT scan overall reassuring, right inguinal hernia, unremarkable exam,   Urinalysis is unremarkable.  Patient is feeling much improved after treatment, he is asking for food.  No acute abnormalities on labs or imaging.  Appropriate discharge at this time with strict return precautions, patient agrees with plan.  Outpatient follow-up for inguinal hernia repair    ____________________________________________   FINAL CLINICAL IMPRESSION(S) / ED DIAGNOSES  Final diagnoses:  Periumbilical abdominal pain        Note:  This document was prepared using Dragon voice recognition software and may include unintentional dictation errors.    Lavonia Drafts, MD 10/16/20 1432

## 2020-10-16 NOTE — ED Triage Notes (Signed)
Per interpreter, pt reports pain to his stomach on the right side for 3 days and intermittent nausea

## 2020-10-24 ENCOUNTER — Telehealth: Payer: Self-pay

## 2020-10-24 ENCOUNTER — Telehealth: Payer: Self-pay | Admitting: Internal Medicine

## 2020-10-24 ENCOUNTER — Encounter: Payer: Self-pay | Admitting: Surgery

## 2020-10-24 ENCOUNTER — Other Ambulatory Visit: Payer: Self-pay

## 2020-10-24 ENCOUNTER — Ambulatory Visit (INDEPENDENT_AMBULATORY_CARE_PROVIDER_SITE_OTHER): Payer: Self-pay | Admitting: Surgery

## 2020-10-24 VITALS — BP 136/78 | HR 84 | Temp 98.6°F | Ht 60.0 in | Wt 145.8 lb

## 2020-10-24 DIAGNOSIS — K4090 Unilateral inguinal hernia, without obstruction or gangrene, not specified as recurrent: Secondary | ICD-10-CM

## 2020-10-24 NOTE — Telephone Encounter (Signed)
   [facility] Pre-operative Risk Assessment    Patient Name: Lucas [name]  DOB: 07-27-1955 MRN: [id number]  HEARTCARE STAFF:  - IMPORTANT!!!!!! Under Visit Info/Reason for Call, type in Other and utilize the format Clearance MM/DD/YY or Clearance TBD. Do not use dashes or single digits. - Please review there is not already an duplicate clearance open for this procedure. - If request is for dental extraction, please clarify the # of teeth to be extracted. - If the patient is currently at the dentist's office, call Pre-Op Callback Staff (MA/nurse) to input urgent request.  - If the patient is not currently in the dentist office, please route to the Pre-Op pool.  Request for surgical clearance:  What type of surgery is being performed? Robotic inguinal hernia repair   When is this surgery scheduled? TBD   What type of clearance is required (medical clearance vs. Pharmacy clearance to hold med vs. Both)? both  Are there any medications that need to be held prior to surgery and how long? Not listed, please advise   Practice name and name of physician performing surgery? [facility] - Dr Lucas [name]   What is the office phone number? [phone]   7.   What is the office fax number? [phone]  8.   Anesthesia type (None, local, MAC, general) ? Not listed    Lucas [name] 10/24/2020, 2:17 PM  _________________________________________________________________   (provider comments below)

## 2020-10-24 NOTE — Telephone Encounter (Signed)
Cardiac Clearance faxed to Dr.Christopher End at this time. Surgery scheduled 11/06/2020. Patient scheduled for 10/25/2020 for surgical clearance-spoke with his son [name].

## 2020-10-24 NOTE — Progress Notes (Signed)
10/24/2020  Reason for Visit:  Right inguinal hernia  History of Present Illness: Lucas [name] Lucas [name] is a 65 y.o. male presenting for evaluation of a right inguinal hernia.  The patient presented to the ED on 10/16/20 with right sided abdominal pain, mostly in the right lower quadrant.  He had a CT scan of abdomen and pelvis which showed a right sided inguinal hernia containing fat and a portion of the bladder.  I have personally viewed the images and agree with the findings.  The patient reports that he has had an area of swelling in the right groin for many years.  He denies having any pain issues until recently when he went to the ER.  He has noticed however that at times he will feel some pressure in the right groin and also will feel some discomfort with urination, particularly when he has been holding his urine for a while.  Denies any nausea, vomiting, constipation or diarrhea.  Denies any issues with his umbilical area or with the left groin.  Of note, he has a history of appendectomy for perforated appendicitis and he also has a history of STEMI in 01/2017 requiring cardiac catheterization and LAD drug eluting stent.  He's currently on Plavix and Aspirin.  His most recent catheterization was in 2019 which showed an EF of 45%.  He denies any chest pain or shortness of breath, and is able to carry on with his daily activities and work.  Also, during CT workup in 2018, his CT scan also did show a right inguinal hernia.  Past Medical History: Past Medical History:  Diagnosis Date   Acute ST elevation myocardial infarction (STEMI) involving left anterior descending (LAD) coronary artery ([place])    a. 01/2017 Ant STEMI/PCI: LAD 20ost, 100p (3.0x23 [place]).   CAD (coronary artery disease)    a. 01/2017 Ant STEMI/PCI: LM 20d, LAD 20ost, 100p (3.0x23 [facility] [place] DES), 60m D1 50ost, LCX nl, RCA 30p; b. 11/2017 MV: Intermediate risk w/ 18minf ST dep; c. 11/2017 Cath: LM nl, LAD patent  prox stent, D1 20ost, LCX nl, RCA 30p. EF 45-50%.   Chronic combined systolic (congestive) and diastolic (congestive) heart failure ([place]   a. 01/2018 Echo: EF 30-35%, sev antsept, ant, apical HK. Gr1 DD, mild MR.   Essential hypertension    Hyperlipidemia    a. Myalgias with high dose lipitor - tolerating '40mg'$  daily.   Ischemic cardiomyopathy    a. 01/2018 Echo: EF 30-35%.   Type II diabetes mellitus ([place]     Past Surgical History: Past Surgical History:  Procedure Laterality Date   APPENDECTOMY     CARDIAC CATHETERIZATION     CORONARY ANGIOPLASTY     CORONARY/GRAFT ACUTE MI REVASCULARIZATION N/A 01/25/2017   Procedure: Coronary/Graft Acute MI Revascularization;  Surgeon: EnNelva BushMD;  [facility];  Service: Cardiovascular;  Laterality: N/A;   LEFT HEART CATH AND CORONARY ANGIOGRAPHY N/A 01/25/2017   Procedure: LEFT HEART CATH AND CORONARY ANGIOGRAPHY;  Surgeon: EnNelva BushMD;  [facility];  Service: Cardiovascular;  Laterality: N/A;   LEFT HEART CATH AND CORONARY ANGIOGRAPHY N/A 11/20/2017   Procedure: LEFT HEART CATH AND CORONARY ANGIOGRAPHY;  Surgeon: ArWellington HampshireMD;  [facility];  Service: Cardiovascular;  Laterality: N/A;    Home Medications: Prior to Admission medications   Medication Sig Start Date End Date Taking? Authorizing Provider  aspirin 81 MG chewable tablet TOMAR UNA TABLETA  POR BOCA DIARIO 08/09/20 08/09/21 Yes Darylene Price A, FNP  atorvastatin (LIPITOR) 10 MG tablet TOMAR UNA TABLETA POR BOCA DIARIO 08/09/20 08/09/21 Yes Hackney, Otila Kluver A, FNP  carvedilol (COREG) 12.5 MG tablet TOMAR UNA TABLETA POR BOCA DOS VECES AL DIA 08/09/20 08/09/21 Yes Darylene Price A, FNP  clopidogrel (PLAVIX) 75 MG tablet Take 1 tablet (75 mg total) by mouth daily. 08/09/20  Yes Hackney, Otila Kluver A, FNP  metFORMIN (GLUCOPHAGE) 500 MG tablet TOME UNA TABLETA TODOS LOS DIAS CON EL DESAYUNO 09/05/20  Yes Iloabachie, Chioma E, NP   sacubitril-valsartan (ENTRESTO) 24-26 MG Take 1 tablet by mouth 2 (two) times daily. 08/09/20  Yes Darylene Price A, FNP  spironolactone (ALDACTONE) 25 MG tablet TOMAR MEDIO TABLETA(0.5 TABLETA) POR BOCA AL DIARIO 08/09/20 07/25/21 Yes Hackney, Aura Fey, FNP  traMADol (ULTRAM) 50 MG tablet Take 1 tablet (50 mg total) by mouth every 6 (six) hours as needed. 10/16/20 10/16/21 Yes Lavonia Drafts, MD    Allergies: No Known Allergies  Social History:  reports that he has never smoked. He has never used smokeless tobacco. He reports that he does not drink alcohol and does not use drugs.   Family History: Family History  Problem Relation Age of Onset   Heart disease Father        s/p pacemaker   Arrhythmia Father    Asthma Father    Asthma Mother     Review of Systems: Review of Systems  Constitutional:  Negative for chills and fever.  HENT:  Negative for hearing loss.   Respiratory:  Negative for shortness of breath.   Cardiovascular:  Negative for chest pain.  Gastrointestinal:  Positive for abdominal pain. Negative for constipation, diarrhea, nausea and vomiting.  Genitourinary:        Feels pressure in right groin if he tries to hold his urine  Musculoskeletal:  Negative for myalgias.  Skin:  Negative for rash.  Neurological:  Negative for dizziness.  Psychiatric/Behavioral:  Negative for depression.    Physical Exam BP 136/78   Pulse 84   Temp 98.6 F (37 C) (Oral)   Ht 5' (1.524 m)   Wt 145 lb 12.8 oz (66.1 kg)   SpO2 96%   BMI 28.47 kg/m  CONSTITUTIONAL: No acute distress, well nourished. HEENT:  Normocephalic, atraumatic, extraocular motion intact. NECK: Trachea is midline, and there is no jugular venous distension.  RESPIRATORY:  Lungs are clear, and breath sounds are equal bilaterally. Normal respiratory effort without pathologic use of accessory muscles. CARDIOVASCULAR: Heart is regular without murmurs, gallops, or rubs. GI: The abdomen is soft, non-distended, non-tender  to palpation.  Patient has a well healed infraumbilical incision from his appendicitis.  He has a reducible right inguinal hernia with no evidence of strangulation.  No palpable hernia defect in the left groin or at the umbilicus.  MUSCULOSKELETAL:  Normal muscle strength and tone in all four extremities.  No peripheral edema or cyanosis. SKIN: Skin turgor is normal. There are no pathologic skin lesions.  NEUROLOGIC:  Motor and sensation is grossly normal.  Cranial nerves are grossly intact. PSYCH:  Alert and oriented to person, place and time. Affect is normal.  Laboratory Analysis: Labs from 10/16/20: Na 137, K 3.8, Cl 102, CO2 26, BUN 10, Cr 0.81.  LFTs within normal.  WBC 6.3, Hgb 16.1, Hct 45, Plt 190.  Imaging: CT scan abdomen/pelvis on 10/16/20: IMPRESSION: Right inguinal hernia now contains a portion of the bladder in addition to fat.   Appendix is  not identified. There are no inflammatory changes in this region.   Colonic diverticulosis.  Assessment and Plan: This is a 65 y.o. male with a reducible right inguinal hernia containing fat and portion of the bladder.  --Discussed with the patient that the hernia is reducible and there's no evidence on his CT scan of strangulation or incarceration.  He does have, however, a portion of his bladder that goes into the hernia.  I think this can explain the pressure in the right groin that he feels when his bladder is full.   --Discussed with him that unfortunately there are no conservative measures to repair a hernia without surgery.  Given that the hernia now contains bladder as well and he's having more symptoms, I recommend proceeding with surgical repair.  Discussed with him the plan for a robotic assisted right inguinal hernia repair.  During that time, we would be able to evaluate the left groin as well and if there's a hernia present, can repair at the same time.  Reviewed with him the surgery at length, including risks of bleeding,  infection, injury to surrounding structures, post-op recovery and restrictions, and he's willing to proceed.   --Given his prior STEMI and that he's currently on Plavix and Aspirin, will also obtain cardiology clearance.  Ideally would like to be able to stop both medications prior to surgery.  Discussed with the patient 7 days for Plavix, and 5 days for Aspirin, but will defer to cardiology for further recommendations. --Will tentatively schedule him for 11/06/20, pending cardiology clearance.  Face-to-face time spent with the patient and care providers was 60 minutes, with more than 50% of the time spent counseling, educating, and coordinating care of the patient.     Melvyn Neth, [facility]

## 2020-10-24 NOTE — Telephone Encounter (Signed)
Faxed Cardiac Clearance to Darylene Price, [place] at [phone].

## 2020-10-24 NOTE — Patient Instructions (Signed)
Our surgery scheduler Pamala Hurry will call you within 24-48 hours to get you scheduled. If you have not heard from her after 48 hours, please call our office. You will not need to get Covid tested before surgery and have the blue sheet available when she calls to write down important information.  Take your LAST dose of PLAVIX on August 29th and take your LAST dose of ASPIRIN on August 31st.  If you have any concerns or questions, please feel free to call our office.  Hernia inguinal en los adultos Inguinal Hernia, Adult Una hernia inguinal se produce cuando la grasa o los intestinos empujan a travs de una zona dbil de un msculo donde se unen la pierna y el abdomen inferior (ingle). Esto produce un bulto. Este tipo de hernia tambin puede aparecer en: En el escroto, si es varn. En los pliegues de la piel alrededor de la vagina, si es [place]. Existen tres tipos de hernias inguinales: Hernias que se pueden empujar hacia adentro del abdomen (son reducibles). Este tipo de hernias rara vez provoca dolor. Hernias que no se pueden empujar hacia adentro del abdomen (encarceladas). Hernias que no se pueden empujar hacia adentro del abdomen y pierden la irrigacin sangunea (estranguladas). Este tipo de hernia requiere ciruga de Freight forwarder. Cules son las causas? Esta afeccin se produce cuando tiene una zona dbil en los msculos o en los tejidos de la ingle. Esto se desarrolla con el tiempo. La hernia puede salirse por la zona dbil cuando ejerce un esfuerzo [employer] de la parte baja del vientre de [place] repentina, por ejemplo, cuando usted: Levanta un objeto pesado. Hace esfuerzos para mover el vientre (defecar). La dificultad para mover el vientre (estreimiento) puede derivar en un esfuerzo. Tos. Qu incrementa el riesgo? Es ms probable que Orthoptist en: Los hombres. [place]. Personas que: Tienen sobrepeso. Realizan trabajos que requieren Haematologist  de pie durante largos perodos o levantar cargas pesadas. Han tenido una hernia inguinal previamente. Fuman o tienen una enfermedad pulmonar. Estos factores pueden causar tos a largo plazo (crnica). Cules son los signos o sntomas? Los sntomas pueden depender del tamao de la hernia. Con frecuencia, una pequea hernia no tiene sntomas. Estos son algunos de los sntomas de una hernia ms grande: Un bulto en la zona de la ingle. Este es fcil de ver cuando se encuentra de pie. Es posible que no pueda verlo cuando est recostado. Dolor o ardor en la ingle. Esto podra empeorar cuando levanta un objeto, realiza un esfuerzo o tose. Un dolor sordo o una sensacin de presin en la ingle. Un bulto anormal en el escroto, en los hombres. Los sntomas de una hernia inguinal estrangulada pueden incluir lo siguiente: Un bulto en la ingle que es muy doloroso y sensible al tacto. Un bulto que se torna de color rojo o prpura. Fiebre, sensacin de que va a vomitar (nuseas) y vmitos. No poder mover el vientre ni expulsar gases. Cmo se trata? El tratamiento depende del tamao de la hernia y de si tiene sntomas o no. Si no tiene sntomas, el mdico podr indicarle que controle cuidadosamente la hernia y que concurra a las visitas de seguimiento. Si tiene sntomas o [place] grande, es posible que necesite ciruga para repararla. Siga estas instrucciones en su casa: Estilo de vida Evite levantar objetos pesados. Evite estar de pie durante mucho tiempo. No fume ni consuma ningn producto que contenga nicotina o tabaco. Si necesita ayuda para dejar de fumar, consulte al  mdico. Mantenga un peso saludable. Evite la dificultad para defecar Es posible que deba tomar estas medidas para prevenir o tratar los problemas para defecar: Electronics engineer suficiente lquido para Contractor pis (orina) de color amarillo plido. Usar medicamentos recetados o de Radio broadcast assistant. [place] alimentos ricos en fibra. Entre ellos,  frijoles, cereales integrales y frutas y verduras frescas. Limitar los alimentos con alto contenido de grasa y Location manager. Estos incluyen alimentos fritos o dulces. Instrucciones generales Puede intentar empujar la hernia hacia adentro presionando muy suavemente sobre esta cuando est acostado. No intente empujar el bulto hacia adentro si este no entra fcilmente. Observe si la hernia cambia de forma, de color o de tamao. Informe al mdico si observa algn cambio. Use los medicamentos de venta libre y los recetados solamente como se lo haya indicado el mdico. Cumpla con todas las visitas de seguimiento. Comunquese con un mdico si: Tiene fiebre o escalofros. Aparecen nuevos sntomas. Sus sntomas empeoran. Solicite ayuda de inmediato si: Tiene dolor en la ingle que empeora de repente. Tiene un bulto en la ingle que tiene las siguientes caractersticas: Se agranda de repente y no se encoge despus. Se pone rojo o morado. Le duele al tocarlo. Usted es hombre y tiene: Dolor repentino en el escroto. Un cambio repentino en el tamao del escroto. No puede volver a Public affairs consultant hernia en su lugar al ejercer sobre esta una presin muy suave mientras est acostado. Siente ganas de vomitar y la sensacin no desaparece. Sigue vomitando. Tiene latidos cardacos acelerados. No puede mover el vientre o expulsar gases. Estos sntomas pueden Sales executive. Solicite ayuda de inmediato. Comunquese con el servicio de emergencias de su localidad (911 en los [place]). No espere a ver si los sntomas desaparecen. No conduzca por sus propios medios [employer] hospital. Resumen Una hernia inguinal se produce cuando la grasa o los intestinos empujan a travs de una zona dbil de un msculo donde se unen la pierna y el abdomen inferior (ingle). Esto produce un bulto. Si no tiene sntomas, es posible que no necesite tratamiento. Si tiene sntomas o una hernia grande, es posible que necesite  [place]. Evite levantar objetos pesados. Tambin evite estar de pie durante mucho tiempo. No intente empujar el bulto hacia adentro si este no entra fcilmente. Esta informacin no tiene Marine scientist el consejo del mdico. Asegresede hacerle al mdico cualquier pregunta que tenga. Document Revised: 12/19/2019 Document Reviewed: 11/04/2019 Elsevier Patient Education  2022 [employer].

## 2020-10-24 NOTE — Telephone Encounter (Signed)
   Name: Rodin Pullium  DOB: 1955/11/14  MRN: [id number]  Primary Cardiologist: Nelva Bush, MD  Chart reviewed as part of pre-operative protocol coverage. Because of Griffen Palestino Rojas's past medical history and time since last visit, he will require a follow-up visit in order to better assess preoperative cardiovascular risk.  Pre-op covering staff: - Please add "pre-op clearance" to the appointment for tomorrow 10/25/20 with Christell Faith, PA-C so the provider is aware.  If applicable, this message will also be routed to pharmacy pool and/or primary cardiologist for input on holding anticoagulant/antiplatelet agent as requested below so that this information is available to the clearing provider at time of patient's appointment.   Kathyrn Drown, NP  10/24/2020, 2:24 PM

## 2020-10-24 NOTE — Progress Notes (Signed)
Cardiology Office Note    Date:  10/25/2020   ID:  Edell, Lucas [name] 11, 1957, MRN [id number]  PCP:  [name], MD  Cardiologist:  Nelva Bush, MD  Electrophysiologist:  None   Chief Complaint: Preoperative cardiac risk stratification  History of Present Illness:   Lucas [name] is a 65 y.o. male with history of CAD with late presenting anterior STEMI in 01/2017 status post PCI to the proximal LAD, chronic combined systolic and diastolic CHF, ICM, borderline diabetes, HTN, and HLD who presents for preoperative cardiac risk stratification.  He was admitted to the hospital in 01/2017 with a late presenting anterior STEMI.  He underwent successful PCI/DES to the proximal LAD.  Otherwise, there was mild to moderate nonobstructive disease involving the mid to distal LAD and RCA.  Echo at that time showed an EF of 30 to 35% with severe hypokinesis of the anteroseptal, anterior, and apical myocardium.  There was grade 1 diastolic dysfunction and mild mitral regurgitation.  With medical therapy and PCI, repeat echo in 05/2017 showed an improvement in his LV systolic function with an EF of 40 to 45%, hypokinesis of the anteroseptal and apical myocardium, grade 1 diastolic dysfunction, mild mitral regurgitation, normal RV systolic function, and normal PASP.  He underwent repeat LHC in 11/2017 in the context of an abnormal ETT which demonstrated widely patent LAD stent with no significant restenosis.  There was mild proximal RCA disease that was not significantly different from cardiac cath in 01/2017.  Mildly reduced LV systolic function with an EF of 45% with mid to distal anterior wall hypokinesis.  Medical therapy was recommended for underlying CAD and ICM.  We last saw him in 07/2019, at which time he was doing well from a cardiac perspective.  Since then, he has followed with the [facility].  He was evaluated in the [facility] ED on [id number] with periumbilical  discomfort.  Imaging demonstrated a right inguinal hernia that contained a portion of the bladder in addition to fat.  He was evaluated by surgery on 10/24/2020 with recommendation to undergo robotic assisted inguinal hernia repair, which is currently scheduled for 11/06/2020.  He comes in doing very well from a cardiac perspective.  No chest pain, dyspnea, palpitations, dizziness, presyncope, syncope, lower extremity swelling, abdominal distention, orthopnea, PND, or early satiety.  No falls, hematochezia, or melena.  He is tolerating all cardiac medications without issues.  He continues to have an active lifestyle without cardiac limitation.  He does not have any issues or concerns at this time.   Revised Cardiac Risk Index: moderate risk for noncardiac surgery Duke Activity Status Index: > 4 METs   Labs independently reviewed:  10/2020 - Hgb 16.1, PLT 190, potassium 3.8, BUN 10, serum creatinine 0.81, albumin 4.0, AST/ALT normal 11/2019 - A1c 6.2, TC 145, TG 215, HDL 30, LDL 79  Past Medical History:  Diagnosis Date   Acute ST elevation myocardial infarction (STEMI) involving left anterior descending (LAD) coronary artery ([place])    a. 01/2017 Ant STEMI/PCI: LAD 20ost, 100p (3.0x23 Xience Sierra DES).   CAD (coronary artery disease)    a. 01/2017 Ant STEMI/PCI: LM 20d, LAD 20ost, 100p (3.0x23 Xience Sierra DES), 69m D1 50ost, LCX nl, RCA 30p; b. 11/2017 MV: Intermediate risk w/ 173minf ST dep; c. 11/2017 Cath: LM nl, LAD patent prox stent, D1 20ost, LCX nl, RCA 30p. EF 45-50%.   Chronic combined systolic (congestive) and diastolic (congestive) heart failure (HCC)  a. 01/2018 Echo: EF 30-35%, sev antsept, ant, apical HK. Gr1 DD, mild MR.   Essential hypertension    Hyperlipidemia    a. Myalgias with high dose lipitor - tolerating '40mg'$  daily.   Ischemic cardiomyopathy    a. 01/2018 Echo: EF 30-35%.   Type II diabetes mellitus ([place])     Past Surgical History:  Procedure Laterality Date    APPENDECTOMY     CARDIAC CATHETERIZATION     CORONARY ANGIOPLASTY     CORONARY/GRAFT ACUTE MI REVASCULARIZATION N/A 01/25/2017   Procedure: Coronary/Graft Acute MI Revascularization;  Surgeon: Nelva Bush, MD;  Location: [facility];  Service: Cardiovascular;  Laterality: N/A;   LEFT HEART CATH AND CORONARY ANGIOGRAPHY N/A 01/25/2017   Procedure: LEFT HEART CATH AND CORONARY ANGIOGRAPHY;  Surgeon: Nelva Bush, MD;  Location: [facility];  Service: Cardiovascular;  Laterality: N/A;   LEFT HEART CATH AND CORONARY ANGIOGRAPHY N/A 11/20/2017   Procedure: LEFT HEART CATH AND CORONARY ANGIOGRAPHY;  Surgeon: Wellington Hampshire, MD;  Location: [facility];  Service: Cardiovascular;  Laterality: N/A;    Current Medications: Current Meds  Medication Sig   aspirin 81 MG chewable tablet TOMAR UNA TABLETA POR BOCA DIARIO   atorvastatin (LIPITOR) 10 MG tablet TOMAR UNA TABLETA POR BOCA DIARIO   carvedilol (COREG) 12.5 MG tablet TOMAR UNA TABLETA POR BOCA DOS VECES AL DIA   clopidogrel (PLAVIX) 75 MG tablet Take 1 tablet (75 mg total) by mouth daily.   metFORMIN (GLUCOPHAGE) 500 MG tablet TOME UNA TABLETA TODOS LOS DIAS CON EL DESAYUNO   sacubitril-valsartan (ENTRESTO) 24-26 MG Take 1 tablet by mouth 2 (two) times daily.   spironolactone (ALDACTONE) 25 MG tablet TOMAR MEDIO TABLETA(0.5 TABLETA) POR BOCA AL DIARIO   traMADol (ULTRAM) 50 MG tablet Take 1 tablet (50 mg total) by mouth every 6 (six) hours as needed.    Allergies:   Patient has no known allergies.   Social History   Socioeconomic History   Marital status: Married    Spouse name: Not on file   Number of children: Not on file   Years of education: Not on file   Highest education level: Not on file  Occupational History   Occupation: unemployed  Tobacco Use   Smoking status: Never   Smokeless tobacco: Never  Vaping Use   Vaping Use: Never used  Substance and Sexual Activity   Alcohol use: No    Drug use: No   Sexual activity: Yes    Birth control/protection: Coitus interruptus, None  Other Topics Concern   Not on file  Social History Narrative   Not on file   Social Determinants of Health   Financial Resource Strain: Medium Risk   Difficulty of Paying Living Expenses: Somewhat hard  Food Insecurity: No Food Insecurity   Worried About Charity fundraiser in the Last Year: Never true   Arboriculturist in the Last Year: Never true  Transportation Needs: Unmet Transportation Needs   Lack of Transportation (Medical): Yes   Lack of Transportation (Non-Medical): Yes  Physical Activity: Insufficiently Active   Days of Exercise per Week: 3 days   Minutes of Exercise per Session: 30 min  Stress: No Stress Concern Present   Feeling of Stress : Not at all  Social Connections: Moderately Isolated   Frequency of Communication with Friends and Family: More than three times a week   Frequency of Social Gatherings with Friends and Family: More than three  times a week   Attends Religious Services: More than 4 times per year   Active Member of Clubs or Organizations: No   Attends Archivist Meetings: Never   Marital Status: Separated     Family History:  The patient's family history includes Arrhythmia in his father; Asthma in his father and mother; Heart disease in his father.  ROS:   Review of Systems  Constitutional:  Negative for chills, diaphoresis, fever, malaise/fatigue and weight loss.  HENT:  Negative for congestion.   Eyes:  Negative for discharge and redness.  Respiratory:  Negative for cough, hemoptysis, sputum production, shortness of breath and wheezing.   Cardiovascular:  Negative for chest pain, palpitations, orthopnea, claudication, leg swelling and PND.  Gastrointestinal:  Positive for abdominal pain. Negative for blood in stool, heartburn, melena, nausea and vomiting.  Genitourinary:  Negative for hematuria.  Musculoskeletal:  Negative for falls and  myalgias.  Skin:  Negative for rash.  Neurological:  Negative for dizziness, tingling, tremors, sensory change, speech change, focal weakness, loss of consciousness and weakness.  Endo/Heme/Allergies:  Does not bruise/bleed easily.  Psychiatric/Behavioral:  Negative for substance abuse. The patient is not nervous/anxious.   All other systems reviewed and are negative.   EKGs/Labs/Other Studies Reviewed:    Studies reviewed were summarized above. The additional studies were reviewed today:  LHC 01/25/2017: Conclusions: Late-presenting anterior STEMI with acute plaque rupture and 100% thrombotic occlusion of the proximal LAD (type 1 MI). Mild to moderate, non-obstructive disease involving the mid/distal LAD and RCA. Moderately to severely reduced left ventricular contraction. Moderately elevated left ventricular filling pressure.   Recommendations: Admit to ICU; patient will need to remain hospitalized at least 48 hours, given large anterior MI. Dual antiplatelet therapy with aspirin and ticagrelor for at least 12 months, ideally longer. Aggressive secondary prevention, including high-intensity statin therapy. Obtain transthoracic echocardiogram to better assess LVEF. If LVEF < 35%, I recommend LifeVest on discharge given large anterior MI. Gentle diuresis, given elevated LVEDP. Initiate carvedilol 3.125 mg BID with uptitration as tolerated. ACEI/ARB +/- aldosterone antagonist should be added prior to discharge based on renal function. __________  2D echo 01/26/2017: - Left ventricle: The cavity size was normal. There was moderate    concentric hypertrophy. Systolic function was moderately to    severely reduced. The estimated ejection fraction was in the    range of 30% to 35%. There is severe hypokinesis of the    anteroseptal, anterior, and apical myocardium. Doppler parameters    are consistent with abnormal left ventricular relaxation (grade 1    diastolic dysfunction).  -  Mitral valve: There was mild regurgitation. __________  2D echo 05/29/2017: - Left ventricle: The cavity size was normal. Systolic function was    mildly to moderately reduced. The estimated ejection fraction was    in the range of 40% to 45%. Hypokinesis of the anterior    myocardium. Hypokinesis of the anteroseptal myocardium.    Hypokinesis of the apical myocardium. Doppler parameters are    consistent with abnormal left ventricular relaxation (grade 1    diastolic dysfunction).  - Mitral valve: There was mild regurgitation.  - Left atrium: The atrium was normal in size.  - Right ventricle: Systolic function was normal.  - Pulmonary arteries: Systolic pressure was within the normal    range. __________  ETT 11/06/2017: Baseline EKG demonstrates normal sinus rhythm with anteroseptal Q waves and non-specific T wave abnormality. Patient demonstrates good exercise capacity with normal heart rate and  blood pressure responses. Neck and jaw discomfort noted during peak stress and into early recovery. Horizontal ST segment depression ST segment depression of 1 mm was noted during stress in the II, III and aVF leads. No significant arrhythmia was observed. Intermediate risk study (Duke Treadmill Score = 0).   Abnormal, intermediate risk exercise tolerance test with mild ST depression during stress and neck/jaw pain, concerning for anginal equivalent. __________  [facility] 11/20/2017: Previously placed Prox LAD drug eluting stent is widely patent. Balloon angioplasty was performed. Ost 1st Diag lesion is 20% stenosed. Prox RCA lesion is 30% stenosed. There is mild left ventricular systolic dysfunction. LV end diastolic pressure is mildly elevated. The left ventricular ejection fraction is 45-50% by visual estimate.   1.  Widely patent LAD stent with no significant restenosis.  Mild proximal RCA disease not different from cardiac catheterization last year. 2.  Mildly reduced LV systolic function  with an EF of 45% with mid to distal anterior wall hypokinesis.  Mildly elevated left ventricular end-diastolic pressure.   Recommendations: Continue medical therapy for coronary artery disease and ischemic cardiomyopathy. The patient can be discharged home from a cardiac standpoint.    EKG:  EKG is ordered today.  The EKG ordered today demonstrates NSR with sinus arrhythmia, 71 bpm, poor R wave progression along the precordial leads, prior septal MI, no acute ST-T changes  Recent Labs: 10/16/2020: ALT 17; BUN 10; Creatinine, Ser 0.81; Hemoglobin 16.1; Platelets 190; Potassium 3.8; Sodium 137  Recent Lipid Panel    Component Value Date/Time   CHOL 145 11/02/2019 1007   TRIG 215 (H) 11/02/2019 1007   HDL 30 (L) 11/02/2019 1007   CHOLHDL 4.8 11/02/2019 1007   CHOLHDL 2.8 05/11/2017 1526   VLDL 23 05/11/2017 1526   LDLCALC 79 11/02/2019 1007   LDLDIRECT 33 02/16/2018 0807    PHYSICAL EXAM:    VS:  BP 122/74 (BP Location: Left Arm, Patient Position: Sitting, Cuff Size: Normal)   Pulse 71   Ht 5' (1.524 m)   Wt 144 lb (65.3 kg)   SpO2 98%   BMI 28.12 kg/m   BMI: Body mass index is 28.12 kg/m.  Physical Exam Vitals reviewed.  Constitutional:      Appearance: He is well-developed.  HENT:     Head: Normocephalic and atraumatic.  Eyes:     General:        Right eye: No discharge.        Left eye: No discharge.  Neck:     Vascular: No JVD.  Cardiovascular:     Rate and Rhythm: Normal rate and regular rhythm.     Pulses:          Posterior tibial pulses are 2+ on the right side and 2+ on the left side.     Heart sounds: Normal heart sounds, S1 normal and S2 normal. Heart sounds not distant. No midsystolic click and no opening snap. No murmur heard.   No friction rub.  Pulmonary:     Effort: Pulmonary effort is normal. No respiratory distress.     Breath sounds: Normal breath sounds. No decreased breath sounds, wheezing or rales.  Chest:     Chest wall: No tenderness.   Abdominal:     General: There is no distension.     Palpations: Abdomen is soft.     Tenderness: There is no abdominal tenderness.  Musculoskeletal:     Cervical back: Normal range of motion.  Skin:    General: Skin  is warm and dry.     Nails: There is no clubbing.  Neurological:     Mental Status: He is alert and oriented to person, place, and time.  Psychiatric:        Speech: Speech normal.        Behavior: Behavior normal.        Thought Content: Thought content normal.        Judgment: Judgment normal.    Wt Readings from Last 3 Encounters:  10/25/20 144 lb (65.3 kg)  10/24/20 145 lb 12.8 oz (66.1 kg)  10/16/20 143 lb 4.8 oz (65 kg)     ASSESSMENT & PLAN:   Preoperative cardiac risk stratification: The patient is scheduled to undergo robotic assisted inguinal hernia repair on 11/06/2017.  Per Revised Cardiac Risk Index, he is moderate risk for noncardiac surgery.  He is able to achieve > 4 METs per Duke Activity Status Index without cardiac limitation.  He may proceed with noncardiac surgery at an overall moderate risk, given his PMH, without further cardiac testing.  He has been advised to stop clopidogrel beginning on 9/1 (last dose on 8/31) to allow for a 5-day washout.  Recommend resuming of clopidogrel when safely possible from a bleeding perspective per surgical team.  He will remain on aspirin 81 mg daily throughout the perioperative timeframe.  CAD involving the native coronary arteries without angina: He is doing well without symptoms concerning for angina.  Clopidogrel therapy will be interrupted in the perioperative timeframe as outlined above.  Otherwise, he will remain on aspirin, carvedilol, and atorvastatin.  Aggressive risk factor modification.  No indication for ischemic testing at this time.  Chronic combined systolic and diastolic CHF/ICM: He appears euvolemic and well compensated with NYHA class I symptoms.  Most recent echo from 05/2017 showed an improvement in  his LV systolic function with an EF of 45%.  Continue current GDMT including carvedilol, Entresto, and spironolactone.  When he is seen in follow-up in several months time, recommend obtaining echo to evaluate his LV systolic function.  If his cardiomyopathy persists, consider addition of SGLT2i.  This echo does not need to delay his needed surgery.  CHF education.  HTN: Blood pressure is well controlled in the office today.  Continue current medical therapy as outlined above.  HLD: LDL 79 in 11/2019 with goal being less than 70.  He remains on atorvastatin 10 mg.  In follow-up, recommend repeating fasting lipid panel and LFT with recommendation to titrate atorvastatin accordingly to achieve target LDL less than 70.  Language barrier: [place] medical interpreter was utilized for today's visit.   Disposition: F/u with Dr. Saunders Revel or an APP in 3 months.   Medication Adjustments/Labs and Tests Ordered: Current medicines are reviewed at length with the patient today.  Concerns regarding medicines are outlined above. Medication changes, Labs and Tests ordered today are summarized above and listed in the Patient Instructions accessible in Encounters.   Signed, Christell Faith, PA-C 10/25/2020 12:23 PM     [address], [place] [address] [phone]

## 2020-10-24 NOTE — H&P (View-Only) (Signed)
10/24/2020  Reason for Visit:  Right inguinal hernia  History of Present Illness: Lucas [name] Lucas [name] is a 65 y.o. male presenting for evaluation of a right inguinal hernia.  The patient presented to the ED on 10/16/20 with right sided abdominal pain, mostly in the right lower quadrant.  He had a CT scan of abdomen and pelvis which showed a right sided inguinal hernia containing fat and a portion of the bladder.  I have personally viewed the images and agree with the findings.  The patient reports that he has had an area of swelling in the right groin for many years.  He denies having any pain issues until recently when he went to the ER.  He has noticed however that at times he will feel some pressure in the right groin and also will feel some discomfort with urination, particularly when he has been holding his urine for a while.  Denies any nausea, vomiting, constipation or diarrhea.  Denies any issues with his umbilical area or with the left groin.  Of note, he has a history of appendectomy for perforated appendicitis and he also has a history of STEMI in 01/2017 requiring cardiac catheterization and LAD drug eluting stent.  He's currently on Plavix and Aspirin.  His most recent catheterization was in 2019 which showed an EF of 45%.  He denies any chest pain or shortness of breath, and is able to carry on with his daily activities and work.  Also, during CT workup in 2018, his CT scan also did show a right inguinal hernia.  Past Medical History: Past Medical History:  Diagnosis Date   Acute ST elevation myocardial infarction (STEMI) involving left anterior descending (LAD) coronary artery ([place])    a. 01/2017 Ant STEMI/PCI: LAD 20ost, 100p (3.0x23 Xience [place]).   CAD (coronary artery disease)    a. 01/2017 Ant STEMI/PCI: LM 20d, LAD 20ost, 100p (3.0x23 Xience [place] DES), 53m D1 50ost, LCX nl, RCA 30p; b. 11/2017 MV: Intermediate risk w/ 189minf ST dep; c. 11/2017 Cath: LM nl, LAD patent  prox stent, D1 20ost, LCX nl, RCA 30p. EF 45-50%.   Chronic combined systolic (congestive) and diastolic (congestive) heart failure [place]   a. 01/2018 Echo: EF 30-35%, sev antsept, ant, apical HK. Gr1 DD, mild MR.   Essential hypertension    Hyperlipidemia    a. Myalgias with high dose lipitor - tolerating '40mg'$  daily.   Ischemic cardiomyopathy    a. 01/2018 Echo: EF 30-35%.   Type II diabetes mellitus ([place]     Past Surgical History: Past Surgical History:  Procedure Laterality Date   APPENDECTOMY     CARDIAC CATHETERIZATION     CORONARY ANGIOPLASTY     CORONARY/GRAFT ACUTE MI REVASCULARIZATION N/A 01/25/2017   Procedure: Coronary/Graft Acute MI Revascularization;  Surgeon: EnNelva BushMD;  [facility];  Service: Cardiovascular;  Laterality: N/A;   LEFT HEART CATH AND CORONARY ANGIOGRAPHY N/A 01/25/2017   Procedure: LEFT HEART CATH AND CORONARY ANGIOGRAPHY;  Surgeon: EnNelva BushMD;  [place] [facility];  Service: Cardiovascular;  Laterality: N/A;   LEFT HEART CATH AND CORONARY ANGIOGRAPHY N/A 11/20/2017   Procedure: LEFT HEART CATH AND CORONARY ANGIOGRAPHY;  Surgeon: ArWellington HampshireMD;  [place] [facility];  Service: Cardiovascular;  Laterality: N/A;    Home Medications: Prior to Admission medications   Medication Sig Start Date End Date Taking? Authorizing Provider  aspirin 81 MG chewable tablet TOMAR UNA TABLETA  POR BOCA DIARIO 08/09/20 08/09/21 Yes Darylene Price A, FNP  atorvastatin (LIPITOR) 10 MG tablet TOMAR UNA TABLETA POR BOCA DIARIO 08/09/20 08/09/21 Yes Hackney, Otila Kluver A, FNP  carvedilol (COREG) 12.5 MG tablet TOMAR UNA TABLETA POR BOCA DOS VECES AL DIA 08/09/20 08/09/21 Yes Darylene Price A, FNP  clopidogrel (PLAVIX) 75 MG tablet Take 1 tablet (75 mg total) by mouth daily. 08/09/20  Yes Hackney, Otila Kluver A, FNP  metFORMIN (GLUCOPHAGE) 500 MG tablet TOME UNA TABLETA TODOS LOS DIAS CON EL DESAYUNO 09/05/20  Yes Iloabachie, Chioma E, NP   sacubitril-valsartan (ENTRESTO) 24-26 MG Take 1 tablet by mouth 2 (two) times daily. 08/09/20  Yes Darylene Price A, FNP  spironolactone (ALDACTONE) 25 MG tablet TOMAR MEDIO TABLETA(0.5 TABLETA) POR BOCA AL DIARIO 08/09/20 07/25/21 Yes Hackney, Aura Fey, FNP  traMADol (ULTRAM) 50 MG tablet Take 1 tablet (50 mg total) by mouth every 6 (six) hours as needed. 10/16/20 10/16/21 Yes Lavonia Drafts, MD    Allergies: No Known Allergies  Social History:  reports that he has never smoked. He has never used smokeless tobacco. He reports that he does not drink alcohol and does not use drugs.   Family History: Family History  Problem Relation Age of Onset   Heart disease Father        s/p pacemaker   Arrhythmia Father    Asthma Father    Asthma Mother     Review of Systems: Review of Systems  Constitutional:  Negative for chills and fever.  HENT:  Negative for hearing loss.   Respiratory:  Negative for shortness of breath.   Cardiovascular:  Negative for chest pain.  Gastrointestinal:  Positive for abdominal pain. Negative for constipation, diarrhea, nausea and vomiting.  Genitourinary:        Feels pressure in right groin if he tries to hold his urine  Musculoskeletal:  Negative for myalgias.  Skin:  Negative for rash.  Neurological:  Negative for dizziness.  Psychiatric/Behavioral:  Negative for depression.    Physical Exam BP 136/78   Pulse 84   Temp 98.6 F (37 C) (Oral)   Ht 5' (1.524 m)   Wt 145 lb 12.8 oz (66.1 kg)   SpO2 96%   BMI 28.47 kg/m  CONSTITUTIONAL: No acute distress, well nourished. HEENT:  Normocephalic, atraumatic, extraocular motion intact. NECK: Trachea is midline, and there is no jugular venous distension.  RESPIRATORY:  Lungs are clear, and breath sounds are equal bilaterally. Normal respiratory effort without pathologic use of accessory muscles. CARDIOVASCULAR: Heart is regular without murmurs, gallops, or rubs. GI: The abdomen is soft, non-distended, non-tender  to palpation.  Patient has a well healed infraumbilical incision from his appendicitis.  He has a reducible right inguinal hernia with no evidence of strangulation.  No palpable hernia defect in the left groin or at the umbilicus.  MUSCULOSKELETAL:  Normal muscle strength and tone in all four extremities.  No peripheral edema or cyanosis. SKIN: Skin turgor is normal. There are no pathologic skin lesions.  NEUROLOGIC:  Motor and sensation is grossly normal.  Cranial nerves are grossly intact. PSYCH:  Alert and oriented to person, place and time. Affect is normal.  Laboratory Analysis: Labs from 10/16/20: Na 137, K 3.8, Cl 102, CO2 26, BUN 10, Cr 0.81.  LFTs within normal.  WBC 6.3, Hgb 16.1, Hct 45, Plt 190.  Imaging: CT scan abdomen/pelvis on 10/16/20: IMPRESSION: Right inguinal hernia now contains a portion of the bladder in addition to fat.   Appendix is  not identified. There are no inflammatory changes in this region.   Colonic diverticulosis.  Assessment and Plan: This is a 65 y.o. male with a reducible right inguinal hernia containing fat and portion of the bladder.  --Discussed with the patient that the hernia is reducible and there's no evidence on his CT scan of strangulation or incarceration.  He does have, however, a portion of his bladder that goes into the hernia.  I think this can explain the pressure in the right groin that he feels when his bladder is full.   --Discussed with him that unfortunately there are no conservative measures to repair a hernia without surgery.  Given that the hernia now contains bladder as well and he's having more symptoms, I recommend proceeding with surgical repair.  Discussed with him the plan for a robotic assisted right inguinal hernia repair.  During that time, we would be able to evaluate the left groin as well and if there's a hernia present, can repair at the same time.  Reviewed with him the surgery at length, including risks of bleeding,  infection, injury to surrounding structures, post-op recovery and restrictions, and he's willing to proceed.   --Given his prior STEMI and that he's currently on Plavix and Aspirin, will also obtain cardiology clearance.  Ideally would like to be able to stop both medications prior to surgery.  Discussed with the patient 7 days for Plavix, and 5 days for Aspirin, but will defer to cardiology for further recommendations. --Will tentatively schedule him for 11/06/20, pending cardiology clearance.  Face-to-face time spent with the patient and care providers was 60 minutes, with more than 50% of the time spent counseling, educating, and coordinating care of the patient.     Melvyn Neth, [facility]

## 2020-10-25 ENCOUNTER — Ambulatory Visit (INDEPENDENT_AMBULATORY_CARE_PROVIDER_SITE_OTHER): Payer: Self-pay | Admitting: Physician Assistant

## 2020-10-25 ENCOUNTER — Encounter: Payer: Self-pay | Admitting: Physician Assistant

## 2020-10-25 ENCOUNTER — Telehealth: Payer: Self-pay | Admitting: Surgery

## 2020-10-25 VITALS — BP 122/74 | HR 71 | Ht 60.0 in | Wt 144.0 lb

## 2020-10-25 DIAGNOSIS — I2510 Atherosclerotic heart disease of native coronary artery without angina pectoris: Secondary | ICD-10-CM

## 2020-10-25 DIAGNOSIS — I5042 Chronic combined systolic (congestive) and diastolic (congestive) heart failure: Secondary | ICD-10-CM

## 2020-10-25 DIAGNOSIS — I10 Essential (primary) hypertension: Secondary | ICD-10-CM

## 2020-10-25 DIAGNOSIS — Z01810 Encounter for preprocedural cardiovascular examination: Secondary | ICD-10-CM

## 2020-10-25 DIAGNOSIS — E785 Hyperlipidemia, unspecified: Secondary | ICD-10-CM

## 2020-10-25 NOTE — Patient Instructions (Signed)
Medication Instructions:   Please STOP Plavix 5 days prior to your surgery (11/01/2020) Resume per surgeons orders  Continue 81 mg aspirin   *If you need a refill on your cardiac medications before your next appointment, please call your pharmacy*  Lab Work: None  Testing/Procedures: None  Follow-Up: At [facility], you and your health needs are our priority.  As part of our continuing mission to provide you with exceptional heart care, we have created designated Provider Care Teams.  These Care Teams include your primary Cardiologist (physician) and Advanced Practice Providers (APPs -  Physician Assistants and Nurse Practitioners) who all work together to provide you with the care you need, when you need it.  We recommend signing up for the patient portal called "MyChart".  Sign up information is provided on this After Visit Summary.  MyChart is used to connect with patients for Virtual Visits (Telemedicine).  Patients are able to view lab/test results, encounter notes, upcoming appointments, etc.  Non-urgent messages can be sent to your provider as well.   To learn more about what you can do with MyChart, go to [email or web address].    Your next appointment:   3 month(s)  The format for your next appointment:   In Person  Provider:   Christell Faith, PA-C

## 2020-10-25 NOTE — Telephone Encounter (Signed)
Outgoing call is made, left message for patient to call.  Please inform patient of Pre-Admission date/time, COVID Testing date and Surgery date.  Surgery Date: 11/06/20 Preadmission Testing Date: 10/30/20 (phone 1p-5p) Covid Testing Date: Not needed.     Also patient will need to call at [phone], between 1-3:00pm the day before surgery, to find out what time to arrive for surgery.    Also per Dr. Hampton Abbot patient will need to take his last dose of Plavis on 10/29/20, aspirin on 10/31/20.

## 2020-10-26 ENCOUNTER — Telehealth: Payer: Self-pay | Admitting: Surgery

## 2020-10-26 NOTE — Telephone Encounter (Signed)
Patient scheduled for surgery with Dr. Hampton Abbot on 11/06/20.  He is needing a work note to give to his employer stating that he will be having surgery on 11/06/20 and the anticipated length of time out of work.  Please call his son, Lucas [name] at [phone] when ready. Thank you.

## 2020-10-26 NOTE — Telephone Encounter (Signed)
Incoming call from son, Deno Lunger.  They all have now been made aware of dates and information is given regarding his surgery on 11/06/20.  They are also reminded per Dr Hampton Abbot that last dose of Plavix to be on 8/29 and aspirin 10/31/20.  They verbalized understanding.    Also per their request since patient works 2nd shift, pre -admit is now on 10/30/20 between 8:00 am to 1:00.

## 2020-10-30 ENCOUNTER — Other Ambulatory Visit: Payer: Self-pay

## 2020-10-30 ENCOUNTER — Other Ambulatory Visit
Admission: RE | Admit: 2020-10-30 | Discharge: 2020-10-30 | Disposition: A | Discharge status: Home / Self Care | Payer: Self-pay | Source: Ambulatory Visit | Attending: Anesthesiology | Admitting: Anesthesiology

## 2020-10-30 NOTE — Patient Instructions (Signed)
Your procedure is scheduled on: 11/06/2020 Report to the Registration Desk on the 1st floor of the [facility]. To find out your arrival time, please call [phone] between 1PM - 3PM on: 11/05/2020   REMEMBER: Instructions that are not followed completely may result in serious medical risk, up to and including death; or upon the discretion of your surgeon and anesthesiologist your surgery may need to be rescheduled.  Do not eat food after midnight the night before surgery.  No gum chewing, lozengers or hard candies.  You may however, drink CLEAR liquids up to 2 hours before you are scheduled to arrive for your surgery. Do not drink anything within 2 hours of your scheduled arrival time.  Clear liquids include: - water    TAKE THESE MEDICATIONS THE MORNING OF SURGERY WITH A SIP OF WATER: Lipitor Coreg Tramadol as needed   Stop Metformin 2 days prior to surgery.last dose is Sept 3, 2022   Follow recommendations from Cardiologist, Pulmonologist or PCP regarding stopping Aspirin,  Plavix, ,   One week prior to surgery: Stop Anti-inflammatories (NSAIDS) such as Advil, Aleve, Ibuprofen, Motrin, Naproxen, Naprosyn and Aspirin based products such as Excedrin, Goodys Powder, BC Powder. Stop ANY OVER THE COUNTER supplements until after surgery. You may however, continue to take Tylenol if needed for pain up until the day of surgery.  No Alcohol for 24 hours before or after surgery.  No Smoking including e-cigarettes for 24 hours prior to surgery.  No chewable tobacco products for at least 6 hours prior to surgery.  No nicotine patches on the day of surgery.  Do not use any "recreational" drugs for at least a week prior to your surgery.  Please be advised that the combination of cocaine and anesthesia may have negative outcomes, up to and including death. If you test positive for cocaine, your surgery will be cancelled.  On the morning of surgery brush your teeth with toothpaste and  water, you may rinse your mouth with mouthwash if you wish. Do not swallow any toothpaste or mouthwash.  Do not wear jewelry.  Do not wear lotions, powders, or perfumes.   Do not shave body from the neck down 48 hours prior to surgery just in case you cut yourself which could leave a site for infection.  Also, freshly shaved skin may become irritated if using the CHG soap.  Contact lenses, hearing aids and dentures may not be worn into surgery.  Do not bring valuables to the hospital. [facility] is not responsible for any missing/lost belongings or valuables.   Use CHG Soap or wipes as directed on instruction sheet.- provided for you  Notify your doctor if there is any change in your medical condition (cold, fever, infection).  Wear comfortable clothing (specific to your surgery type) to the hospital.  After surgery, you can help prevent lung complications by doing breathing exercises.  Take deep breaths and cough every 1-2 hours. Your doctor may order a device called an Incentive Spirometer to help you take deep breaths. When coughing or sneezing, hold a pillow firmly against your incision with both hands. This is called "splinting." Doing this helps protect your incision. It also decreases belly discomfort.  If you are being admitted to the hospital overnight, leave your suitcase in the car. After surgery it may be brought to your room.  If you are being discharged the day of surgery, you will not be allowed to drive home. You will need a responsible adult (18 years  or older) to drive you home and stay with you that night.   If you are taking public transportation, you will need to have a responsible adult (18 years or older) with you. Please confirm with your physician that it is acceptable to use public transportation.   Please call the [facility] Dept[facility] at [phone] if you have any questions about these instructions.  Surgery Visitation Policy:  Patients  undergoing a surgery or procedure may have one family member or support person with them as long as that person is not COVID-19 positive or experiencing its symptoms.  That person may remain in the waiting area during the procedure.  Inpatient Visitation:    Visiting hours are 7 a.m. to 8 p.m. Inpatients will be allowed two visitors daily. The visitors may change each day during the patient's stay. No visitors under the age of 1. Any visitor under the age of 35 must be accompanied by an adult. The visitor must pass COVID-19 screenings, use hand sanitizer when entering and exiting the patient's room and wear a mask at all times, including in the patient's room. Patients must also wear a mask when staff or their visitor are in the room. Masking is required regardless of vaccination status.

## 2020-10-30 NOTE — Patient Instructions (Addendum)
Your procedure is scheduled on: 11/06/2020 Report to the Registration Desk on the 1st floor of the [facility].  To find out your arrival time, please call [phone] between 1PM - 3PM on: 11/05/2020   REMEMBER: Instructions that are not followed completely may result in serious medical risk, up to and including death; or upon the discretion of your surgeon and anesthesiologist your surgery may need to be rescheduled.  Do not eat food after midnight the night before surgery.  No gum chewing, lozengers or hard candies.  You may however, drink water up to 2 hours before you are scheduled to arrive for your surgery. Do not drink anything within 2 hours of your scheduled arrival time.    Type 2 diabetics should only drink water. TAKE THESE MEDICATIONS THE MORNING OF SURGERY WITH A SIP OF WATER: Atorvastatin Carvedilol   Stop Metformin  2 days prior to surgery. Last dose is Sept 3, 2022   Follow recommendations from Cardiologist regarding stopping Aspirin, Plavix,  Stop PLAVIX 7 days prior to surgey. Last dose is: August 31 Continue taking aspirin per your cardiologist.  One week prior to surgery: Stop Anti-inflammatories (NSAIDS) such as Advil, Aleve, Ibuprofen, Motrin, Naproxen, Naprosyn and Aspirin based products such as Excedrin, Goodys Powder, BC Powder. Stop ANY OVER THE COUNTER supplements until after surgery. You may however, continue to take Tylenol if needed for pain up until the day of surgery.  No Alcohol for 24 hours before or after surgery.  No Smoking including e-cigarettes for 24 hours prior to surgery.  No chewable tobacco products for at least 6 hours prior to surgery.  No nicotine patches on the day of surgery.  Do not use any "recreational" drugs for at least a week prior to your surgery.  Please be advised that the combination of cocaine and anesthesia may have negative outcomes, up to and including death. If you test positive for cocaine, your surgery will be  cancelled.  On the morning of surgery brush your teeth with toothpaste and water, you may rinse your mouth with mouthwash if you wish. Do not swallow any toothpaste or mouthwash.  Do not wear jewelry.   Do not wear lotions, powders, or perfumes.   Do not shave body from the neck down 48 hours prior to surgery just in case you cut yourself which could leave a site for infection.  Also, freshly shaved skin may become irritated if using the CHG soap.  Contact lenses, hearing aids and dentures may not be worn into surgery.  Do not bring valuables to the hospital. [facility] is not responsible for any missing/lost belongings or valuables.   Use CHG Soap or wipes as directed on instruction sheet.   Notify your doctor if there is any change in your medical condition (cold, fever, infection).  Wear comfortable clothing (specific to your surgery type) to the hospital.  After surgery, you can help prevent lung complications by doing breathing exercises.  Take deep breaths and cough every 1-2 hours. Your doctor may order a device called an Incentive Spirometer to help you take deep breaths.  If you are being admitted to the hospital overnight, leave your suitcase in the car. After surgery it may be brought to your room.  If you are being discharged the day of surgery, you will not be allowed to drive home. You will need a responsible adult (18 years or older) to drive you home and stay with you that night.   If you are  taking public transportation, you will need to have a responsible adult (18 years or older) with you. Please confirm with your physician that it is acceptable to use public transportation.   Please call the [facility] Dept[facility] at [phone] if you have any questions about these instructions.  Surgery Visitation Policy:  Patients undergoing a surgery or procedure may have one family member or support person with them as long as that person is not COVID-19  positive or experiencing its symptoms.  That person may remain in the waiting area during the procedure.  Inpatient Visitation:    Visiting hours are 7 a.m. to 8 p.m. Inpatients will be allowed two visitors daily. The visitors may change each day during the patient's stay. No visitors under the age of 39. Any visitor under the age of 53 must be accompanied by an adult. The visitor must pass COVID-19 screenings, use hand sanitizer when entering and exiting the patient's room and wear a mask at all times, including in the patient's room. Patients must also wear a mask when staff or their visitor are in the room. Masking is required regardless of vaccination status. Your procedure is scheduled on: Report to the Registration Desk on the 1st floor of the [facility]. To find out your arrival time, please call [phone] between 1PM - 3PM on:

## 2020-10-30 NOTE — Progress Notes (Signed)
Discussed patient instructions over the phone 10/30/2020 with patient and with the help of interpreter. Patient verbalized understanding.

## 2020-10-31 ENCOUNTER — Encounter: Payer: Self-pay | Admitting: Surgery

## 2020-10-31 NOTE — Progress Notes (Signed)
Perioperative Services  Pre-Admission/Anesthesia Testing Clinical Review  Date: 10/31/20  Patient Demographics:  Name: Lucas [name] DOB:   1955/11/12 MRN:   [id number]  Planned Surgical Procedure(s):    Case: [id number] Date/Time: 11/06/20 0915   Procedure: XI ROBOTIC ASSISTED INGUINAL HERNIA (Right)   Anesthesia type: General   Pre-op diagnosis: right inguinal hernia   Location: [facility] OR ROOM 04 / [place] ORS FOR ANESTHESIA GROUP   Surgeons: Lucas Ree, MD     NOTE: Available PAT nursing documentation and vital signs have been reviewed. Clinical nursing staff has updated patient's PMH/PSHx, current medication list, and drug allergies/intolerances to ensure comprehensive history available to assist in medical decision making as it pertains to the aforementioned surgical procedure and anticipated anesthetic course. Extensive review of available clinical information performed. Lucas [name] PMH and PSHx updated with any diagnoses/procedures that  may have been inadvertently omitted during his intake with the pre-admission testing department's nursing staff.  Clinical Discussion:  Lucas [name] is a 65 y.o. male who is submitted for pre-surgical anesthesia review and clearance prior to him undergoing the above procedure. Patient has never been a smoker. Pertinent PMH includes: CAD, STEMI, ischemic cardiomyopathy, combined systolic/diastolic heart failure, HTN, HLD, T2DM.  Patient is followed by cardiology (End, MD). He was last seen in the cardiology clinic on 10/25/2020; notes reviewed.  At the time of his clinic visit, patient noted to be doing "very well" from a cardiac perspective.  He denied any episodes of chest pain, shortness of breath, PND, orthopnea palpitations, significant peripheral edema, vertiginous symptoms, or presyncope/syncope.  PMH significant for cardiovascular diagnoses.  Patient presented to the ED with 4-day complaint of chest pain with associated DOE  and diaphoresis on 01/25/2017; notes reviewed.  Patient diagnosed with a late presenting anterior STEMI.  Diagnostic left heart catheterization performed revealing a moderately to severely reduced left ventricular systolic function.  There was acute plaque rupture and 100% thrombotic occlusion of the proximal AV.  LVEDP elevated.  3.0 x 23 mm Xience Sierra DES x 1 placed to the proximal LAD and further post dilated using balloon resulting in a 0% residual stenosis and restoration of TIMI-3 flow. TTE was performed on 01/26/2017 revealing moderate to severe reduction and overall left ventricular systolic function with an EF of 30-35%.  There was severe hypokinesis of the anteroseptal, anterior, and apical myocardium.  Doppler parameters consistent with abnormal laxation (G1DD).  There was mild mitral valve regurgitation.  Given recent large anterior MI and severely reduced LVEF, patient was fitted with a Life Vest at the time of discharge.  TTE was repeated on 05/29/2017 revealing some improvement in his overall EF to 40-45%.  There were areas of persistent anteroseptal, anterior, and apical hypokinesis.  Doppler parameters consistent with abnormal laxation.  There was mild mitral valve vegetation.  PASP within normal range.  Exercise tolerance test performed on 11/06/2017 revealed overall good exercise capacity with normal heart rate and blood pressure.  Baseline EKG showed normal sinus rhythm with anteroseptal Q waves and nonspecific T wave abnormalities.  During stress, there was horizontal ST segment depression noted in the inferior leads.  There was no significant arrhythmia observed.  Patient complained of neck and jaw discomfort during peak stress and into early recovery.  Study determined to be intermediate risk.  Repeat diagnostic heart catheterization performed on 11/20/2017 revealing an overall LVEF of 45-50%.  There was a widely patent LAD stent in place with no significant ISR.  There was 20%  stenosis of  the ostial first diagonal and 30% stenosis of the proximal RCA.  Proximal LAD felt to be the culprit lesion (B2 and thrombotic); PTCA was performed.  LVEDP mildly elevated.  Patient remains on daily DAPT therapy (ASA + clopidogrel).;  Compliant with therapy with no evidence of GI bleeding.  Blood pressure well controlled at 122/74 on currently prescribed beta-blocker, diuretic, and ARB/ARNi therapies.  Patient is on a statin for his HLD.  T2DM well-controlled on currently prescribed regimen; last Hgb A1c 6.2% when checked on 11/02/2019. Functional capacity, as defined by DASI, is documented as being >/= 4 METS.  No changes were made to patient's medication regimen.  Patient to follow-up with outpatient cardiology in 3 months or sooner if needed.  Patient is scheduled to undergo an elective robot-assisted inguinal hernia repair on 11/06/2020 with Dr. Ardath Sax, MD.  Given patient's past medical history significant for cardiovascular diagnoses and interventions, presurgical cardiac clearance was sought by the PAT team.  Per cardiology, "RCRI places patient at a moderate risk for noncardiac surgery. Patient may proceed with planned surgical intervention and an overall MODERATE risk without need for further cardiovascular testing at this time".  Again, patient is on daily DAPT therapy.  He has been instructed on recommendations for holding his clopidogrel for 5 days prior to his procedure with plans to restart as soon as postoperative bleeding risk felt to be minimized by his primary attending surgeon.  Patient will continue his daily low-dose ASA throughout the perioperative period.  He is aware that his last dose of clopidogrel will be on 10/31/2020.  Lucas [name] denies any previous perioperative complications with anesthesia in the past.  In review of his EMR, there are no records available for review regarding past procedural/anesthetic courses within the [facility] system.    Vitals with BMI 10/25/2020 10/24/2020 10/16/2020  Height 5' 0"  5' 0"  -  Weight 144 lbs 145 lbs 13 oz -  BMI 10.25 85.27 -  Systolic [phone]  Diastolic 74 78 75  Pulse 71 84 61    Providers/Specialists:   NOTE: Primary physician provider listed below. Patient may have been seen by APP or partner within same practice.   PROVIDER ROLE / SPECIALTY LAST Delight Stare, MD General Surgery 10/24/2020  [name], MD Primary Care Provider ???  End, Harrell Gave, MD Cardiology 10/25/2020   Allergies:  Patient has no known allergies.  Current Home Medications:   No current facility-administered medications for this encounter.    aspirin 81 MG chewable tablet   atorvastatin (LIPITOR) 10 MG tablet   carvedilol (COREG) 12.5 MG tablet   clopidogrel (PLAVIX) 75 MG tablet   metFORMIN (GLUCOPHAGE) 500 MG tablet   sacubitril-valsartan (ENTRESTO) 24-26 MG   spironolactone (ALDACTONE) 25 MG tablet   traMADol (ULTRAM) 50 MG tablet   History:   Past Medical History:  Diagnosis Date   Acute ST elevation myocardial infarction (STEMI) involving left anterior descending (LAD) coronary artery ([place])    a. 01/2017 Ant STEMI/PCI: LAD 20ost, 100p (3.0x23 Xience Sierra DES).   CAD (coronary artery disease)    a. 01/2017 Ant STEMI/PCI: LM 20d, LAD 20ost, 100p (3.0x23 Xience Sierra DES), 24m D1 50ost, LCX nl, RCA 30p; b. 11/2017 MV: Intermediate risk w/ 174minf ST dep; c. 11/2017 Cath: LM nl, LAD patent prox stent, D1 20ost, LCX nl, RCA 30p. EF 45-50%.   Chronic anticoagulation    DAPT (ASA + clopidogrel)   Chronic combined systolic (congestive) and diastolic (congestive)  heart failure ([place])    a. 01/2018 Echo: EF 30-35%, sev antsept, ant, apical HK. Gr1 DD, mild MR.   Essential hypertension    Hyperlipidemia    a. Myalgias with high dose lipitor - tolerating 82m daily.   Ischemic cardiomyopathy    a. 01/2018 Echo: EF 30-35%.   Type II diabetes mellitus ([place]    Past Surgical History:   Procedure Laterality Date   APPENDECTOMY     CARDIAC CATHETERIZATION     CORONARY ANGIOPLASTY     CORONARY/GRAFT ACUTE MI REVASCULARIZATION N/A 01/25/2017   Procedure: Coronary/Graft Acute MI Revascularization;  Surgeon: ENelva Bush MD;  [facility];  Service: Cardiovascular;  Laterality: N/A;   LEFT HEART CATH AND CORONARY ANGIOGRAPHY N/A 01/25/2017   Procedure: LEFT HEART CATH AND CORONARY ANGIOGRAPHY;  Surgeon: ENelva Bush MD;  [facility];  Service: Cardiovascular;  Laterality: N/A;   LEFT HEART CATH AND CORONARY ANGIOGRAPHY N/A 11/20/2017   Procedure: LEFT HEART CATH AND CORONARY ANGIOGRAPHY;  Surgeon: AWellington Hampshire MD;  [facility];  Service: Cardiovascular;  Laterality: N/A;   Family History  Problem Relation Age of Onset   Heart disease Father        s/p pacemaker   Arrhythmia Father    Asthma Father    Asthma Mother    Social History   Tobacco Use   Smoking status: Never   Smokeless tobacco: Never  Vaping Use   Vaping Use: Never used  Substance Use Topics   Alcohol use: No   Drug use: No    Pertinent Clinical Results:  LABS: Labs reviewed: Acceptable for surgery.  No visits with results within 3 Day(s) from this visit.  Latest known visit with results is:  Admission on 10/16/2020, Discharged on 10/16/2020  Component Date Value Ref Range Status   Lipase 10/16/2020 47  11 - 51 U/L Final   [facility] [address] [place]  Sodium 10/16/2020 137  135 - 145 mmol/L Final   Potassium 10/16/2020 3.8  3.5 - 5.1 mmol/L Final   Chloride 10/16/2020 102  98 - 111 mmol/L Final   CO2 10/16/2020 26  22 - 32 mmol/L Final   Glucose, Bld 10/16/2020 133 (A) 70 - 99 mg/dL Final   Glucose reference range applies only to samples taken after fasting for at least 8 hours.   BUN 10/16/2020 10  8 - 23 mg/dL Final   Creatinine, Ser 10/16/2020 0.81  0.61 - 1.24 mg/dL  Final   Calcium 10/16/2020 8.9  8.9 - 10.3 mg/dL Final   Total Protein 10/16/2020 7.0  6.5 - 8.1 g/dL Final   Albumin 10/16/2020 4.0  3.5 - 5.0 g/dL Final   AST 10/16/2020 18  15 - 41 U/L Final   ALT 10/16/2020 17  0 - 44 U/L Final   Alkaline Phosphatase 10/16/2020 48  38 - 126 U/L Final   Total Bilirubin 10/16/2020 0.8  0.3 - 1.2 mg/dL Final   GFR, Estimated 10/16/2020 >60  >60 mL/min Final   Comment: (NOTE) Calculated using the CKD-EPI Creatinine Equation (2021)    Anion gap 10/16/2020 9  5 - 15 Final   [facility] [address] [place]  WBC 10/16/2020 6.3  4.0 - 10.5 K/uL Final   RBC 10/16/2020 4.91  4.22 - 5.81 MIL/uL Final   Hemoglobin 10/16/2020 16.1  13.0 - 17.0 g/dL Final  HCT 10/16/2020 45.0  39.0 - 52.0 % Final   MCV 10/16/2020 91.6  80.0 - 100.0 fL Final   MCH 10/16/2020 32.8  26.0 - 34.0 pg Final   MCHC 10/16/2020 35.8  30.0 - 36.0 g/dL Final   RDW 10/16/2020 12.2  11.5 - 15.5 % Final   Platelets 10/16/2020 190  150 - 400 K/uL Final   nRBC 10/16/2020 0.0  0.0 - 0.2 % Final   Performed at [facility], [place], [place] [address]   Color, Urine 10/16/2020 COLORLESS (A) YELLOW Final   APPearance 10/16/2020 CLEAR (A) CLEAR Final   Specific Gravity, Urine 10/16/2020 1.025  1.005 - 1.030 Final   pH 10/16/2020 7.0  5.0 - 8.0 Final   Glucose, UA 10/16/2020 NEGATIVE  NEGATIVE mg/dL Final   Hgb urine dipstick 10/16/2020 NEGATIVE  NEGATIVE Final   Bilirubin Urine 10/16/2020 NEGATIVE  NEGATIVE Final   Ketones, ur 10/16/2020 NEGATIVE  NEGATIVE mg/dL Final   Protein, ur 10/16/2020 NEGATIVE  NEGATIVE mg/dL Final   Nitrite 10/16/2020 NEGATIVE  NEGATIVE Final   Leukocytes,Ua 10/16/2020 NEGATIVE  NEGATIVE Final   RBC / HPF 10/16/2020 0-5  0 - 5 RBC/hpf Final   WBC, UA 10/16/2020 0-5  0 - 5 WBC/hpf Final   Bacteria, UA 10/16/2020 NONE SEEN  NONE SEEN Final   Squamous Epithelial / LPF 10/16/2020 NONE SEEN  0 - 5  Final   Performed at [facility], [address] [place], [place] [address]    ECG: Date: 10/25/2020 Time ECG obtained: 0955 AM Rate: 71 bpm Rhythm:  Normal sinus rhythm with sinus arrhythmia Axis (leads I and aVF): Normal Intervals: PR 168 ms. QRS 88 ms. QTc 404 ms. ST segment and T wave changes: No evidence of acute ST segment elevation or depression.  Evidence of an age undetermined septal infarct present. Comparison: Similar to previous tracing obtained on 10/16/2020   IMAGING / PROCEDURES: LEFT HEART CATHETERIZATION AND CORONARY ANGIOGRAPHY performed on 11/20/2017 Mildly reduced left ventricular systolic function with an EF of 45% Mid to distal anterior wall hypokinesis Mildly elevated LVEDP 20% stenosis of the ostial first diagonal 30% stenosis of the proximal RCA Previously placed DES to the proximal LAD is widely patent, however felt to be the culprit lesion, as lesion type is P2 with thrombotic.  PTCA was performed.     EXERCISE TOLERANCE TEST performed at 11/06/2017 Baseline ECG demonstrates normal sinus rhythm with anteroseptal Q waves and nonspecific T wave abnormalities Patient demonstrates good exercise tolerance with normal heart rate and blood pressure responses Neck and jaw discomfort noted during peak stress and into early recovery Horizontal ST segment depression noted during stress and the II, III, and aVF leads No significant arrhythmia noted Intermediate risk study ([facility] treadmill score = 0)  TRANSTHORACIC ECHOCARDIOGRAM performed on 05/29/2017 Left ventricular systolic function mild to moderately reduced with an EF of 40-45% Right ventricular systolic function normal Anterior, anteroseptal, and apical myocardial hypokinesis noted Doppler parameters consistent with abnormal left-ventricular relaxation (G1DD) There was mild mitral valve regurgitation Left atrium normal in size PASP within normal range  TRANSTHORACIC ECHOCARDIOGRAM performed  on 01/26/2017 Moderately to severely reduced left ventricular systolic function with an EF of 30-35% Severe anteroseptal, anterior, and apical myocardial hypokinesis Doppler parameters consistent with abnormal left-ventricular relaxation (G1DD) There was mild mitral valve regurgitation.  LEFT HEART CATHETERIZATION AND CORONARY ANGIOGRAPHY performed on 01/25/2017 Late presenting anterior STEMI with acute plaque rupture in 100% thrombotic occlusion of the proximal LAD  Mild to moderate nonobstructive disease involving the mid/distal LAD and RCA Moderately to severely reduced left ventricular contraction Moderately elevated left ventricular filling pressures Successful PCI of the proximal LAD 3.0 x 23 mm Xience Sierra DES x 1 placed; post dilated with a 3.5 x 20 mm Euphora balloon at 16 atm resulting in 0% residual stenosis and restoration of TIMI-3 flow.   Impression and Plan:  Denvil Palestino Terisa [name] has been referred for pre-anesthesia review and clearance prior to him undergoing the planned anesthetic and procedural courses. Available labs, pertinent testing, and imaging results were personally reviewed by me. This patient has been appropriately cleared by cardiology with an overall MODERATE risk of significant perioperative cardiovascular complications.  Based on clinical review performed today (10/31/20), barring any significant acute changes in the patient's overall condition, it is anticipated that he will be able to proceed with the planned surgical intervention. Any acute changes in clinical condition may necessitate his procedure being postponed and/or cancelled. Patient will meet with anesthesia team (MD and/or CRNA) on the day of his procedure for preoperative evaluation/assessment. Questions regarding anesthetic course will be fielded at that time.   Pre-surgical instructions were reviewed with the patient during his PAT appointment and questions were fielded by PAT clinical staff.  Patient was advised that if any questions or concerns arise prior to his procedure then he should return a call to PAT and/or his surgeon's office to discuss.  Honor Loh, MSN, APRN, FNP-C, CEN [facility] [address]  Peri-operative Services Nurse Practitioner Phone: [phone] Fax: [phone] 10/31/20 8:55 AM  NOTE: This note has been prepared using Dragon dictation software. Despite my best ability to proofread, there is always the potential that unintentional transcriptional errors may still occur from this process.

## 2020-11-01 ENCOUNTER — Telehealth: Payer: Self-pay

## 2020-11-01 NOTE — Telephone Encounter (Signed)
Please call to discuss status of clearance

## 2020-11-01 NOTE — Telephone Encounter (Signed)
Returned call to [place], she wasn't available.  I let the receptionist know that I have re-faxed the surgical clearance over and to have Angela to call me back only if she don't receive it.

## 2020-11-01 NOTE — Telephone Encounter (Signed)
Error

## 2020-11-01 NOTE — Telephone Encounter (Addendum)
Received cardiac clearance from Christell Faith, NP. He may proceed with surgery at an overall moderate risk. He has been advised to stop clopidogrel beginning 11/01/2020 (last dose 10/31/2020) to allow 5 day washout. Recommend resuming clopidogrel when safely possible from a bleeding perspective per surgical team. He will remain on aspirin 81 mg daily throughout the perioperative timeframe.  All notes are in Epic.

## 2020-11-06 ENCOUNTER — Encounter: Payer: Self-pay | Admitting: Surgery

## 2020-11-06 ENCOUNTER — Other Ambulatory Visit: Payer: Self-pay

## 2020-11-06 ENCOUNTER — Ambulatory Visit: Payer: Self-pay | Admitting: Urgent Care

## 2020-11-06 ENCOUNTER — Ambulatory Visit
Admission: RE | Admit: 2020-11-06 | Discharge: 2020-11-06 | Disposition: A | Discharge status: Home / Self Care | Payer: Self-pay | Attending: Surgery | Admitting: Surgery

## 2020-11-06 ENCOUNTER — Encounter
Admission: RE | Disposition: A | Discharge status: Home / Self Care | Payer: Self-pay | Source: Home / Self Care | Attending: Surgery

## 2020-11-06 DIAGNOSIS — K4030 Unilateral inguinal hernia, with obstruction, without gangrene, not specified as recurrent: Secondary | ICD-10-CM

## 2020-11-06 DIAGNOSIS — I110 Hypertensive heart disease with heart failure: Secondary | ICD-10-CM | POA: Insufficient documentation

## 2020-11-06 DIAGNOSIS — Z7982 Long term (current) use of aspirin: Secondary | ICD-10-CM | POA: Insufficient documentation

## 2020-11-06 DIAGNOSIS — E785 Hyperlipidemia, unspecified: Secondary | ICD-10-CM | POA: Insufficient documentation

## 2020-11-06 DIAGNOSIS — D176 Benign lipomatous neoplasm of spermatic cord: Secondary | ICD-10-CM | POA: Insufficient documentation

## 2020-11-06 DIAGNOSIS — I255 Ischemic cardiomyopathy: Secondary | ICD-10-CM | POA: Insufficient documentation

## 2020-11-06 DIAGNOSIS — Z79899 Other long term (current) drug therapy: Secondary | ICD-10-CM | POA: Insufficient documentation

## 2020-11-06 DIAGNOSIS — E119 Type 2 diabetes mellitus without complications: Secondary | ICD-10-CM | POA: Insufficient documentation

## 2020-11-06 DIAGNOSIS — Z7902 Long term (current) use of antithrombotics/antiplatelets: Secondary | ICD-10-CM | POA: Insufficient documentation

## 2020-11-06 DIAGNOSIS — I2510 Atherosclerotic heart disease of native coronary artery without angina pectoris: Secondary | ICD-10-CM | POA: Insufficient documentation

## 2020-11-06 DIAGNOSIS — Z7901 Long term (current) use of anticoagulants: Secondary | ICD-10-CM | POA: Insufficient documentation

## 2020-11-06 DIAGNOSIS — Z955 Presence of coronary angioplasty implant and graft: Secondary | ICD-10-CM | POA: Insufficient documentation

## 2020-11-06 DIAGNOSIS — I5043 Acute on chronic combined systolic (congestive) and diastolic (congestive) heart failure: Secondary | ICD-10-CM | POA: Insufficient documentation

## 2020-11-06 DIAGNOSIS — I252 Old myocardial infarction: Secondary | ICD-10-CM | POA: Insufficient documentation

## 2020-11-06 DIAGNOSIS — K4090 Unilateral inguinal hernia, without obstruction or gangrene, not specified as recurrent: Secondary | ICD-10-CM | POA: Insufficient documentation

## 2020-11-06 HISTORY — DX: Long term (current) use of anticoagulants: Z79.01

## 2020-11-06 HISTORY — PX: XI ROBOTIC ASSISTED INGUINAL HERNIA REPAIR WITH MESH: SHX6706

## 2020-11-06 LAB — GLUCOSE, CAPILLARY
Glucose-Capillary: 103 mg/dL — ABNORMAL HIGH (ref 70–99)
Glucose-Capillary: 162 mg/dL — ABNORMAL HIGH (ref 70–99)

## 2020-11-06 SURGERY — REPAIR, HERNIA, INGUINAL, ROBOT-ASSISTED, LAPAROSCOPIC, USING MESH
Anesthesia: General | Site: Inguinal | Laterality: Right

## 2020-11-06 MED ORDER — MIDAZOLAM HCL 2 MG/2ML IJ SOLN
INTRAMUSCULAR | Status: AC
  Filled 2020-11-06: qty 2

## 2020-11-06 MED ORDER — ROCURONIUM BROMIDE 100 MG/10ML IV SOLN
INTRAVENOUS | Status: DC | PRN
  Administered 2020-11-06: 30 mg via INTRAVENOUS
  Administered 2020-11-06: 40 mg via INTRAVENOUS

## 2020-11-06 MED ORDER — ONDANSETRON HCL 4 MG/2ML IJ SOLN
INTRAMUSCULAR | Status: AC
  Filled 2020-11-06: qty 2

## 2020-11-06 MED ORDER — FAMOTIDINE 20 MG PO TABS: 20.0000 mg | ORAL_TABLET | Freq: Once | ORAL | Status: AC

## 2020-11-06 MED ORDER — FAMOTIDINE 20 MG PO TABS
ORAL_TABLET | ORAL | Status: AC
  Administered 2020-11-06: 20 mg via ORAL
  Filled 2020-11-06: qty 1

## 2020-11-06 MED ORDER — ACETAMINOPHEN 500 MG PO TABS: 1000.0000 mg | ORAL_TABLET | ORAL | Status: AC

## 2020-11-06 MED ORDER — SUGAMMADEX SODIUM 200 MG/2ML IV SOLN
INTRAVENOUS | Status: DC | PRN
  Administered 2020-11-06: 125 mg via INTRAVENOUS

## 2020-11-06 MED ORDER — CHLORHEXIDINE GLUCONATE 0.12 % MT SOLN
OROMUCOSAL | Status: AC
  Administered 2020-11-06: 15 mL via OROMUCOSAL
  Filled 2020-11-06: qty 15

## 2020-11-06 MED ORDER — ACETAMINOPHEN 500 MG PO TABS: 1000.0000 mg | ORAL_TABLET | Freq: Four times a day (QID) | ORAL | Status: DC | PRN

## 2020-11-06 MED ORDER — GLYCOPYRROLATE 0.2 MG/ML IJ SOLN
INTRAMUSCULAR | Status: DC | PRN
  Administered 2020-11-06: .2 mg via INTRAVENOUS

## 2020-11-06 MED ORDER — EPHEDRINE SULFATE 50 MG/ML IJ SOLN
INTRAMUSCULAR | Status: DC | PRN
  Administered 2020-11-06: 10 mg via INTRAVENOUS

## 2020-11-06 MED ORDER — BUPIVACAINE LIPOSOME 1.3 % IJ SUSP: 20.0000 mL | Freq: Once | INTRAMUSCULAR | Status: DC

## 2020-11-06 MED ORDER — OXYCODONE HCL 5 MG PO TABS
ORAL_TABLET | ORAL | Status: AC
  Administered 2020-11-06: 5 mg via ORAL
  Filled 2020-11-06: qty 1

## 2020-11-06 MED ORDER — EPHEDRINE 5 MG/ML INJ
INTRAVENOUS | Status: AC
  Filled 2020-11-06: qty 5

## 2020-11-06 MED ORDER — FENTANYL CITRATE (PF) 100 MCG/2ML IJ SOLN: 25.0000 ug | INTRAMUSCULAR | Status: DC | PRN

## 2020-11-06 MED ORDER — CHLORHEXIDINE GLUCONATE 0.12 % MT SOLN: 15.0000 mL | Freq: Once | OROMUCOSAL | Status: AC

## 2020-11-06 MED ORDER — LIDOCAINE HCL (CARDIAC) PF 100 MG/5ML IV SOSY
PREFILLED_SYRINGE | INTRAVENOUS | Status: DC | PRN
  Administered 2020-11-06: 100 mg via INTRAVENOUS

## 2020-11-06 MED ORDER — CHLORHEXIDINE GLUCONATE CLOTH 2 % EX PADS: 6.0000 | MEDICATED_PAD | Freq: Once | CUTANEOUS | Status: DC

## 2020-11-06 MED ORDER — BUPIVACAINE-EPINEPHRINE (PF) 0.25% -1:200000 IJ SOLN
INTRAMUSCULAR | Status: AC
  Filled 2020-11-06: qty 30

## 2020-11-06 MED ORDER — PROPOFOL 10 MG/ML IV BOLUS
INTRAVENOUS | Status: AC
  Filled 2020-11-06: qty 20

## 2020-11-06 MED ORDER — DEXAMETHASONE SODIUM PHOSPHATE 10 MG/ML IJ SOLN
INTRAMUSCULAR | Status: AC
  Filled 2020-11-06: qty 1

## 2020-11-06 MED ORDER — FENTANYL CITRATE (PF) 100 MCG/2ML IJ SOLN
INTRAMUSCULAR | Status: AC
  Filled 2020-11-06: qty 2

## 2020-11-06 MED ORDER — BUPIVACAINE LIPOSOME 1.3 % IJ SUSP
INTRAMUSCULAR | Status: DC | PRN
  Administered 2020-11-06: 20 mL

## 2020-11-06 MED ORDER — LIDOCAINE HCL (PF) 2 % IJ SOLN
INTRAMUSCULAR | Status: AC
  Filled 2020-11-06: qty 5

## 2020-11-06 MED ORDER — ONDANSETRON HCL 4 MG/2ML IJ SOLN
INTRAMUSCULAR | Status: DC | PRN
  Administered 2020-11-06: 4 mg via INTRAVENOUS

## 2020-11-06 MED ORDER — OXYCODONE HCL 5 MG/5ML PO SOLN
5.0000 mg | Freq: Once | ORAL | Status: AC | PRN
Start: 2020-11-06 — End: 2020-11-06

## 2020-11-06 MED ORDER — BUPIVACAINE LIPOSOME 1.3 % IJ SUSP
INTRAMUSCULAR | Status: AC
  Filled 2020-11-06: qty 20

## 2020-11-06 MED ORDER — MIDAZOLAM HCL 2 MG/2ML IJ SOLN
INTRAMUSCULAR | Status: DC | PRN
  Administered 2020-11-06: 2 mg via INTRAVENOUS

## 2020-11-06 MED ORDER — DEXAMETHASONE SODIUM PHOSPHATE 10 MG/ML IJ SOLN
INTRAMUSCULAR | Status: DC | PRN
  Administered 2020-11-06: 6 mg via INTRAVENOUS

## 2020-11-06 MED ORDER — GABAPENTIN 300 MG PO CAPS
ORAL_CAPSULE | ORAL | Status: AC
  Administered 2020-11-06: 300 mg via ORAL
  Filled 2020-11-06: qty 1

## 2020-11-06 MED ORDER — OXYCODONE HCL 5 MG PO TABS: 5.0000 mg | ORAL_TABLET | ORAL | 0 refills | Status: DC | PRN

## 2020-11-06 MED ORDER — FENTANYL CITRATE (PF) 100 MCG/2ML IJ SOLN
INTRAMUSCULAR | Status: AC
  Administered 2020-11-06: 50 ug via INTRAVENOUS
  Filled 2020-11-06: qty 2

## 2020-11-06 MED ORDER — SODIUM CHLORIDE 0.9 % IV SOLN: INTRAVENOUS | Status: DC

## 2020-11-06 MED ORDER — 0.9 % SODIUM CHLORIDE (POUR BTL) OPTIME
TOPICAL | Status: DC | PRN
  Administered 2020-11-06: 500 mL

## 2020-11-06 MED ORDER — GABAPENTIN 300 MG PO CAPS: 300.0000 mg | ORAL_CAPSULE | ORAL | Status: AC

## 2020-11-06 MED ORDER — OXYCODONE HCL 5 MG PO TABS: 5.0000 mg | ORAL_TABLET | Freq: Once | ORAL | Status: AC | PRN

## 2020-11-06 MED ORDER — PHENYLEPHRINE HCL (PRESSORS) 10 MG/ML IV SOLN
INTRAVENOUS | Status: AC
  Filled 2020-11-06: qty 1

## 2020-11-06 MED ORDER — ACETAMINOPHEN 500 MG PO TABS
ORAL_TABLET | ORAL | Status: AC
  Administered 2020-11-06: 1000 mg via ORAL
  Filled 2020-11-06: qty 2

## 2020-11-06 MED ORDER — ORAL CARE MOUTH RINSE: 15.0000 mL | Freq: Once | OROMUCOSAL | Status: AC

## 2020-11-06 MED ORDER — MEPERIDINE HCL 25 MG/ML IJ SOLN: 6.2500 mg | INTRAMUSCULAR | Status: DC | PRN

## 2020-11-06 MED ORDER — FENTANYL CITRATE (PF) 100 MCG/2ML IJ SOLN
INTRAMUSCULAR | Status: DC | PRN
  Administered 2020-11-06 (×3): 50 ug via INTRAVENOUS

## 2020-11-06 MED ORDER — BUPIVACAINE-EPINEPHRINE 0.25% -1:200000 IJ SOLN
INTRAMUSCULAR | Status: DC | PRN
  Administered 2020-11-06: 30 mL

## 2020-11-06 MED ORDER — ROCURONIUM BROMIDE 10 MG/ML (PF) SYRINGE
PREFILLED_SYRINGE | INTRAVENOUS | Status: AC
  Filled 2020-11-06: qty 10

## 2020-11-06 MED ORDER — CEFAZOLIN SODIUM-DEXTROSE 2-4 GM/100ML-% IV SOLN
2.0000 g | INTRAVENOUS | Status: AC
  Administered 2020-11-06: 2 g via INTRAVENOUS

## 2020-11-06 MED ORDER — CEFAZOLIN SODIUM-DEXTROSE 2-4 GM/100ML-% IV SOLN
INTRAVENOUS | Status: AC
  Filled 2020-11-06: qty 100

## 2020-11-06 MED ORDER — PROMETHAZINE HCL 25 MG/ML IJ SOLN: 6.2500 mg | INTRAMUSCULAR | Status: DC | PRN

## 2020-11-06 MED ORDER — PROPOFOL 10 MG/ML IV BOLUS
INTRAVENOUS | Status: DC | PRN
  Administered 2020-11-06: 100 mg via INTRAVENOUS

## 2020-11-06 SURGICAL SUPPLY — 62 items
ADH SKN CLS APL DERMABOND .7 (GAUZE/BANDAGES/DRESSINGS) ×2
APL PRP STRL LF DISP 70% ISPRP (MISCELLANEOUS) ×2
BAG SPEC RTRVL LRG 6X4 10 (ENDOMECHANICALS) ×2
CANNULA REDUC XI 12-8 STAPL (CANNULA) ×1
CANNULA REDUCER 12-8 DVNC XI (CANNULA) ×2 IMPLANT
CHLORAPREP W/TINT 26 (MISCELLANEOUS) ×3 IMPLANT
COVER TIP SHEARS 8 DVNC (MISCELLANEOUS) ×2 IMPLANT
COVER TIP SHEARS 8MM DA VINCI (MISCELLANEOUS) ×1
COVER WAND RF STERILE (DRAPES) ×3 IMPLANT
DEFOGGER SCOPE WARMER CLEARIFY (MISCELLANEOUS) ×3 IMPLANT
DERMABOND ADVANCED (GAUZE/BANDAGES/DRESSINGS) ×1
DERMABOND ADVANCED .7 DNX12 (GAUZE/BANDAGES/DRESSINGS) ×2 IMPLANT
DRAPE ARM DVNC X/XI (DISPOSABLE) ×8 IMPLANT
DRAPE COLUMN DVNC XI (DISPOSABLE) ×2 IMPLANT
DRAPE DA VINCI XI ARM (DISPOSABLE) ×4
DRAPE DA VINCI XI COLUMN (DISPOSABLE) ×1
ELECT CAUTERY BLADE TIP 2.5 (TIP) ×3
ELECT REM PT RETURN 9FT ADLT (ELECTROSURGICAL) ×3
ELECTRODE CAUTERY BLDE TIP 2.5 (TIP) ×2 IMPLANT
ELECTRODE REM PT RTRN 9FT ADLT (ELECTROSURGICAL) ×2 IMPLANT
GAUZE 4X4 16PLY ~~LOC~~+RFID DBL (SPONGE) ×3 IMPLANT
GLOVE SURG SYN 7.0 (GLOVE) ×6 IMPLANT
GLOVE SURG SYN 7.5  E (GLOVE) ×2
GLOVE SURG SYN 7.5 E (GLOVE) ×4 IMPLANT
GOWN STRL REUS W/ TWL LRG LVL3 (GOWN DISPOSABLE) ×8 IMPLANT
GOWN STRL REUS W/TWL LRG LVL3 (GOWN DISPOSABLE) ×12
IRRIGATION STRYKERFLOW (MISCELLANEOUS) ×2 IMPLANT
IRRIGATOR STRYKERFLOW (MISCELLANEOUS) ×3
IV NS 1000ML (IV SOLUTION)
IV NS 1000ML BAXH (IV SOLUTION) IMPLANT
KIT PINK PAD W/HEAD ARE REST (MISCELLANEOUS) ×3
KIT PINK PAD W/HEAD ARM REST (MISCELLANEOUS) ×2 IMPLANT
LABEL OR SOLS (LABEL) ×3 IMPLANT
MANIFOLD NEPTUNE II (INSTRUMENTS) ×3 IMPLANT
MESH 3DMAX 4X6 LT LRG (Mesh General) IMPLANT
MESH 3DMAX 4X6 RT LRG (Mesh General) ×1 IMPLANT
MESH 3DMAX MID 4X6 RT LRG (Mesh General) ×2 IMPLANT
NEEDLE HYPO 22GX1.5 SAFETY (NEEDLE) ×3 IMPLANT
NEEDLE INSUFFLATION 14GA 120MM (NEEDLE) ×3 IMPLANT
NS IRRIG 500ML POUR BTL (IV SOLUTION) ×3 IMPLANT
OBTURATOR OPTICAL STANDARD 8MM (TROCAR) ×1
OBTURATOR OPTICAL STND 8 DVNC (TROCAR) ×2
OBTURATOR OPTICALSTD 8 DVNC (TROCAR) ×2 IMPLANT
PACK LAP CHOLECYSTECTOMY (MISCELLANEOUS) ×3 IMPLANT
PENCIL ELECTRO HAND CTR (MISCELLANEOUS) ×3 IMPLANT
POUCH SPECIMEN RETRIEVAL 10MM (ENDOMECHANICALS) ×3 IMPLANT
SEAL CANN UNIV 5-8 DVNC XI (MISCELLANEOUS) ×6 IMPLANT
SEAL XI 5MM-8MM UNIVERSAL (MISCELLANEOUS) ×3
SET TUBE SMOKE EVAC HIGH FLOW (TUBING) ×3 IMPLANT
SOLUTION ELECTROLUBE (MISCELLANEOUS) ×3 IMPLANT
SPONGE T-LAP 18X18 ~~LOC~~+RFID (SPONGE) ×3 IMPLANT
STAPLER CANNULA SEAL DVNC XI (STAPLE) ×2 IMPLANT
STAPLER CANNULA SEAL XI (STAPLE) ×1
SUT MNCRL AB 4-0 PS2 18 (SUTURE) ×3 IMPLANT
SUT VIC AB 2-0 SH 27 (SUTURE) ×6
SUT VIC AB 2-0 SH 27XBRD (SUTURE) ×4 IMPLANT
SUT VICRYL 0 AB UR-6 (SUTURE) ×6 IMPLANT
SUT VLOC 90 S/L VL9 GS22 (SUTURE) ×3 IMPLANT
TAPE TRANSPORE STRL 2 31045 (GAUZE/BANDAGES/DRESSINGS) ×3 IMPLANT
TRAY FOLEY SLVR 16FR LF STAT (SET/KITS/TRAYS/PACK) ×3 IMPLANT
TROCAR BALLN GELPORT 12X130M (ENDOMECHANICALS) ×3 IMPLANT
WATER STERILE IRR 500ML POUR (IV SOLUTION) IMPLANT

## 2020-11-06 NOTE — Discharge Instructions (Signed)
AMBULATORY SURGERY  DISCHARGE INSTRUCTIONS   The drugs that you were given will stay in your system until tomorrow so for the next 24 hours you should not:  Drive an automobile Make any legal decisions Drink any alcoholic beverage   You may resume regular meals tomorrow.  Today it is better to start with liquids and gradually work up to solid foods.  You may eat anything you prefer, but it is better to start with liquids, then soup and crackers, and gradually work up to solid foods.   Please notify your doctor immediately if you have any unusual bleeding, trouble breathing, redness and pain at the surgery site, drainage, fever, or pain not relieved by medication.    Additional Instructions:     Please contact your physician with any problems or Same Day Surgery at [phone], Monday through Friday 6 am to 4 pm, or [place] at [facility] number at [phone].             CIRUGIA AMBULATORIA             Instruccionnes de alta    1.  Las drogas que se Statistician en su cuerpo [employer], asi que por las proximas 24 horas usted no debe:   Conducir Scientist, research (medical)) un automovil   Hacer ninguna decision legal   Tomar ninguna bebida alcoholica  2.  A) [place] puede comenzar una dieta regular.  Es mejor que hoy empiece con liquidos y gradualmente anada comidas solidas.       B) Puede comer cualquier comida que desee pero es mejor empezar con liquidos, luego sopitas con galletas saladas y gradualmente llegar a las comidas solidas.  3.  Por favor avise a su medico inmediatamente si usted tiene algun sangrado anormal, tiene dificultad con la respiracion, enrojecimiento y Social research officer, government en el sitio de la cirugia, [place], fiebro o dolor que se alivia con [place].  4.  A) Su visita posoperatoria (despues de su operacion) es con el  Dr. Avelina Laine: Monday 11/19/2020                 Hora: 2:15 pm        B)  Por favor llame para hacer la cita posoperatoria.  5.   Istrucciones especificas :

## 2020-11-06 NOTE — Anesthesia Preprocedure Evaluation (Addendum)
Anesthesia Evaluation  Patient identified by MRN, date of birth, ID band Patient awake    Reviewed: Allergy & Precautions, NPO status , Patient's Chart, lab work & pertinent test results  History of Anesthesia Complications Negative for: history of anesthetic complications  Airway Mallampati: II  TM Distance: >3 FB Neck ROM: Full    Dental no notable dental hx.    Pulmonary neg pulmonary ROS, neg sleep apnea, neg COPD,    breath sounds clear to auscultation- rhonchi (-) wheezing      Cardiovascular hypertension, Pt. on medications (-) angina+ CAD, + Past MI, + Cardiac Stents (2018) and +CHF   Rhythm:Regular Rate:Normal - Systolic murmurs and - Diastolic murmurs L heart cath 11/20/17: Previously placed Prox LAD drug eluting stent is widely patent. Balloon angioplasty was performed. Ost 1st Diag lesion is 20% stenosed. Prox RCA lesion is 30% stenosed. There is mild left ventricular systolic dysfunction. LV end diastolic pressure is mildly elevated. The left ventricular ejection fraction is 45-50% by visual estimate.  Echo 05/29/17: - Left ventricle: The cavity size was normal. Systolic function was  mildly to moderately reduced. The estimated ejection fraction was  in the range of 40% to 45%. Hypokinesis of the anterior  myocardium. Hypokinesis of the anteroseptal myocardium.  Hypokinesis of the apical myocardium. Doppler parameters are  consistent with abnormal left ventricular relaxation (grade 1  diastolic dysfunction).  - Mitral valve: There was mild regurgitation.  - Left atrium: The atrium was normal in size.  - Right ventricle: Systolic function was normal.  - Pulmonary arteries: Systolic pressure was within the normal  range.    Neuro/Psych neg Seizures negative neurological ROS  negative psych ROS   GI/Hepatic negative GI ROS, Neg liver ROS,   Endo/Other  diabetes, Oral Hypoglycemic Agents   Renal/GU negative Renal ROS     Musculoskeletal negative musculoskeletal ROS (+)   Abdominal (+) - obese,   Peds  Hematology negative hematology ROS (+)   Anesthesia Other Findings Past Medical History: No date: Acute ST elevation myocardial infarction (STEMI) involving  left anterior descending (LAD) coronary artery ([place])     Comment:  a. 01/2017 Ant STEMI/PCI: LAD 20ost, 100p (3.0x23 Xience              Sierra DES). No date: CAD (coronary artery disease)     Comment:  a. 01/2017 Ant STEMI/PCI: LM 20d, LAD 20ost, 100p               (3.0x23 Xience Sierra DES), 98m D1 50ost, LCX nl, RCA               30p; b. 11/2017 MV: Intermediate risk w/ 144minf ST dep;               c. 11/2017 Cath: LM nl, LAD patent prox stent, D1 20ost,               LCX nl, RCA 30p. EF 45-50%. No date: Chronic anticoagulation     Comment:  DAPT (ASA + clopidogrel) No date: Chronic combined systolic (congestive) and diastolic  (congestive) heart failure ([place]    Comment:  a. 01/2018 Echo: EF 30-35%, sev antsept, ant, apical HK.              Gr1 DD, mild MR. No date: Essential hypertension No date: Hyperlipidemia     Comment:  a. Myalgias with high dose lipitor - tolerating '40mg'$   daily. No date: Ischemic cardiomyopathy     Comment:  a. 01/2018 Echo: EF 30-35%. No date: Type II diabetes mellitus (H[facility])   Reproductive/Obstetrics                            Anesthesia Physical Anesthesia Plan  ASA: 3  Anesthesia Plan: General   Post-op Pain Management:    Induction: Intravenous  PONV Risk Score and Plan: 1 and Ondansetron and Dexamethasone  Airway Management Planned: Oral ETT  Additional Equipment:   Intra-op Plan:   Post-operative Plan: Extubation in OR  Informed Consent: I have reviewed the patients History and Physical, chart, labs and discussed the procedure including the risks, benefits and alternatives for the proposed anesthesia with the patient  or authorized representative who has indicated his/her understanding and acceptance.     Dental advisory given  Plan Discussed with: CRNA and Anesthesiologist  Anesthesia Plan Comments:         Anesthesia Quick Evaluation

## 2020-11-06 NOTE — Op Note (Addendum)
Procedure Date:  11/06/2020  Pre-operative Diagnosis:  Right inguinal hernia  Post-operative Diagnosis: Right inguinal hernia containing bladder and a large cord lipoma  Procedure: 1.  Robotic assisted Right Inguinal Hernia Repair 2.  Creation of Right Posterior Rectus-Transversalis Fascia Advancment Flap for Coverage of Pelvic Wound (200 cm)  Surgeon:  Melvyn Neth, MD  Anesthesia:  General endotracheal  Estimated Blood Loss:  25 ml  Specimens:  None  Complications:  None  Indications for Procedure:  This is a 65 y.o. male who presents with a right inguinal hernia.  The options of surgery versus observation were reviewed with the patient and/or family. The risks of bleeding, abscess or infection, recurrence of symptoms, potential for an open procedure, injury to surrounding structures, and chronic pain were all discussed with the patient and he was willing to proceed.  We have planned this transabdominal procedure with the creation of right peritoneal flap based on the posterior rectus sheath and transversalis fascia in order to fully cover the mesh, creating a natural tisssue barrier for the bowel and peritoneal cavity.  Description of Procedure: The patient was correctly identified in the preoperative area and brought into the operating room.  The patient was placed supine with VTE prophylaxis in place.  Appropriate time-outs were performed.  Anesthesia was induced and the patient was intubated.  Foley catheter was placed.  Appropriate antibiotics were infused.  The abdomen was prepped and draped in a sterile fashion. A supraumbilical incision was made. A cutdown technique was used to enter the abdominal cavity without injury, and a Hasson trocar was inserted.  Pneumoperitoneum was obtained with appropriate opening pressures.  A Veress needle was used to start dissecting the peritoneal flap.  Two 8-mm robotic ports were placed in the right and left lateral positions under direct  visualization.  A right large 3D Max Mid Bard Mesh, a 2-0 Vicryl, and 2-0 vloc suture were placed through the umbilical port under direct visualization.  The [employer] platform was docked onto the patient, the camera was inserted and targeted, and the instruments were placed under direct visualization.  Both inguinal regions were inspected for hernias and it was confirmed that the patient had a right inguinal hernia, containing bladder.  Using electocautery, the peritoneal and posterior rectus tissue flap was created.  The peritoneum on the right side was scored from the median umbilical ligament laterally towards the ASIS.  The flap was mobilized using robotic scissors and the bipolar instruments, creating a plane along the posterior rectus sheath and transversalis fascia down to the pubic tubercle medially. It was then further mobilized laterally across the inguinal canal and femoral vessels and onto the psoas muscle. The inferior epigastric vessels were identified and preserved. This created a posterior rectus and peritoneal flap measuring roughly 17 cm x 12 cm.  The hernia sac and contents were reduced preserving all structures.  The patient had a large indirect inguinal hernia containing a large cord lipoma and part of the bladder.  The adhesions between the bladder and hernia sac were thick and extensive, which required further dissection.  The cord lipoma was very large but its adhesions were freed and it was resected.  The bladder was fully reduced preserving all structures.  A right large Bard 3D Max Mid mesh was placed with good overlap along all the potential hernia defects and secured in place with 2-0 Vicryl along the medial superomedial and superolateral aspects.  Then, the peritoneal flap was advanced over the mesh and carried  over to close the defect. A running 2 O V lock suture was used to approximate the edge of the flap onto the peritoneum.  All needles were removed under direct visualization.   The 8- mm ports were removed under direct visualization and the Hasson trocar was removed.  The fascial opening was closed using 0 vicryl suture.  Local anesthetic was infused in all incisions as well as a right ilioinguinal block, and the incisions were closed with 4-0 Monocryl.  The wounds were cleaned and sealed with DermaBond.  Foley catheter was removed and the patient was emerged from anesthesia and extubated and brought to the recovery room for further management.  The patient tolerated the procedure well and all counts were correct at the end of the case.   Melvyn Neth, MD

## 2020-11-06 NOTE — Interval H&P Note (Signed)
History and Physical Interval Note:  11/06/2020 8:05 AM  Lucas [name]  has presented today for surgery, with the diagnosis of right inguinal hernia.  The various methods of treatment have been discussed with the patient and family. After consideration of risks, benefits and other options for treatment, the patient has consented to  Procedure(s): XI ROBOTIC [place] (Right) as a surgical intervention.  The patient's history has been reviewed, patient examined, no change in status, stable for surgery.  I have reviewed the patient's chart and labs.  Questions were answered to the patient's satisfaction.     Leonard Feigel

## 2020-11-06 NOTE — Anesthesia Procedure Notes (Signed)
Procedure Name: Intubation Date/Time: 11/06/2020 8:34 AM Performed by: Aline Brochure, CRNA Pre-anesthesia Checklist: Patient identified, Patient being monitored, Timeout performed, Emergency Drugs available and Suction available Patient Re-evaluated:Patient Re-evaluated prior to induction Oxygen Delivery Method: Circle system utilized Preoxygenation: Pre-oxygenation with 100% oxygen Induction Type: IV induction Ventilation: Mask ventilation without difficulty Laryngoscope Size: McGraph and 4 Grade View: Grade I Tube type: Oral Tube size: 7.0 mm Number of attempts: 1 Airway Equipment and Method: Stylet Placement Confirmation: ETT inserted through vocal cords under direct vision, positive ETCO2 and breath sounds checked- equal and bilateral Secured at: 21 cm Tube secured with: Tape Dental Injury: Teeth and Oropharynx as per pre-operative assessment

## 2020-11-06 NOTE — Transfer of Care (Signed)
Immediate Anesthesia Transfer of Care Note  Patient: Lucas [name]  Procedure(s) Performed: XI ROBOTIC ASSISTED INGUINAL HERNIA (Right: Inguinal)  Patient Location: PACU  Anesthesia Type:General  Level of Consciousness: sedated  Airway & Oxygen Therapy: Patient Spontanous Breathing and Patient connected to face mask oxygen  Post-op Assessment: Report given to RN and Post -op Vital signs reviewed and stable  Post vital signs: Reviewed and stable  Last Vitals:  Vitals Value Taken Time  BP 115/71   Temp    Pulse 72 11/06/20 1139  Resp 10   SpO2 99 % 11/06/20 1139  Vitals shown include unvalidated device data.  Last Pain:  Vitals:   11/06/20 0801  TempSrc: Temporal  PainSc: 0-No pain         Complications: No notable events documented.

## 2020-11-07 ENCOUNTER — Telehealth: Payer: Self-pay | Admitting: *Deleted

## 2020-11-07 ENCOUNTER — Other Ambulatory Visit: Payer: Self-pay

## 2020-11-07 NOTE — Telephone Encounter (Signed)
Patient called and stated that he needs a work note stating that he had surgery and can not return to work until he is cleared by Dr Hampton Abbot on his post op appointment on 11/19/20.   He had surgery yesterday by Dr.Piscoya right inguinal hernia repair  Patient would like to pick up note

## 2020-11-07 NOTE — Telephone Encounter (Signed)
Spoke with the patient's son, [name], and he will pick up the note today at the front office.

## 2020-11-08 ENCOUNTER — Encounter: Payer: Self-pay | Admitting: Surgery

## 2020-11-11 NOTE — Anesthesia Postprocedure Evaluation (Signed)
Anesthesia Post Note  Patient: Lucas [name]  Procedure(s) Performed: XI ROBOTIC ASSISTED INGUINAL HERNIA REPAIR WITH MESH (Right: Inguinal)  Patient location during evaluation: PACU Anesthesia Type: General Level of consciousness: awake and alert Pain management: pain level controlled Vital Signs Assessment: post-procedure vital signs reviewed and stable Respiratory status: spontaneous breathing, nonlabored ventilation, respiratory function stable and patient connected to nasal cannula oxygen Cardiovascular status: blood pressure returned to baseline and stable Postop Assessment: no apparent nausea or vomiting Anesthetic complications: no   No notable events documented.   Last Vitals:  Vitals:   11/06/20 1300 11/06/20 1316  BP: 131/87 137/77  Pulse: 76 86  Resp: 12 18  Temp: (!) 36.3 C (!) 35.9 C  SpO2: 97% 99%    Last Pain:  Vitals:   11/06/20 1316  TempSrc: Temporal  PainSc: 2                  Molli Barrows

## 2020-11-19 ENCOUNTER — Ambulatory Visit (INDEPENDENT_AMBULATORY_CARE_PROVIDER_SITE_OTHER): Payer: Self-pay | Admitting: Surgery

## 2020-11-19 ENCOUNTER — Encounter: Payer: Self-pay | Admitting: Surgery

## 2020-11-19 ENCOUNTER — Other Ambulatory Visit: Payer: Self-pay

## 2020-11-19 VITALS — Ht 60.0 in

## 2020-11-19 DIAGNOSIS — Z09 Encounter for follow-up examination after completed treatment for conditions other than malignant neoplasm: Secondary | ICD-10-CM

## 2020-11-19 DIAGNOSIS — K4090 Unilateral inguinal hernia, without obstruction or gangrene, not specified as recurrent: Secondary | ICD-10-CM

## 2020-11-19 NOTE — Patient Instructions (Signed)
GENERAL POST-OPERATIVE PATIENT INSTRUCTIONS   WOUND CARE INSTRUCTIONS: Try to keep the wound dry and avoid ointments on the wound unless directed to do so.  If the wound becomes bright red and painful or starts to drain infected material that is not clear, please contact your physician immediately.  If the wound is mildly pink and has a thick firm ridge underneath it, this is normal, and is referred to as a healing ridge.  This will resolve over the next 4-6 weeks.  BATHING: You may shower if you have been informed of this by your surgeon. However, Please do not submerge in a tub, hot tub, or pool until incisions are completely sealed or have been told by your surgeon that you may do so.  DIET:  You may eat any foods that you can tolerate.  It is a good idea to eat a high fiber diet and take in plenty of fluids to prevent constipation.  If you do become constipated you may want to take a mild laxative or take ducolax tablets on a daily basis until your bowel habits are regular.  Constipation can be very uncomfortable, along with straining, after recent surgery.  ACTIVITY: You are encouraged to walk and engage in light activity for the next two weeks.  You should not lift more than 20 pounds for 6 weeks after surgery as it could put you at increased risk for complications.  Twenty pounds is roughly equivalent to a plastic bag of groceries. At that time- Listen to your body when lifting, if you have pain when lifting, stop and then try again in a few days. Soreness after doing exercises or activities of daily living is normal as you get back in to your normal routine.  MEDICATIONS:  Try to take narcotic medications and anti-inflammatory medications, such as tylenol, ibuprofen, naprosyn, etc., with food.  This will minimize stomach upset from the medication.  Should you develop nausea and vomiting from the pain medication, or develop a rash, please discontinue the medication and contact your physician.  You  should not drive, make important decisions, or operate machinery when taking narcotic pain medication.  SUNBLOCK Use sun block to incision area over the next year if this area will be exposed to sun. This helps decrease scarring and will allow you avoid a permanent darkened area over your incision.  QUESTIONS:  Please feel free to call our office if you have any questions, and we will be glad to assist you.

## 2020-11-19 NOTE — Progress Notes (Signed)
11/19/2020  HPI: Lucas [name] is a 65 y.o. male s/p robotic assisted right inguinal hernia repair on 11/06/20.  Patient presents for follow up.  Reports that he's been doing well.  Endorses soreness at the incisions and also feels that there's some remnant swelling in the right groin.  Denies any new bulging.  Vital signs: Ht 5' (1.524 m)   BMI 28.12 kg/m    Physical Exam: Constitutional:  No acute distress Abdomen:  soft, non-distended, with some soreness to palpation over the right groin and the umbilical incision.  Incisions are clean, dry, intact, without evidence of infection.  Right groin without any evidence of hernia recurrence, with some minimal swelling in the groin area.  Right scrotum appears normal.  Assessment/Plan: This is a 65 y.o. male s/p robotic assisted right inguinal hernia repair.  --Discussed with the patient that his hernia contained part of the bladder as well as a large cord lipoma.  This can explain why he has some residual swelling in the groin area, but this will continue to improve with time.  No issues with the incisions, and no evidence of recurrence. --Reminded him of no heavy lifting or pushing of no more than 10-15 lbs for a total period of 6 weeks.  May resume normal work duties after that. --Follow up as needed   Melvyn Neth, Lucas [name]

## 2020-11-27 ENCOUNTER — Encounter: Payer: Self-pay | Admitting: Gerontology

## 2020-11-27 ENCOUNTER — Ambulatory Visit: Payer: Self-pay | Admitting: Gerontology

## 2020-11-27 ENCOUNTER — Other Ambulatory Visit: Payer: Self-pay

## 2020-11-27 VITALS — BP 121/78 | HR 85 | Temp 98.2°F | Resp 16 | Ht 60.0 in | Wt 151.0 lb

## 2020-11-27 DIAGNOSIS — R7303 Prediabetes: Secondary | ICD-10-CM

## 2020-11-27 DIAGNOSIS — I10 Essential (primary) hypertension: Secondary | ICD-10-CM

## 2020-11-27 LAB — POCT GLYCOSYLATED HEMOGLOBIN (HGB A1C): Hemoglobin A1C: 5.9 % — AB (ref 4.0–5.6)

## 2020-11-27 LAB — GLUCOSE, POCT (MANUAL RESULT ENTRY): POC Glucose: 121 mg/dl — AB (ref 70–99)

## 2020-11-27 MED ORDER — METFORMIN HCL 500 MG PO TABS
ORAL_TABLET | ORAL | 2 refills | Status: DC
  Filled 2020-11-27: qty 30, fill #0

## 2020-11-27 NOTE — Patient Instructions (Signed)
Plan de alimentacin DASH DASH Eating Plan DASH es la sigla en ingls de "Enfoques Alimentarios para Detener la Hipertensin". El plan de alimentacin DASH ha demostrado: [place]ajar la presin arterial elevada (hipertensin). Reducir el riesgo de diabetes tipo 2, enfermedad cardaca y accidente cerebrovascular. Ayudar a perder peso. Consejos para seguir este plan Leer las etiquetas de los alimentos Verifique la cantidad de sal (sodio) por porcin en las etiquetas de los alimentos. Elija alimentos con menos del 5 por ciento del valor diario de sodio. Generalmente, los alimentos con menos de 300 miligramos (mg) de sodio por porcin se encuadran dentro de este plan alimentario. Para encontrar cereales integrales, busque la palabra "integral" como primera palabra en la lista de ingredientes. Al ir de compras Compre productos en los que en su etiqueta diga: "bajo contenido de sodio" o "sin agregado de sal". Compre alimentos frescos. Evite los alimentos enlatados y comidas precocidas o congeladas. Al cocinar Evite agregar sal cuando cocine. Use hierbas o aderezos sin sal, en lugar de sal de mesa o sal marina. Consulte al mdico o farmacutico antes de usar sustitutos de la sal. No fra los alimentos. A la hora de cocinar los alimentos opte por hornearlos, hervirlos, grillarlos, asarlos al horno y asarlos a la parrilla. Cocine con aceites cardiosaludables, como oliva, canola, aguacate, soja o girasol. Planificacin de las comidas  Consuma una dieta equilibrada, que incluya lo siguiente: 4 o ms porciones de frutas y 4 o ms porciones de verduras por da. Trate de que medio plato de cada comida sea de frutas y verduras. De 6 a 8 porciones de cereales integrales todos los das. Menos de 6 onzas (170 g) de carne, aves o pescado magros por da. Una porcin de 3 onzas (85 g) de carne tiene casi el mismo tamao que un mazo de cartas. Un huevo equivale a 1 onza (28 g). De 2 a 3 porciones de productos lcteos  descremados por da. Una porcin es 1 taza (237 ml). 1 porcin de frutos secos, semillas o frijoles 5 veces por semana. De 2 a 3 porciones de grasas cardiosaludables. Las grasas saludables llamadas cidos grasos omega-3 se encuentran en alimentos como las nueces, las semillas de lino, las leches fortificadas y los huevos. Estas grasas tambin se encuentran en los pescados de agua fra, como la sardina, el salmn y la caballa. Limite la cantidad que consume de: Alimentos enlatados o envasados. Alimentos con alto contenido de grasa trans, como algunos alimentos fritos. Alimentos con alto contenido de grasa saturada, como carne con grasa. Postres y otros dulces, bebidas azucaradas y otros alimentos con azcar agregada. Productos lcteos enteros. No le agregue sal a los alimentos antes de probarlos. No coma ms de 4 yemas de huevo por semana. Trate de comer al menos 2 comidas vegetarianas por semana. Consuma ms comida casera y menos de restaurante, de bares y comida rpida. Estilo de vida Cuando coma en un restaurante, pida que preparen su comida con menos sal o, en lo posible, sin nada de sal. Si bebe alcohol: Limite la cantidad que bebe: De 0 a 1 medida por da para las mujeres que no estn embarazadas. De 0 a 2 medidas por da para los hombres. Est atento a la cantidad de alcohol que hay en las bebidas que toma. En los [place], una medida equivale a una botella de cerveza de 12 oz (355 ml), un vaso de vino de 5 oz (148 ml) o un vaso de una bebida alcohlica de alta graduacin de 1 oz (  44 ml). Informacin general Evite ingerir ms de 2300 mg de sal por da. Si tiene hipertensin, es posible que necesite reducir la ingesta de sodio a 1,500 mg por da. Trabaje con su mdico para mantener un peso saludable o perder peso. Pregntele cul es el peso recomendado para usted. Realice al menos 30 minutos de ejercicio que haga que se acelere su corazn (ejercicio aerbico) la mayora de los das  de la semana. Estas actividades pueden incluir caminar, nadar o andar en bicicleta. Trabaje con su mdico o nutricionista para ajustar su plan alimentario a sus necesidades calricas personales. Qu alimentos debo comer? Frutas Todas las frutas frescas, congeladas o disecadas. Frutas enlatadas en jugo natural (sin agregado de azcar). Verduras Verduras frescas o congeladas (crudas, al vapor, asadas o grilladas). Jugos de tomate y verduras con bajo contenido de sodio o reducidos en sodio. Salsa y pasta de tomate con bajo contenido de sodio o reducidas en sodio. Verduras enlatadas con bajo contenido de sodio o reducidas en sodio. Granos Pan de salvado o integral. Pasta de salvado o integral. Arroz integral. Avena. Quinua. Trigo burgol. Cereales integrales y con bajo contenido de sodio. Pan pita. Galletitas de agua con bajo contenido de grasa y sodio. Tortillas de harina integral. Carnes y otras protenas Pollo o pavo sin piel. Carne de pollo o de pavo molida. Cerdo desgrasado. Pescado y mariscos. Claras de huevo. Porotos, guisantes o lentejas secos. Frutos secos, mantequilla de frutos secos y semillas sin sal. Frijoles enlatados sin sal. Cortes de carne vacuna magra, desgrasada. Carne precocida o curada magra y baja en sodio, como embutidos o panes de carne. Lcteos Leche descremada (1 %) o descremada. Quesos reducidos en grasa, con bajo contenido de grasa o descremados. Queso blanco o ricota sin grasa, con bajo contenido de sodio. Yogur semidescremado o descremado. Queso con bajo contenido de grasa y sodio. Grasas y aceites Margarinas untables que no contengan grasas trans. Aceite vegetal. [place] y aderezos para ensaladas livianos, reducidos en grasa o con bajo contenido de grasas (reducidos en sodio). Aceite de canola, crtamo, oliva, aguacate, soja y girasol. [place]. Alios y condimentos Hierbas. Especias. Mezclas de condimentos sin sal. Otros alimentos Palomitas de maz y pretzels sin sal.  Dulces con bajo contenido de grasas. Es posible que los productos que se enumeran ms arriba no constituyan una lista completa de los alimentos y las bebidas que puede tomar. Consulte a un nutricionista para obtener ms informacin. Qu alimentos debo evitar? Frutas Fruta enlatada en almbar liviano o espeso. Frutas cocidas en aceite. Frutas con salsa de crema o mantequilla. Verduras Verduras con crema o fritas. Verduras en salsa de queso. Verduras enlatadas regulares (que no sean con bajo contenido de sodio o reducidas en sodio). Pasta y salsa de tomates enlatadas regulares (que no sean con bajo contenido de sodio o reducidas en sodio). Jugos de tomate y verduras regulares (que no sean con bajo contenido de sodio o reducidos en sodio). Pepinillos. Aceitunas. Granos Productos de panificacin hechos con grasa, como medialunas, magdalenas y algunos panes. Comidas con arroz o pasta seca listas para usar. Carnes y otras protenas Cortes de carne con alto contenido de grasa. Costillas. Carne frita. [place]. [place], salame y otras carnes precocidas o curadas, como embutidos o panes de carne. Grasa de la espalda del cerdo (panceta). Salchicha de cerdo. Frutos secos y semillas con sal. Frijoles enlatados con agregado de sal. Pescado enlatado o ahumado. Huevos enteros o yemas. Pollo o pavo con piel. Lcteos Leche entera o al 2 %, crema   y [place] y mitad crema. Queso crema entero o con toda su grasa. Yogur entero o endulzado. Quesos con toda su grasa. Sustitutos de cremas no lcteas. Coberturas batidas. Quesos para untar y quesos procesados. Grasas y [employer]. Margarina en barra. [place]. Lardo. Mantequilla clarificada. Grasa de panceta. Aceites tropicales como aceite de coco, palmiste o palma. Alios y condimentos Sal de cebolla, sal de ajo, sal condimentada, sal de mesa y sal marina. Salsa Worcester[place]. Salsa trtara. Salsa barbacoa. Salsa teriyaki. Salsa de soja, incluso la que  tiene contenido reducido de [place]. Salsa de carne. Salsas en lata y envasadas. Salsa de pescado. Salsa de [place]. Salsa rosada. Rbanos picantes comprados en tiendas. [place]. Mostaza. Saborizantes y tiernizantes para carne. Caldo en cubitos. Salsas picantes. Adobos preelaborados o envasados. Aderezos para tacos preelaborados o envasados. Salsas de pepinillos. Aderezos comunes para ensalada. Otros alimentos Palomitas de maz y pretzels con sal. Es posible que los productos que se enumeran ms arriba no constituyan una lista completa de los alimentos y las bebidas que Nurse, adult. Consulte a un nutricionista para obtener ms informacin. Dnde buscar ms informacin National Heart, Lung, and Blood Institute ([place], los Pulmones y Herbalist): https://wilson-eaton.com/ American Heart Association (Asociacin [place]ense del [place]): www.heart.org Academy of Nutrition and Dietetics (Academia de Nutricin y Information systems manager): www.eatright.[place] ([place]): www.kidney.org Resumen El plan de alimentacin DASH ha demostrado bajar la presin arterial elevada (hipertensin). Tambin puede reducir [employer] de diabetes tipo 2, enfermedad cardaca y accidente cerebrovascular. Cuando siga el plan de alimentacin DASH, trate de comer ms frutas frescas y verduras, cereales integrales, carnes magras, lcteos descremados y grasas cardiosaludables. Con el plan de alimentacin DASH, deber limitar el consumo de sal (sodio) a 2,300 mg por da. Si tiene hipertensin, es posible que necesite reducir la ingesta de sodio a 1,500 mg por da. Trabaje con su mdico o nutricionista para ajustar su plan alimentario a sus necesidades calricas personales. Esta informacin no tiene Marine scientist el consejo del mdico. Asegrese de hacerle al mdico cualquier pregunta que tenga. Document Revised: 03/24/2019 Document Reviewed: 03/24/2019 Elsevier Patient Education   2022 [employer].

## 2020-11-27 NOTE — Progress Notes (Signed)
Established Patient Office Visit  Subjective:  Patient ID: Lucas [name], male    DOB: Mar 11, 1955  Age: 65 y.o. MRN: [id number]  CC:  Chief Complaint  Patient presents with   Follow-up   Hypertension    Patient does not check his blood pressures at home, but does states that he is taking his HTN meds as directed.    HPI Lucas [name]  is a 64 y/o male who has history of CAD, STEMI, CHF, presents for routine follow up visit and medication refill. He states that he's compliant with his medications and continues to make healthy life style changes. He had  Hernia repair procedure on 11/06/20 by Dr Hampton Abbot, no erythema, discharge or tenderness to 3 abdominal lap sites with dermabond dressing. His HgbA1c was checked and it was 5.9%. He denies chest pain, palpitation, dizziness, vision changes , hematuria, hematochezia and myalgia. Overall, he states that he's doing well and offers no further complaint.  Past Medical History:  Diagnosis Date   Acute ST elevation myocardial infarction (STEMI) involving left anterior descending (LAD) coronary artery ([place])    a. 01/2017 Ant STEMI/PCI: LAD 20ost, 100p (3.0x23 Xience [place]).   CAD (coronary artery disease)    a. 01/2017 Ant STEMI/PCI: LM 20d, LAD 20ost, 100p (3.0x23 Xience [place] DES), 62m, D1 50ost, LCX nl, RCA 30p; b. 11/2017 MV: Intermediate risk w/ 46mm inf ST dep; c. 11/2017 Cath: LM nl, LAD patent prox stent, D1 20ost, LCX nl, RCA 30p. EF 45-50%.   Chronic anticoagulation    DAPT (ASA + clopidogrel)   Chronic combined systolic (congestive) and diastolic (congestive) heart failure ([place])    a. 01/2018 Echo: EF 30-35%, sev antsept, ant, apical HK. Gr1 DD, mild MR.   Essential hypertension    Hyperlipidemia    a. Myalgias with high dose lipitor - tolerating 40mg  daily.   Ischemic cardiomyopathy    a. 01/2018 Echo: EF 30-35%.   Type II diabetes mellitus ([place])     Past Surgical History:  Procedure Laterality Date    APPENDECTOMY     CORONARY/GRAFT ACUTE MI REVASCULARIZATION N/A 01/25/2017   Procedure: Coronary/Graft Acute MI Revascularization;  Surgeon: Nelva Bush, MD;  Location: [facility];  Service: Cardiovascular;  Laterality: N/A;   LEFT HEART CATH AND CORONARY ANGIOGRAPHY N/A 01/25/2017   Procedure: LEFT HEART CATH AND CORONARY ANGIOGRAPHY;  Surgeon: Nelva Bush, MD;  Location: [facility];  Service: Cardiovascular;  Laterality: N/A;   LEFT HEART CATH AND CORONARY ANGIOGRAPHY N/A 11/20/2017   Procedure: LEFT HEART CATH AND CORONARY ANGIOGRAPHY;  Surgeon: Wellington Hampshire, MD;  Location: [facility];  Service: Cardiovascular;  Laterality: N/A;   XI ROBOTIC ASSISTED INGUINAL HERNIA REPAIR WITH MESH Right 11/06/2020   Procedure: XI ROBOTIC ASSISTED INGUINAL HERNIA REPAIR WITH MESH;  Surgeon: Olean Ree, MD;  Location: [facility];  Service: General;  Laterality: Right;    Family History  Problem Relation Age of Onset   Asthma Mother    Heart disease Father        s/p pacemaker   Arrhythmia Father    Asthma Father    Healthy Sister     Social History   Socioeconomic History   Marital status: Married    Spouse name: Not on file   Number of children: Not on file   Years of education: Not on file   Highest education level: Not on file  Occupational History   Occupation: unemployed  Tobacco Use  Smoking status: Never   Smokeless tobacco: Never  Vaping Use   Vaping Use: Never used  Substance and Sexual Activity   Alcohol use: No   Drug use: No   Sexual activity: Yes    Birth control/protection: Coitus interruptus, None  Other Topics Concern   Not on file  Social History Narrative   Not on file   Social Determinants of Health   Financial Resource Strain: Not on file  Food Insecurity: No Food Insecurity   Worried About Charity fundraiser in the Last Year: Never true   Ran Out of Food in the Last Year: Never true  Transportation Needs: No  Transportation Needs   Lack of Transportation (Medical): No   Lack of Transportation (Non-Medical): No  Physical Activity: Not on file  Stress: Not on file  Social Connections: Not on file  Intimate Partner Violence: Not on file    Outpatient Medications Prior to Visit  Medication Sig Dispense Refill   aspirin 81 MG chewable tablet TOMAR UNA TABLETA POR BOCA DIARIO 90 tablet 3   atorvastatin (LIPITOR) 10 MG tablet TOMAR UNA TABLETA POR BOCA DIARIO 90 tablet 3   carvedilol (COREG) 12.5 MG tablet TOMAR UNA TABLETA POR BOCA DOS VECES AL DIA 180 tablet 3   clopidogrel (PLAVIX) 75 MG tablet Take 1 tablet (75 mg total) by mouth daily. 90 tablet 3   sacubitril-valsartan (ENTRESTO) 24-26 MG Take 1 tablet by mouth 2 (two) times daily. 180 tablet 3   spironolactone (ALDACTONE) 25 MG tablet TOMAR MEDIO TABLETA(0.5 TABLETA) POR BOCA AL DIARIO 45 tablet 3   metFORMIN (GLUCOPHAGE) 500 MG tablet TOME UNA TABLETA TODOS LOS DIAS CON EL DESAYUNO 30 tablet 0   acetaminophen (TYLENOL) 500 MG tablet Take 2 tablets (1,000 mg total) by mouth every 6 (six) hours as needed for mild pain.     oxyCODONE (OXY IR/ROXICODONE) 5 MG immediate release tablet Take 1 tablet (5 mg total) by mouth every 4 (four) hours as needed for severe pain. 30 tablet 0   No facility-administered medications prior to visit.    No Known Allergies  ROS Review of Systems  Constitutional: Negative.   HENT: Negative.    Eyes: Negative.   Respiratory: Negative.    Cardiovascular: Negative.   Endocrine: Negative.   Neurological: Negative.   Hematological: Negative.   Psychiatric/Behavioral: Negative.       Objective:    Physical Exam HENT:     Head: Normocephalic and atraumatic.     Mouth/Throat:     Mouth: Mucous membranes are moist.  Eyes:     Extraocular Movements: Extraocular movements intact.     Conjunctiva/sclera: Conjunctivae normal.     Pupils: Pupils are equal, round, and reactive to light.  Cardiovascular:      Rate and Rhythm: Normal rate and regular rhythm.     Pulses: Normal pulses.     Heart sounds: Normal heart sounds.  Abdominal:     General: Bowel sounds are normal.     Palpations: Abdomen is soft.  Skin:    General: Skin is warm.  Neurological:     General: No focal deficit present.     Mental Status: He is alert and oriented to person, place, and time. Mental status is at baseline.  Psychiatric:        Mood and Affect: Mood normal.        Behavior: Behavior normal.        Judgment: Judgment normal.    BP 121/78 (  BP Location: Left Arm, Patient Position: Sitting, Cuff Size: Normal)   Pulse 85   Temp 98.2 F (36.8 C)   Resp 16   Ht 5' (1.524 m)   Wt 151 lb (68.5 kg)   SpO2 96%   BMI 29.49 kg/m  Wt Readings from Last 3 Encounters:  11/27/20 151 lb (68.5 kg)  11/06/20 144 lb (65.3 kg)  10/25/20 144 lb (65.3 kg)     Health Maintenance Due  Topic Date Due   OPHTHALMOLOGY EXAM  Never done   Hepatitis C Screening  Never done   TETANUS/TDAP  Never done   Zoster Vaccines- Shingrix (1 of 2) Never done   COLONOSCOPY (Pts 45-76yrs Insurance coverage will need to be confirmed)  Never done   COVID-19 Vaccine (3 - Moderna risk series) 07/12/2019   FOOT EXAM  05/10/2020   INFLUENZA VACCINE  10/01/2020    There are no preventive care reminders to display for this patient.  Lab Results  Component Value Date   TSH 3.160 08/19/2018   Lab Results  Component Value Date   WBC 6.3 10/16/2020   HGB 16.1 10/16/2020   HCT 45.0 10/16/2020   MCV 91.6 10/16/2020   PLT 190 10/16/2020   Lab Results  Component Value Date   NA 137 10/16/2020   K 3.8 10/16/2020   CO2 26 10/16/2020   GLUCOSE 133 (H) 10/16/2020   BUN 10 10/16/2020   CREATININE 0.81 10/16/2020   BILITOT 0.8 10/16/2020   ALKPHOS 48 10/16/2020   AST 18 10/16/2020   ALT 17 10/16/2020   PROT 7.0 10/16/2020   ALBUMIN 4.0 10/16/2020   CALCIUM 8.9 10/16/2020   ANIONGAP 9 10/16/2020   Lab Results  Component Value Date    CHOL 145 11/02/2019   Lab Results  Component Value Date   HDL 30 (L) 11/02/2019   Lab Results  Component Value Date   LDLCALC 79 11/02/2019   Lab Results  Component Value Date   TRIG 215 (H) 11/02/2019   Lab Results  Component Value Date   CHOLHDL 4.8 11/02/2019   Lab Results  Component Value Date   HGBA1C 5.9 (A) 11/27/2020      Assessment & Plan:    1. Prediabetes - His HgbA1c was 5.9%, he will continue on Metformin 500 mg daily, low carb/non concentrated sweet diet and exercise as tolerated. - POCT HgB A1C; Future - POCT Glucose (CBG); Future - POCT Glucose (CBG) - POCT HgB A1C - metFORMIN (GLUCOPHAGE) 500 MG tablet; TOME UNA TABLETA TODOS LOS DIAS CON EL DESAYUNO  Dispense: 30 tablet; Refill: 2  2. Essential hypertension - His blood pressure is under control, he will continue on current medication, DASH diet and exercise as tolerated.     Follow-up: Return in about 6 weeks (around 01/08/2021), or if symptoms worsen or fail to improve.    Shenica Holzheimer Jerold Coombe, NP

## 2020-12-16 ENCOUNTER — Emergency Department: Payer: Self-pay

## 2020-12-16 ENCOUNTER — Observation Stay: Payer: Self-pay

## 2020-12-16 ENCOUNTER — Encounter: Payer: Self-pay | Admitting: Emergency Medicine

## 2020-12-16 ENCOUNTER — Other Ambulatory Visit: Payer: Self-pay

## 2020-12-16 ENCOUNTER — Inpatient Hospital Stay
Admission: EM | Admit: 2020-12-16 | Discharge: 2020-12-18 | DRG: 378 | Disposition: A | Discharge status: Home / Self Care | Payer: Self-pay | Attending: Hospitalist | Admitting: Hospitalist

## 2020-12-16 DIAGNOSIS — D62 Acute posthemorrhagic anemia: Secondary | ICD-10-CM | POA: Diagnosis present

## 2020-12-16 DIAGNOSIS — I252 Old myocardial infarction: Secondary | ICD-10-CM

## 2020-12-16 DIAGNOSIS — E785 Hyperlipidemia, unspecified: Secondary | ICD-10-CM | POA: Diagnosis present

## 2020-12-16 DIAGNOSIS — I2510 Atherosclerotic heart disease of native coronary artery without angina pectoris: Secondary | ICD-10-CM | POA: Diagnosis present

## 2020-12-16 DIAGNOSIS — Z955 Presence of coronary angioplasty implant and graft: Secondary | ICD-10-CM

## 2020-12-16 DIAGNOSIS — Z7902 Long term (current) use of antithrombotics/antiplatelets: Secondary | ICD-10-CM

## 2020-12-16 DIAGNOSIS — Z7901 Long term (current) use of anticoagulants: Secondary | ICD-10-CM

## 2020-12-16 DIAGNOSIS — K264 Chronic or unspecified duodenal ulcer with hemorrhage: Secondary | ICD-10-CM | POA: Diagnosis present

## 2020-12-16 DIAGNOSIS — I255 Ischemic cardiomyopathy: Secondary | ICD-10-CM | POA: Diagnosis present

## 2020-12-16 DIAGNOSIS — I5022 Chronic systolic (congestive) heart failure: Secondary | ICD-10-CM | POA: Diagnosis present

## 2020-12-16 DIAGNOSIS — I5042 Chronic combined systolic (congestive) and diastolic (congestive) heart failure: Secondary | ICD-10-CM | POA: Diagnosis present

## 2020-12-16 DIAGNOSIS — K640 First degree hemorrhoids: Secondary | ICD-10-CM | POA: Diagnosis present

## 2020-12-16 DIAGNOSIS — E119 Type 2 diabetes mellitus without complications: Secondary | ICD-10-CM | POA: Diagnosis present

## 2020-12-16 DIAGNOSIS — K5731 Diverticulosis of large intestine without perforation or abscess with bleeding: Principal | ICD-10-CM | POA: Diagnosis present

## 2020-12-16 DIAGNOSIS — I509 Heart failure, unspecified: Secondary | ICD-10-CM

## 2020-12-16 DIAGNOSIS — K5790 Diverticulosis of intestine, part unspecified, without perforation or abscess without bleeding: Secondary | ICD-10-CM

## 2020-12-16 DIAGNOSIS — Z7982 Long term (current) use of aspirin: Secondary | ICD-10-CM

## 2020-12-16 DIAGNOSIS — Z8249 Family history of ischemic heart disease and other diseases of the circulatory system: Secondary | ICD-10-CM

## 2020-12-16 DIAGNOSIS — Z79899 Other long term (current) drug therapy: Secondary | ICD-10-CM

## 2020-12-16 DIAGNOSIS — I10 Essential (primary) hypertension: Secondary | ICD-10-CM | POA: Diagnosis present

## 2020-12-16 DIAGNOSIS — K2970 Gastritis, unspecified, without bleeding: Secondary | ICD-10-CM | POA: Diagnosis present

## 2020-12-16 DIAGNOSIS — I110 Hypertensive heart disease with heart failure: Secondary | ICD-10-CM | POA: Diagnosis present

## 2020-12-16 DIAGNOSIS — Z825 Family history of asthma and other chronic lower respiratory diseases: Secondary | ICD-10-CM

## 2020-12-16 DIAGNOSIS — Z20822 Contact with and (suspected) exposure to covid-19: Secondary | ICD-10-CM | POA: Diagnosis present

## 2020-12-16 DIAGNOSIS — R7303 Prediabetes: Secondary | ICD-10-CM | POA: Diagnosis present

## 2020-12-16 DIAGNOSIS — K922 Gastrointestinal hemorrhage, unspecified: Secondary | ICD-10-CM | POA: Diagnosis present

## 2020-12-16 DIAGNOSIS — Z7984 Long term (current) use of oral hypoglycemic drugs: Secondary | ICD-10-CM

## 2020-12-16 LAB — CBC
HCT: 33.5 % — ABNORMAL LOW (ref 39.0–52.0)
Hemoglobin: 11.7 g/dL — ABNORMAL LOW (ref 13.0–17.0)
MCH: 32.4 pg (ref 26.0–34.0)
MCHC: 34.9 g/dL (ref 30.0–36.0)
MCV: 92.8 fL (ref 80.0–100.0)
Platelets: 196 10*3/uL (ref 150–400)
RBC: 3.61 MIL/uL — ABNORMAL LOW (ref 4.22–5.81)
RDW: 12.2 % (ref 11.5–15.5)
WBC: 5.8 10*3/uL (ref 4.0–10.5)
nRBC: 0 % (ref 0.0–0.2)

## 2020-12-16 LAB — COMPREHENSIVE METABOLIC PANEL
ALT: 18 U/L (ref 0–44)
AST: 19 U/L (ref 15–41)
Albumin: 4.2 g/dL (ref 3.5–5.0)
Alkaline Phosphatase: 48 U/L (ref 38–126)
Anion gap: 7 (ref 5–15)
BUN: 10 mg/dL (ref 8–23)
CO2: 30 mmol/L (ref 22–32)
Calcium: 9 mg/dL (ref 8.9–10.3)
Chloride: 101 mmol/L (ref 98–111)
Creatinine, Ser: 0.71 mg/dL (ref 0.61–1.24)
GFR, Estimated: >60 mL/min (ref >60)
Glucose, Bld: 269 mg/dL — ABNORMAL HIGH (ref 70–99)
Potassium: 3.8 mmol/L (ref 3.5–5.1)
Sodium: 138 mmol/L (ref 135–145)
Total Bilirubin: 0.4 mg/dL (ref 0.3–1.2)
Total Protein: 7 g/dL (ref 6.5–8.1)

## 2020-12-16 LAB — CBG MONITORING, ED: Glucose-Capillary: 183 mg/dL — ABNORMAL HIGH (ref 70–99)

## 2020-12-16 LAB — TYPE AND SCREEN
ABO/RH(D): O POS
Antibody Screen: NEGATIVE

## 2020-12-16 LAB — RESP PANEL BY RT-PCR (FLU A&B, COVID) ARPGX2
Influenza A by PCR: NEGATIVE
Influenza B by PCR: NEGATIVE
SARS Coronavirus 2 by RT PCR: NEGATIVE

## 2020-12-16 LAB — PROTIME-INR
INR: 1.1 (ref 0.8–1.2)
Prothrombin Time: 13.8 seconds (ref 11.4–15.2)

## 2020-12-16 LAB — GLUCOSE, CAPILLARY
Glucose-Capillary: 101 mg/dL — ABNORMAL HIGH (ref 70–99)
Glucose-Capillary: 144 mg/dL — ABNORMAL HIGH (ref 70–99)

## 2020-12-16 LAB — BRAIN NATRIURETIC PEPTIDE: B Natriuretic Peptide: 13.8 pg/mL (ref 0.0–100.0)

## 2020-12-16 LAB — HEMOGLOBIN AND HEMATOCRIT, BLOOD
HCT: 29.5 % — ABNORMAL LOW (ref 39.0–52.0)
Hemoglobin: 10.4 g/dL — ABNORMAL LOW (ref 13.0–17.0)

## 2020-12-16 LAB — HIV ANTIBODY (ROUTINE TESTING W REFLEX): HIV Screen 4th Generation wRfx: NONREACTIVE

## 2020-12-16 MED ORDER — CLOPIDOGREL BISULFATE 75 MG PO TABS: 75.0000 mg | ORAL_TABLET | Freq: Every day | ORAL | Status: DC

## 2020-12-16 MED ORDER — ACETAMINOPHEN 325 MG PO TABS: 650.0000 mg | ORAL_TABLET | Freq: Four times a day (QID) | ORAL | Status: DC | PRN

## 2020-12-16 MED ORDER — FENTANYL CITRATE PF 50 MCG/ML IJ SOSY
50.0000 ug | PREFILLED_SYRINGE | INTRAMUSCULAR | Status: DC | PRN
  Administered 2020-12-16: 50 ug via INTRAVENOUS
  Filled 2020-12-16: qty 1

## 2020-12-16 MED ORDER — ACETAMINOPHEN 650 MG RE SUPP
650.0000 mg | Freq: Four times a day (QID) | RECTAL | Status: DC | PRN
  Filled 2020-12-16: qty 1

## 2020-12-16 MED ORDER — INFLUENZA VAC SPLIT QUAD 0.5 ML IM SUSY
0.5000 mL | PREFILLED_SYRINGE | INTRAMUSCULAR | Status: DC
  Filled 2020-12-16: qty 0.5

## 2020-12-16 MED ORDER — IOHEXOL 350 MG/ML SOLN
100.0000 mL | Freq: Once | INTRAVENOUS | Status: AC | PRN
  Administered 2020-12-16: 100 mL via INTRAVENOUS

## 2020-12-16 MED ORDER — METFORMIN HCL 500 MG PO TABS: 500.0000 mg | ORAL_TABLET | Freq: Every day | ORAL | Status: DC

## 2020-12-16 MED ORDER — SODIUM CHLORIDE 0.9 % IV SOLN: INTRAVENOUS | Status: DC

## 2020-12-16 MED ORDER — TRAZODONE HCL 50 MG PO TABS: 25.0000 mg | ORAL_TABLET | Freq: Every evening | ORAL | Status: DC | PRN

## 2020-12-16 MED ORDER — ATORVASTATIN CALCIUM 10 MG PO TABS
10.0000 mg | ORAL_TABLET | Freq: Every day | ORAL | Status: DC
  Administered 2020-12-16 – 2020-12-18 (×3): 10 mg via ORAL
  Filled 2020-12-16 (×3): qty 1

## 2020-12-16 MED ORDER — SPIRONOLACTONE 25 MG PO TABS
12.5000 mg | ORAL_TABLET | Freq: Every day | ORAL | Status: DC
  Administered 2020-12-16: 12.5 mg via ORAL
  Filled 2020-12-16 (×2): qty 0.5

## 2020-12-16 MED ORDER — SACUBITRIL-VALSARTAN 24-26 MG PO TABS
1.0000 | ORAL_TABLET | Freq: Two times a day (BID) | ORAL | Status: DC
  Administered 2020-12-16: 1 via ORAL
  Filled 2020-12-16 (×2): qty 1

## 2020-12-16 MED ORDER — PANTOPRAZOLE SODIUM 40 MG IV SOLR
40.0000 mg | Freq: Once | INTRAVENOUS | Status: AC
  Administered 2020-12-16: 40 mg via INTRAVENOUS
  Filled 2020-12-16: qty 40

## 2020-12-16 MED ORDER — INSULIN ASPART 100 UNIT/ML IJ SOLN
0.0000 [IU] | Freq: Three times a day (TID) | INTRAMUSCULAR | Status: DC
  Administered 2020-12-17: 3 [IU] via SUBCUTANEOUS
  Filled 2020-12-16: qty 1

## 2020-12-16 MED ORDER — CARVEDILOL 12.5 MG PO TABS
12.5000 mg | ORAL_TABLET | Freq: Two times a day (BID) | ORAL | Status: DC
  Administered 2020-12-16 – 2020-12-18 (×2): 12.5 mg via ORAL
  Filled 2020-12-16: qty 2
  Filled 2020-12-16 (×2): qty 1

## 2020-12-16 MED ORDER — PANTOPRAZOLE SODIUM 40 MG IV SOLR
40.0000 mg | Freq: Two times a day (BID) | INTRAVENOUS | Status: DC
  Administered 2020-12-17 (×2): 40 mg via INTRAVENOUS
  Filled 2020-12-16 (×2): qty 40

## 2020-12-16 NOTE — ED Provider Notes (Signed)
[facility] Emergency Department Provider Note    Event Date/Time   First MD Initiated Contact with Patient 12/16/20 [phone]     (approximate)  I have reviewed the triage vital signs and the nursing notes.   HISTORY  Chief Complaint Blood In Stools and Abdominal Pain    HPI Lucas [name] is a 65 y.o. male past medical history on aspirin Plavix presents to the ER for evaluation of maroon-colored stool dark blood and some melena starting this morning.  Had some mild epigastric discomfort.  Does have a history of diverticulosis on review of his previous imaging.  No shortness of breath or chest pain.  Never had a history of GI bleeds denies any history of gastritis but does have a history of heartburn.  Has not been taking BC Goody powder.  Past Medical History:  Diagnosis Date   Acute ST elevation myocardial infarction (STEMI) involving left anterior descending (LAD) coronary artery ([place])    a. 01/2017 Ant STEMI/PCI: LAD 20ost, 100p (3.0x23 Xience [place]).   CAD (coronary artery disease)    a. 01/2017 Ant STEMI/PCI: LM 20d, LAD 20ost, 100p (3.0x23 Xience [place] DES), 51m, D1 50ost, LCX nl, RCA 30p; b. 11/2017 MV: Intermediate risk w/ 65mm inf ST dep; c. 11/2017 Cath: LM nl, LAD patent prox stent, D1 20ost, LCX nl, RCA 30p. EF 45-50%.   Chronic anticoagulation    DAPT (ASA + clopidogrel)   Chronic combined systolic (congestive) and diastolic (congestive) heart failure ([place])    a. 01/2018 Echo: EF 30-35%, sev antsept, ant, apical HK. Gr1 DD, mild MR.   Essential hypertension    Hyperlipidemia    a. Myalgias with high dose lipitor - tolerating 40mg  daily.   Ischemic cardiomyopathy    a. 01/2018 Echo: EF 30-35%.   Type II diabetes mellitus ([facility])    Family History  Problem Relation Age of Onset   Asthma Mother    Heart disease Father        s/p pacemaker   Arrhythmia Father    Asthma Father    Healthy Sister    Past Surgical History:   Procedure Laterality Date   APPENDECTOMY     CORONARY/GRAFT ACUTE MI REVASCULARIZATION N/A 01/25/2017   Procedure: Coronary/Graft Acute MI Revascularization;  Surgeon: Nelva Bush, MD;  Location: [facility];  Service: Cardiovascular;  Laterality: N/A;   LEFT HEART CATH AND CORONARY ANGIOGRAPHY N/A 01/25/2017   Procedure: LEFT HEART CATH AND CORONARY ANGIOGRAPHY;  Surgeon: Nelva Bush, MD;  Location: [facility];  Service: Cardiovascular;  Laterality: N/A;   LEFT HEART CATH AND CORONARY ANGIOGRAPHY N/A 11/20/2017   Procedure: LEFT HEART CATH AND CORONARY ANGIOGRAPHY;  Surgeon: Wellington Hampshire, MD;  Location: [facility];  Service: Cardiovascular;  Laterality: N/A;   XI ROBOTIC ASSISTED INGUINAL HERNIA REPAIR WITH MESH Right 11/06/2020   Procedure: XI ROBOTIC ASSISTED INGUINAL HERNIA REPAIR WITH MESH;  Surgeon: Olean Ree, MD;  Location: [facility];  Service: General;  Laterality: Right;   Patient Active Problem List   Diagnosis Date Noted   Right inguinal hernia    Unstable angina ([place]) 11/19/2017   Coronary artery disease of native artery of native heart with stable angina pectoris ([place]) 10/29/2017   Supraspinatus tendon tear 08/11/2017   Ischemic cardiomyopathy 04/29/2017   Hyperlipidemia LDL goal <70 04/29/2017   History of ST elevation myocardial infarction (STEMI) 04/14/2017   Prediabetes 04/14/2017   Cough 40/12/2723   Chronic systolic heart  failure ([place]) 02/02/2017   HTN (hypertension) 02/02/2017   Myalgia 02/02/2017   STEMI involving left anterior descending coronary artery ([place]) 01/25/2017      Prior to Admission medications   Medication Sig Start Date End Date Taking? Authorizing Provider  aspirin 81 MG chewable tablet TOMAR UNA TABLETA POR BOCA DIARIO 08/09/20 08/09/21  Darylene Price A, FNP  atorvastatin (LIPITOR) 10 MG tablet TOMAR UNA TABLETA POR BOCA DIARIO 08/09/20 08/09/21  Darylene Price A, FNP  carvedilol (COREG) 12.5 MG tablet TOMAR  UNA TABLETA POR BOCA DOS VECES AL DIA 08/09/20 08/09/21  Darylene Price A, FNP  clopidogrel (PLAVIX) 75 MG tablet Take 1 tablet (75 mg total) by mouth daily. 08/09/20   Alisa Graff, FNP  metFORMIN (GLUCOPHAGE) 500 MG tablet TOME UNA TABLETA TODOS LOS DIAS CON EL DESAYUNO 11/27/20   Iloabachie, Chioma E, NP  sacubitril-valsartan (ENTRESTO) 24-26 MG Take 1 tablet by mouth 2 (two) times daily. 08/09/20   Alisa Graff, FNP  spironolactone (ALDACTONE) 25 MG tablet TOMAR MEDIO TABLETA(0.5 TABLETA) POR BOCA AL DIARIO 08/09/20 07/25/21  Alisa Graff, FNP    Allergies Patient has no known allergies.    Social History Social History   Tobacco Use   Smoking status: Never   Smokeless tobacco: Never  Vaping Use   Vaping Use: Never used  Substance Use Topics   Alcohol use: No   Drug use: No    Review of Systems Patient denies headaches, rhinorrhea, blurry vision, numbness, shortness of breath, chest pain, edema, cough, abdominal pain, nausea, vomiting, diarrhea, dysuria, fevers, rashes or hallucinations unless otherwise stated above in HPI. ____________________________________________   PHYSICAL EXAM:  VITAL SIGNS: Vitals:   12/16/20 1030 12/16/20 1105  BP: 103/72 121/67  Pulse: 77 83  Resp: 15 19  Temp:    SpO2: 100% 100%    Constitutional: Alert and oriented.  Eyes: Conjunctivae are normal.  Head: Atraumatic. Nose: No congestion/rhinnorhea. Mouth/Throat: Mucous membranes are moist.   Neck: No stridor. Painless ROM.  Cardiovascular: Normal rate, regular rhythm. Grossly normal heart sounds.  Good peripheral circulation. Respiratory: Normal respiratory effort.  No retractions. Lungs CTAB. Gastrointestinal: Soft and nontender. No distention. No abdominal bruits. No CVA tenderness. Genitourinary: dark red blood on dre, no hemorrhoid, no fissure Musculoskeletal: No lower extremity tenderness nor edema.  No joint effusions. Neurologic:  Normal speech and language. No gross focal  neurologic deficits are appreciated. No facial droop Skin:  Skin is warm, dry and intact. No rash noted. Psychiatric: Mood and affect are normal. Speech and behavior are normal.  ____________________________________________   LABS (all labs ordered are listed, but only abnormal results are displayed)  Results for orders placed or performed during the hospital encounter of 12/16/20 (from the past 24 hour(s))  Comprehensive metabolic panel     Status: Abnormal   Collection Time: 12/16/20  9:37 AM  Result Value Ref Range   Sodium 138 135 - 145 mmol/L   Potassium 3.8 3.5 - 5.1 mmol/L   Chloride 101 98 - 111 mmol/L   CO2 30 22 - 32 mmol/L   Glucose, Bld 269 (H) 70 - 99 mg/dL   BUN 10 8 - 23 mg/dL   Creatinine, Ser 0.71 0.61 - 1.24 mg/dL   Calcium 9.0 8.9 - 10.3 mg/dL   Total Protein 7.0 6.5 - 8.1 g/dL   Albumin 4.2 3.5 - 5.0 g/dL   AST 19 15 - 41 U/L   ALT 18 0 - 44 U/L   Alkaline Phosphatase  48 38 - 126 U/L   Total Bilirubin 0.4 0.3 - 1.2 mg/dL   GFR, Estimated >60 >60 mL/min   Anion gap 7 5 - 15  CBC     Status: Abnormal   Collection Time: 12/16/20  9:37 AM  Result Value Ref Range   WBC 5.8 4.0 - 10.5 K/uL   RBC 3.61 (L) 4.22 - 5.81 MIL/uL   Hemoglobin 11.7 (L) 13.0 - 17.0 g/dL   HCT 33.5 (L) 39.0 - 52.0 %   MCV 92.8 80.0 - 100.0 fL   MCH 32.4 26.0 - 34.0 pg   MCHC 34.9 30.0 - 36.0 g/dL   RDW 12.2 11.5 - 15.5 %   Platelets 196 150 - 400 K/uL   nRBC 0.0 0.0 - 0.2 %  Type and screen [facility]     Status: None (Preliminary result)   Collection Time: 12/16/20  9:37 AM  Result Value Ref Range   ABO/RH(D) O POS    Antibody Screen PENDING    Sample Expiration      12/19/2020,2359 Performed at [facility], [address] [place], [place] [address]   Protime-INR     Status: None   Collection Time: 12/16/20  9:37 AM  Result Value Ref Range   Prothrombin Time 13.8 11.4 - 15.2 seconds   INR 1.1 0.8 - 1.2    ____________________________________________  EKG____________________________________________  RADIOLOGY  I personally reviewed all radiographic images ordered to evaluate for the above acute complaints and reviewed radiology reports and findings.  These findings were personally discussed with the patient.  Please see medical record for radiology report.  ____________________________________________   PROCEDURES  Procedure(s) performed:  Procedures    Critical Care performed: no ____________________________________________   INITIAL IMPRESSION / ASSESSMENT AND PLAN / ED COURSE  Pertinent labs & imaging results that were available during my care of the patient were reviewed by me and considered in my medical decision making (see chart for details).   DDX: Diverticulosis, PUD, mass, hemorrhoid, acute blood loss anemia, diverticulitis, perforation  Delynn Palestino Terisa [name] is a 65 y.o. who presents to the ED with presentation as described above.  Patient with evidence of bleeding on exam.  Hemoglobin is downtrending from previous.  He is on aspirin and Plavix.  CT imaging ordered with above differential does not show evidence of acute diverticulitis or perforation to explain the patient's pain.  His hemodynamics are stable at this point I do believe he is appropriate for admission to the hospitalist for further work-up and management.  Have discussed with the patient and available family all diagnostics and treatments performed thus far and all questions were answered to the best of my ability. The patient demonstrates understanding and agreement with plan.      The patient was evaluated in Emergency Department today for the symptoms described in the history of present illness. He/she was evaluated in the context of the global COVID-19 pandemic, which necessitated consideration that the patient might be at risk for infection with the SARS-CoV-2 virus that causes COVID-19.  Institutional protocols and algorithms that pertain to the evaluation of patients at risk for COVID-19 are in a state of rapid change based on information released by regulatory bodies including the CDC and federal and state organizations. These policies and algorithms were followed during the patient's care in the ED.  As part of my medical decision making, I reviewed the following data within the [place] notes reviewed and incorporated, Labs  reviewed, notes from prior ED visits and [place] Controlled Substance Database   ____________________________________________   FINAL CLINICAL IMPRESSION(S) / ED DIAGNOSES  Final diagnoses:  Gastrointestinal hemorrhage, unspecified gastrointestinal hemorrhage type  Diverticulosis      NEW MEDICATIONS STARTED DURING THIS VISIT:  New Prescriptions   No medications on file     Note:  This document was prepared using Dragon voice recognition software and may include unintentional dictation errors.    Merlyn Lot, MD 12/16/20 [phone]

## 2020-12-16 NOTE — Consult Note (Signed)
[facility] GI Inpatient Consult Note   Kathline Magic, M.D.  Reason for Consult: Gastrointestinal bleed   Attending Requesting Consult:  Adella Hare MD  History of Present Illness: Lucas [name] [name] is a 65 y.o. male is a non-English speaking Hispanic male with a history of heart failure with preserved ejection fraction, grade 1 diastolic dysfunction, CAD status post STEMI, prediabetes.  He recently underwent a hernia repair.  He presented to the emergency room this morning for complaints of moderate to dark red bowel movements.  He claims some persistent right flank pain since his right inguinal hernia surgery few weeks ago.  The patient's son, Lucas [name], translates during the interview. Patient denied any significant nausea, vomiting, hematemesis or melena.  He denies any known history of peptic ulcer disease.  He claims that the stools started abruptly yesterday and were 50% brown and 50% bloody.  He had a few recurrences but none since he has been in the emergency room today.  Patient's hemoglobin dropped from 16 down to 11 and is being closely monitored.  Patient son says that his father has been advised to obtain colonoscopy for screening but has refused this recommendation.  Patient takes Plavix and aspirin for history of coronary disease but claims to have not taken Plavix in the last 2 or 3 days.  Patient denies chest pain, shortness of breath, dyspnea on exertion, retrosternal burning, water brash or regurgitation.  No dysphagia.  Past Medical History:  Past Medical History:  Diagnosis Date   Acute ST elevation myocardial infarction (STEMI) involving left anterior descending (LAD) coronary artery ([place])    a. 01/2017 Ant STEMI/PCI: LAD 20ost, 100p (3.0x23 Xience [place]).   CAD (coronary artery disease)    a. 01/2017 Ant STEMI/PCI: LM 20d, LAD 20ost, 100p (3.0x23 Xience [place] DES), 55m, D1 50ost, LCX nl, RCA 30p; b. 11/2017 MV: Intermediate risk w/ 49mm inf ST dep; c.  11/2017 Cath: LM nl, LAD patent prox stent, D1 20ost, LCX nl, RCA 30p. EF 45-50%.   Chronic anticoagulation    DAPT (ASA + clopidogrel)   Chronic combined systolic (congestive) and diastolic (congestive) heart failure ([place])    a. 01/2018 Echo: EF 30-35%, sev antsept, ant, apical HK. Gr1 DD, mild MR.   Essential hypertension    Hyperlipidemia    a. Myalgias with high dose lipitor - tolerating 40mg  daily.   Ischemic cardiomyopathy    a. 01/2018 Echo: EF 30-35%.   Type II diabetes mellitus ([place])     Problem List: Patient Active Problem List   Diagnosis Date Noted   GI bleed 12/16/2020   Right inguinal hernia    Unstable angina ([place]) 11/19/2017   Coronary artery disease of native artery of native heart with stable angina pectoris ([place]) 10/29/2017   Supraspinatus tendon tear 08/11/2017   Ischemic cardiomyopathy 04/29/2017   Hyperlipidemia LDL goal <70 04/29/2017   History of ST elevation myocardial infarction (STEMI) 04/14/2017   Prediabetes 04/14/2017   Cough 00/93/8182   Chronic systolic heart failure ([place]) 02/02/2017   HTN (hypertension) 02/02/2017   Myalgia 02/02/2017   STEMI involving left anterior descending coronary artery ([place]) 01/25/2017    Past Surgical History: Past Surgical History:  Procedure Laterality Date   APPENDECTOMY     CORONARY/GRAFT ACUTE MI REVASCULARIZATION N/A 01/25/2017   Procedure: Coronary/Graft Acute MI Revascularization;  Surgeon: Nelva Bush, MD;  Location: [facility];  Service: Cardiovascular;  Laterality: N/A;   LEFT HEART CATH AND CORONARY ANGIOGRAPHY N/A 01/25/2017  Procedure: LEFT HEART CATH AND CORONARY ANGIOGRAPHY;  Surgeon: Nelva Bush, MD;  Location: [facility];  Service: Cardiovascular;  Laterality: N/A;   LEFT HEART CATH AND CORONARY ANGIOGRAPHY N/A 11/20/2017   Procedure: LEFT HEART CATH AND CORONARY ANGIOGRAPHY;  Surgeon: Wellington Hampshire, MD;  Location: [facility];  Service: Cardiovascular;   Laterality: N/A;   XI ROBOTIC ASSISTED INGUINAL HERNIA REPAIR WITH MESH Right 11/06/2020   Procedure: XI ROBOTIC ASSISTED INGUINAL HERNIA REPAIR WITH MESH;  Surgeon: Olean Ree, MD;  Location: [facility];  Service: General;  Laterality: Right;    Allergies: No Known Allergies  Home Medications: (Not in a hospital admission)  Home medication reconciliation was completed with the patient.   Scheduled Inpatient Medications:    atorvastatin  10 mg Oral Daily   carvedilol  12.5 mg Oral BID WC   insulin aspart  0-15 Units Subcutaneous TID WC   [START ON 12/18/2020] metFORMIN  500 mg Oral Q breakfast   pantoprazole (PROTONIX) IV  40 mg Intravenous Q12H   sacubitril-valsartan  1 tablet Oral BID   spironolactone  12.5 mg Oral Daily    Continuous Inpatient Infusions:    sodium chloride 50 mL/hr at 12/16/20 1406    PRN Inpatient Medications:  acetaminophen **OR** acetaminophen, fentaNYL (SUBLIMAZE) injection, traZODone  Family History: family history includes Arrhythmia in his father; Asthma in his father and mother; Healthy in his sister; Heart disease in his father.   GI Family History: Negative.  Social History:   reports that he has never smoked. He has never used smokeless tobacco. He reports that he does not drink alcohol and does not use drugs. The patient denies ETOH, tobacco, or drug use.    Review of Systems: Review of Systems - Negative except that in the history of present illness.  Physical Examination: BP (!) 116/103   Pulse 69   Temp 98.4 F (36.9 C) (Oral)   Resp 17   Ht 5' (1.524 m)   Wt 65.3 kg   SpO2 100%   BMI 28.12 kg/m  Physical Exam Constitutional:      General: He is not in acute distress.    Appearance: He is well-developed and normal weight. He is not ill-appearing, toxic-appearing or diaphoretic.  HENT:     Head: Normocephalic and atraumatic.  Eyes:     General: No scleral icterus.    Extraocular Movements: Extraocular movements intact.      Pupils: Pupils are equal, round, and reactive to light.  Cardiovascular:     Rate and Rhythm: Normal rate.     Heart sounds: Normal heart sounds. No murmur heard.   No gallop.  Pulmonary:     Effort: Pulmonary effort is normal. No respiratory distress.     Breath sounds: Normal breath sounds. No stridor. No wheezing, rhonchi or rales.  Abdominal:     General: Abdomen is flat. Bowel sounds are normal.     Palpations: Abdomen is soft. There is no fluid wave, splenomegaly or pulsatile mass.     Tenderness: There is no abdominal tenderness.     Hernia: No hernia is present.  Skin:    General: Skin is warm and dry.  Neurological:     General: No focal deficit present.     Mental Status: He is alert and oriented to person, place, and time.     Cranial Nerves: No cranial nerve deficit.     Motor: No weakness.  Psychiatric:  Mood and Affect: Mood normal. Mood is not anxious or depressed.        Behavior: Behavior normal.    Data: Lab Results  Component Value Date   WBC 5.8 12/16/2020   HGB 11.7 (L) 12/16/2020   HCT 33.5 (L) 12/16/2020   MCV 92.8 12/16/2020   PLT 196 12/16/2020   Recent Labs  Lab 12/16/20 0937  HGB 11.7*   Lab Results  Component Value Date   NA 138 12/16/2020   K 3.8 12/16/2020   CL 101 12/16/2020   CO2 30 12/16/2020   BUN 10 12/16/2020   CREATININE 0.71 12/16/2020   Lab Results  Component Value Date   ALT 18 12/16/2020   AST 19 12/16/2020   ALKPHOS 48 12/16/2020   BILITOT 0.4 12/16/2020   Recent Labs  Lab 12/16/20 0937  INR 1.1   CBC Latest Ref Rng & Units 12/16/2020 10/16/2020 08/19/2018  WBC 4.0 - 10.5 K/uL 5.8 6.3 7.2  Hemoglobin 13.0 - 17.0 g/dL 11.7(L) 16.1 15.5  Hematocrit 39.0 - 52.0 % 33.5(L) 45.0 45.7  Platelets 150 - 400 K/uL 196 190 209    STUDIES: DG Chest 2 View  Result Date: 12/16/2020 CLINICAL DATA:  Upper abdominal pain.  CHF.  Myocardial infarction EXAM: CHEST - 2 VIEW COMPARISON:  01/25/2017 FINDINGS: Midline  trachea. Normal heart size. Tortuous thoracic aorta. No pleural effusion or pneumothorax. Right upper lobe calcified granuloma. Subtle pattern of interstitial thickening and possible mild nodularity, not readily apparent on the prior. Right lower lobe presumed nipple shadow. Left base scarring. IMPRESSION: Subtle interstitial thickening and possible diffuse pulmonary nodularity. Consider further evaluation with chest CT. Electronically Signed   By: Abigail Miyamoto M.D.   On: 12/16/2020 13:45   CT ANGIO GI BLEED  Result Date: 12/16/2020 CLINICAL DATA:  Blood in stool, abdominal pain EXAM: CTA ABDOMEN AND PELVIS WITHOUT AND WITH CONTRAST TECHNIQUE: Multidetector CT imaging of the abdomen and pelvis was performed using the standard protocol during bolus administration of intravenous contrast. Multiplanar reconstructed images and MIPs were obtained and reviewed to evaluate the vascular anatomy. CONTRAST:  182mL OMNIPAQUE IOHEXOL 350 MG/ML SOLN COMPARISON:  CT abdomen/pelvis 10/16/2020 FINDINGS: VASCULAR Aorta: Normal caliber aorta without aneurysm, dissection, vasculitis or significant stenosis. Celiac: Patent without evidence of aneurysm, dissection, vasculitis or significant stenosis. SMA: Patent without evidence of aneurysm, dissection, vasculitis or significant stenosis. Renals: Both renal arteries are patent without evidence of aneurysm, dissection, vasculitis, fibromuscular dysplasia or significant stenosis. IMA: Patent without evidence of aneurysm, dissection, vasculitis or significant stenosis. Inflow: Patent without evidence of aneurysm, dissection, vasculitis or significant stenosis. There is minimal calcified atherosclerotic plaque in the proximal right common iliac artery. Proximal Outflow: Bilateral common femoral and visualized portions of the superficial and profunda femoral arteries are patent without evidence of aneurysm, dissection, vasculitis or significant stenosis. Veins: The main portal and  splenic veins are patent on the venous phase images. There is no evidence of active GI bleed. Review of the MIP images confirms the above findings. NON-VASCULAR Lower chest: The lung bases are clear. Hepatobiliary: The liver and gallbladder are unremarkable. There is no biliary ductal dilatation. Pancreas: Unremarkable. Spleen: Unremarkable. Adrenals/Urinary Tract: The adrenals are unremarkable. A small subcentimeter lesion in the left kidney is unchanged, too small to characterize but likely a small cyst. The kidneys are otherwise unremarkable, with no other focal lesions. There are no stones. There is no hydronephrosis or hydroureter. Previously seen herniation of the bladder into the right inguinal canal has been  surgically repaired, likely accounting for the fat stranding in the right lower pelvis. Stomach/Bowel: The stomach is unremarkable there is no evidence of bowel obstruction. There is extensive colonic diverticulosis without evidence of acute diverticulitis. Lymphatic: There is no abdominal or pelvic lymphadenopathy. Reproductive: The prostate and seminal vesicles are unremarkable. Other: There is no ascites or free air. Musculoskeletal: There are mild degenerative changes at L5-S1. There is no acute osseous abnormality or aggressive osseous lesion. IMPRESSION: 1. No evidence of active GI bleed. 2. Colonic diverticulosis without evidence of acute diverticulitis. 3. Interval right inguinal hernia repair. Electronically Signed   By: Valetta Mole M.D.   On: 12/16/2020 11:25   @IMAGES @  Assessment:  1.  Gastrointestinal bleeding-appears to be possibly lower GI but cannot rule out an upper GI source given right flank pain.  Differential diagnosis includes colonic diverticular bleed, ischemic colitis, colon malignancy, inflammatory bowel disease, etc.  Potential upper GI sources include peptic ulcer disease, [place] lesion, etc. 2.  CAD status post STEMI-currently hemodynamically stable without symptoms  of angina. 3.  HFpEF. 4. Chronic anticoagulation w/ Plavix and ASA. Held.  COVID-19 status: Tested negative.  Recommendations:  Continue holding Plavix. Serial H/H. IV acid suppression. EGD and colonoscopy when feasible. The patient seemed to  understand the nature of the planned procedure, indications, risks, alternatives and potential complications including but not limited to bleeding, infection, perforation, damage to internal organs and possible oversedation/side effects from anesthesia. The patient agrees and gives consent to proceed. Please refer to procedure notes for findings, recommendations and patient disposition/instructions.  Thank you for the consult. Please call with questions or concerns.  Olean Ree, "Lanny Hurst MD [facility] [place],  [phone]  12/16/2020 2:58 PM

## 2020-12-16 NOTE — ED Triage Notes (Signed)
Per interpreter, pt reports he went to the bathroom and there was blood in his stool that was dark in color. Pt reports happened last pm. Pt also reports some abd pain.

## 2020-12-16 NOTE — Plan of Care (Signed)

## 2020-12-16 NOTE — H&P (Signed)
History and Physical    Lucas [name] TDV:[id number] DOB: 08-Sep-1955 DOA: 12/16/2020  PCP: [name], MD   Patient coming from: home  I have personally briefly reviewed patient's old medical records in [place]  Chief Complaint: maroon colored stools  HPI: Lucas [name] is a 65 y.o. male with medical history significant of CAD,STEMI, HFrEF with grade I diastolic dysfunction,  controlled prediabetes, recent hernia repair presents to the ER for evaluation of maroon-colored stool, dark blood and some melena starting last night with last BM this AM.  Had some mild epigastric discomfort.  Does have a history of diverticulosis on review of his previous imaging.  No shortness of breath or chest pain.  Never had a history of GI bleeds denies any history of gastritis but does have a history of heartburn.  Has not been taking BC Goody powder.  ED Course: T98.4  121/75  HR 73 RR 15. Unremarkable EDP exam except for rectal exam confirming maroon colored, heme positive stool. Hgb 11.7, INR 1.1 Glucose 269. CT angio w/o active bleed, no vascular disease. In ED given 40 mg Protonix. T&S sent to lab. [facility] called to admit for continued monitoring and to arrange GI consult. Patient does not speak english - for this exam a translator was present.   Review of Systems: As per HPI otherwise 10 point review of systems negative.    Past Medical History:  Diagnosis Date   Acute ST elevation myocardial infarction (STEMI) involving left anterior descending (LAD) coronary artery ([place])    a. 01/2017 Ant STEMI/PCI: LAD 20ost, 100p (3.0x23 Xience Donna).   CAD (coronary artery disease)    a. 01/2017 Ant STEMI/PCI: LM 20d, LAD 20ost, 100p (3.0x23 Xience Sierra DES), 35m, D1 50ost, LCX nl, RCA 30p; b. 11/2017 MV: Intermediate risk w/ 27mm inf ST dep; c. 11/2017 Cath: LM nl, LAD patent prox stent, D1 20ost, LCX nl, RCA 30p. EF 45-50%.   Chronic anticoagulation    DAPT (ASA + clopidogrel)    Chronic combined systolic (congestive) and diastolic (congestive) heart failure ([place])    a. 01/2018 Echo: EF 30-35%, sev antsept, ant, apical HK. Gr1 DD, mild MR.   Essential hypertension    Hyperlipidemia    a. Myalgias with high dose lipitor - tolerating 40mg  daily.   Ischemic cardiomyopathy    a. 01/2018 Echo: EF 30-35%.   Type II diabetes mellitus ([place])     Past Surgical History:  Procedure Laterality Date   APPENDECTOMY     CORONARY/GRAFT ACUTE MI REVASCULARIZATION N/A 01/25/2017   Procedure: Coronary/Graft Acute MI Revascularization;  Surgeon: Nelva Bush, MD;  Location: [facility];  Service: Cardiovascular;  Laterality: N/A;   LEFT HEART CATH AND CORONARY ANGIOGRAPHY N/A 01/25/2017   Procedure: LEFT HEART CATH AND CORONARY ANGIOGRAPHY;  Surgeon: Nelva Bush, MD;  Location: [facility];  Service: Cardiovascular;  Laterality: N/A;   LEFT HEART CATH AND CORONARY ANGIOGRAPHY N/A 11/20/2017   Procedure: LEFT HEART CATH AND CORONARY ANGIOGRAPHY;  Surgeon: Wellington Hampshire, MD;  Location: [facility];  Service: Cardiovascular;  Laterality: N/A;   XI ROBOTIC ASSISTED INGUINAL HERNIA REPAIR WITH MESH Right 11/06/2020   Procedure: XI ROBOTIC ASSISTED INGUINAL HERNIA REPAIR WITH MESH;  Surgeon: Olean Ree, MD;  Location: [facility];  Service: General;  Laterality: Right;    Soc Hx - married (?divorced but still living with same woman) 2  children. He is a native or [place], [place]. He  works as a IT consultant.     reports that he has never smoked. He has never used smokeless tobacco. He reports that he does not drink alcohol and does not use drugs.  No Known Allergies  Family History  Problem Relation Age of Onset   Asthma Mother    Heart disease Father        s/p pacemaker   Arrhythmia Father    Asthma Father    Healthy Sister      Prior to Admission medications   Medication Sig Start Date End Date Taking? Authorizing Provider  aspirin 81  MG chewable tablet TOMAR UNA TABLETA POR BOCA DIARIO 08/09/20 08/09/21  Darylene Price A, FNP  atorvastatin (LIPITOR) 10 MG tablet TOMAR UNA TABLETA POR BOCA DIARIO 08/09/20 08/09/21  Darylene Price A, FNP  carvedilol (COREG) 12.5 MG tablet TOMAR UNA TABLETA POR BOCA DOS VECES AL DIA 08/09/20 08/09/21  Darylene Price A, FNP  clopidogrel (PLAVIX) 75 MG tablet Take 1 tablet (75 mg total) by mouth daily. 08/09/20   Alisa Graff, FNP  metFORMIN (GLUCOPHAGE) 500 MG tablet TOME UNA TABLETA TODOS LOS DIAS CON EL DESAYUNO 11/27/20   Iloabachie, Chioma E, NP  sacubitril-valsartan (ENTRESTO) 24-26 MG Take 1 tablet by mouth 2 (two) times daily. 08/09/20   Alisa Graff, FNP  spironolactone (ALDACTONE) 25 MG tablet TOMAR MEDIO TABLETA(0.5 TABLETA) POR BOCA AL DIARIO 08/09/20 07/25/21  Alisa Graff, FNP    Physical Exam: Vitals:   12/16/20 1030 12/16/20 1105 12/16/20 1130 12/16/20 1200  BP: 103/72 121/67 104/72 121/75  Pulse: 77 83 81 73  Resp: 15 19 18 15   Temp:      TempSrc:      SpO2: 100% 100% 98% 100%  Weight:      Height:         Vitals:   12/16/20 1030 12/16/20 1105 12/16/20 1130 12/16/20 1200  BP: 103/72 121/67 104/72 121/75  Pulse: 77 83 81 73  Resp: 15 19 18 15   Temp:      TempSrc:      SpO2: 100% 100% 98% 100%  Weight:      Height:       General: WNWD man in no distress Eyes: PERRL, lids and conjunctivae normal ENMT: Mucous membranes are moist. Posterior pharynx clear of any exudate or lesions.Normal dentition.  Neck: normal, supple, no masses, no thyromegaly Respiratory: clear to auscultation bilaterally, no wheezing, no crackles. Normal respiratory effort. No accessory muscle use.  Cardiovascular: Regular rate and rhythm, no murmurs / rubs / gallops. No extremity edema. 2+ pedal pulses. No carotid bruits.  Abdomen: tenderness to deep palpation epigastrum and RUQ, no masses palpated. No hepatosplenomegaly. Bowel sounds positive.  Musculoskeletal: no clubbing / cyanosis. No joint deformity  upper and lower extremities. Good ROM, no contractures. Normal muscle tone.  Skin: no rashes, lesions, ulcers. No induration Neurologic: CN 2-12 grossly intact.  Strength 5/5 in all 4.  Psychiatric: Normal judgment and insight. Alert and oriented x 3. Normal mood.    Labs on Admission: I have personally reviewed following labs and imaging studies  CBC: Recent Labs  Lab 12/16/20 0937  WBC 5.8  HGB 11.7*  HCT 33.5*  MCV 92.8  PLT 657   Basic Metabolic Panel: Recent Labs  Lab 12/16/20 0937  NA 138  K 3.8  CL 101  CO2 30  GLUCOSE 269*  BUN 10  CREATININE 0.71  CALCIUM 9.0   GFR: Estimated Creatinine Clearance: 75 mL/min (by C-G  formula based on SCr of 0.71 mg/dL). Liver Function Tests: Recent Labs  Lab 12/16/20 0937  AST 19  ALT 18  ALKPHOS 48  BILITOT 0.4  PROT 7.0  ALBUMIN 4.2   No results for input(s): LIPASE, AMYLASE in the last 168 hours. No results for input(s): AMMONIA in the last 168 hours. Coagulation Profile: Recent Labs  Lab 12/16/20 0937  INR 1.1   Cardiac Enzymes: No results for input(s): CKTOTAL, CKMB, CKMBINDEX, TROPONINI in the last 168 hours. BNP (last 3 results) No results for input(s): PROBNP in the last 8760 hours. HbA1C: No results for input(s): HGBA1C in the last 72 hours. CBG: No results for input(s): GLUCAP in the last 168 hours. Lipid Profile: No results for input(s): CHOL, HDL, LDLCALC, TRIG, CHOLHDL, LDLDIRECT in the last 72 hours. Thyroid Function Tests: No results for input(s): TSH, T4TOTAL, FREET4, T3FREE, THYROIDAB in the last 72 hours. Anemia Panel: No results for input(s): VITAMINB12, FOLATE, FERRITIN, TIBC, IRON, RETICCTPCT in the last 72 hours. Urine analysis:    Component Value Date/Time   COLORURINE COLORLESS (A) 10/16/2020 1342   APPEARANCEUR CLEAR (A) 10/16/2020 1342   LABSPEC 1.025 10/16/2020 1342   PHURINE 7.0 10/16/2020 1342   GLUCOSEU NEGATIVE 10/16/2020 1342   HGBUR NEGATIVE 10/16/2020 1342    [place] 10/16/2020 1342   KETONESUR NEGATIVE 10/16/2020 1342   PROTEINUR NEGATIVE 10/16/2020 1342   NITRITE NEGATIVE 10/16/2020 1342   LEUKOCYTESUR NEGATIVE 10/16/2020 1342    Radiological Exams on Admission: CT ANGIO GI BLEED  Result Date: 12/16/2020 CLINICAL DATA:  Blood in stool, abdominal pain EXAM: CTA ABDOMEN AND PELVIS WITHOUT AND WITH CONTRAST TECHNIQUE: Multidetector CT imaging of the abdomen and pelvis was performed using the standard protocol during bolus administration of intravenous contrast. Multiplanar reconstructed images and MIPs were obtained and reviewed to evaluate the vascular anatomy. CONTRAST:  172mL OMNIPAQUE IOHEXOL 350 MG/ML SOLN COMPARISON:  CT abdomen/pelvis 10/16/2020 FINDINGS: VASCULAR Aorta: Normal caliber aorta without aneurysm, dissection, vasculitis or significant stenosis. Celiac: Patent without evidence of aneurysm, dissection, vasculitis or significant stenosis. SMA: Patent without evidence of aneurysm, dissection, vasculitis or significant stenosis. Renals: Both renal arteries are patent without evidence of aneurysm, dissection, vasculitis, fibromuscular dysplasia or significant stenosis. IMA: Patent without evidence of aneurysm, dissection, vasculitis or significant stenosis. Inflow: Patent without evidence of aneurysm, dissection, vasculitis or significant stenosis. There is minimal calcified atherosclerotic plaque in the proximal right common iliac artery. Proximal Outflow: Bilateral common femoral and visualized portions of the superficial and profunda femoral arteries are patent without evidence of aneurysm, dissection, vasculitis or significant stenosis. Veins: The main portal and splenic veins are patent on the venous phase images. There is no evidence of active GI bleed. Review of the MIP images confirms the above findings. NON-VASCULAR Lower chest: The lung bases are clear. Hepatobiliary: The liver and gallbladder are unremarkable. There is no  biliary ductal dilatation. Pancreas: Unremarkable. Spleen: Unremarkable. Adrenals/Urinary Tract: The adrenals are unremarkable. A small subcentimeter lesion in the left kidney is unchanged, too small to characterize but likely a small cyst. The kidneys are otherwise unremarkable, with no other focal lesions. There are no stones. There is no hydronephrosis or hydroureter. Previously seen herniation of the bladder into the right inguinal canal has been surgically repaired, likely accounting for the fat stranding in the right lower pelvis. Stomach/Bowel: The stomach is unremarkable there is no evidence of bowel obstruction. There is extensive colonic diverticulosis without evidence of acute diverticulitis. Lymphatic: There is no abdominal or pelvic lymphadenopathy.  Reproductive: The prostate and seminal vesicles are unremarkable. Other: There is no ascites or free air. Musculoskeletal: There are mild degenerative changes at L5-S1. There is no acute osseous abnormality or aggressive osseous lesion. IMPRESSION: 1. No evidence of active GI bleed. 2. Colonic diverticulosis without evidence of acute diverticulitis. 3. Interval right inguinal hernia repair. Electronically Signed   By: Valetta Mole M.D.   On: 12/16/2020 11:25    EKG: Independently reviewed. No EKG on chart  Assessment/Plan Active Problems:   Chronic systolic heart failure (HCC)   HTN (hypertension)   Prediabetes   GI bleed  GI bleed - with maroon colored stool and upper abdominal tenderness suspect Upper GI bleed, possibly duodenal. Has diverticulosis but has not a large volume frankly blood stools. His last Hgb was 16.2 g now at 11.7 g. Plan H&H q 8  Type and screen sent to lab  Protonix IV 40 mg q 12  Hold ASA and Plavix  GI consult - secure chat sent to Dr. Alice Reichert  2. Chronic HFrEF - stable without signs of decompensation. Plan Continue home meds  2D echo - last study 2019 with EF 40-45% with grade I diastolic dysfunction  3. HTN-  stable  will continue cardiac meds  4. DM - last A1C 11/27/20 5.9% Plan Continue metformin  Ss coverage  DVT prophylaxis: SCDs  Code Status: full code  Family Communication: Deno Lunger (son) was present and was Optometrist.  Disposition Plan: home 24-48 hours if stable ( Consults called: GI - Dr. Alice Reichert - sent secure chat  Admission status: obs    Adella Hare MD [facility] Pager [phone]  If 7PM-7AM, please contact night-coverage [email or web address] Password [email or web address]  12/16/2020, 12:46 PM

## 2020-12-16 NOTE — Progress Notes (Signed)
Patient arrived to unit. This RN obtained admission information with the help of patient's son as interpretor. VSS. A&Ox4. Room air. No telemetry ordered. IV R AC with NS @ 64ml/hr. Plan for NPO at midnight.

## 2020-12-17 ENCOUNTER — Observation Stay (HOSPITAL_COMMUNITY)
Admit: 2020-12-17 | Discharge: 2020-12-17 | Disposition: A | Discharge status: Home / Self Care | Payer: Self-pay | Attending: Internal Medicine | Admitting: Internal Medicine

## 2020-12-17 DIAGNOSIS — I5021 Acute systolic (congestive) heart failure: Secondary | ICD-10-CM

## 2020-12-17 LAB — ECHOCARDIOGRAM COMPLETE
AR max vel: 2.02 cm2
AV Area VTI: 1.66 cm2
AV Area mean vel: 1.78 cm2
AV Mean grad: 4 mmHg
AV Peak grad: 6.6 mmHg
Ao pk vel: 1.28 m/s
Area-P 1/2: 4.06 cm2
Height: 60 in
MV VTI: 2.1 cm2
S' Lateral: 3.2 cm
Weight: 2409.19 oz

## 2020-12-17 LAB — HEMOGLOBIN AND HEMATOCRIT, BLOOD
HCT: 29 % — ABNORMAL LOW (ref 39.0–52.0)
HCT: 29.2 % — ABNORMAL LOW (ref 39.0–52.0)
Hemoglobin: 10.2 g/dL — ABNORMAL LOW (ref 13.0–17.0)
Hemoglobin: 10.5 g/dL — ABNORMAL LOW (ref 13.0–17.0)

## 2020-12-17 LAB — GLUCOSE, CAPILLARY
Glucose-Capillary: 105 mg/dL — ABNORMAL HIGH (ref 70–99)
Glucose-Capillary: 199 mg/dL — ABNORMAL HIGH (ref 70–99)
Glucose-Capillary: 100 mg/dL — ABNORMAL HIGH (ref 70–99)
Glucose-Capillary: 95 mg/dL (ref 70–99)

## 2020-12-17 MED ORDER — PANTOPRAZOLE SODIUM 40 MG IV SOLR
40.0000 mg | Freq: Two times a day (BID) | INTRAVENOUS | Status: DC
  Administered 2020-12-17 – 2020-12-18 (×2): 40 mg via INTRAVENOUS
  Filled 2020-12-17 (×2): qty 40

## 2020-12-17 MED ORDER — PERFLUTREN LIPID MICROSPHERE
1.0000 mL | INTRAVENOUS | Status: AC | PRN
  Administered 2020-12-17: 4 mL via INTRAVENOUS
  Filled 2020-12-17: qty 10

## 2020-12-17 MED ORDER — POLYETHYLENE GLYCOL 3350 17 GM/SCOOP PO POWD
1.0000 | Freq: Once | ORAL | Status: AC
  Administered 2020-12-17: 255 g via ORAL
  Filled 2020-12-17: qty 255

## 2020-12-17 NOTE — Progress Notes (Signed)
GI Inpatient Follow-up Note  Subjective:  Patient seen in follow-up for GI bleed. No acute events overnight. Son, [name], is present in room and helps to translate. Patient reports he has had three BMs so far today and has seen a small amount of bleeding. Bleeding appears to be less than yesterday. Right flank pain is improved. He denies any fevers, chills, abdominal pain, frank diarrhea, nausea, or vomiting. He is on clear liquid diet today and tolerating well.    Scheduled Inpatient Medications:   atorvastatin  10 mg Oral Daily   carvedilol  12.5 mg Oral BID WC   influenza vac split quadrivalent PF  0.5 mL Intramuscular Tomorrow-1000   insulin aspart  0-15 Units Subcutaneous TID WC   [START ON 12/18/2020] metFORMIN  500 mg Oral Q breakfast   pantoprazole (PROTONIX) IV  40 mg Intravenous Q12H    Continuous Inpatient Infusions:    PRN Inpatient Medications:  acetaminophen **OR** acetaminophen, fentaNYL (SUBLIMAZE) injection, traZODone  Review of Systems: Constitutional: Weight is stable.  Eyes: No changes in vision. ENT: No oral lesions, sore throat.  GI: see HPI.  Heme/Lymph: No easy bruising.  CV: No chest pain.  GU: No hematuria.  Integumentary: No rashes.  Neuro: No headaches.  Psych: No depression/anxiety.  Endocrine: No heat/cold intolerance.  Allergic/Immunologic: No urticaria.  Resp: No cough, SOB.  Musculoskeletal: No joint swelling.    Physical Examination: BP 108/73 (BP Location: Left Arm)   Pulse 70   Temp 98.3 F (36.8 C) (Oral)   Resp 16   Ht 5' (1.524 m)   Wt 68.3 kg   SpO2 99%   BMI 29.41 kg/m  Non-toxic appearing male resting comfortably on bed. Son, [name], in room and helps to translate.  Gen: NAD, alert and oriented x 4 HEENT: PEERLA, EOMI, Neck: supple, no JVD or thyromegaly Chest: CTA bilaterally, no wheezes, crackles, or other adventitious sounds CV: RRR, no m/g/c/r Abd: soft, NT, ND, +BS in all four quadrants; no HSM, guarding, ridigity,  or rebound tenderness Ext: no edema, well perfused with 2+ pulses, Skin: no rash or lesions noted Lymph: no LAD  Data: Lab Results  Component Value Date   WBC 5.8 12/16/2020   HGB 10.5 (L) 12/17/2020   HCT 29.2 (L) 12/17/2020   MCV 92.8 12/16/2020   PLT 196 12/16/2020   Recent Labs  Lab 12/16/20 1600 12/16/20 2359 12/17/20 0732  HGB 10.4* 10.2* 10.5*   Lab Results  Component Value Date   NA 138 12/16/2020   K 3.8 12/16/2020   CL 101 12/16/2020   CO2 30 12/16/2020   BUN 10 12/16/2020   CREATININE 0.71 12/16/2020   Lab Results  Component Value Date   ALT 18 12/16/2020   AST 19 12/16/2020   ALKPHOS 48 12/16/2020   BILITOT 0.4 12/16/2020   Recent Labs  Lab 12/16/20 0937  INR 1.1   Assessment/Plan:  65 y/o Hispanic male with a PMH of CAD s/p stenting, Hx of STEMI, HFrEF with grade I diastolic dysfunction, prediabetes, recent hernia repair, HTN, and HLD admitted to the hospital for GI bleeding  GI Bleeding - stable, H&H stable.  CAD s/p STEMI  HFrEF Chronic antiplatelet therapy - Plavix on hold   Recommendations:  - EGD and colonoscopy tomorrow with Dr. Alice Reichert - Continue to monitor H&H closely. Transfuse for Hgb <7.0. - Continue IV acid suppression - Continue to hold all antiplatelet/anticoagulation therapy - Clear liquid diet today. NPO after midnight. Bowel prep this afternoon.  - Further  recommendations after procedures   I reviewed the risks (including bleeding, perforation, infection, anesthesia complications, cardiac/respiratory complications), benefits and alternatives of EGD and colonoscopy. Patient consents to proceed.    Please call with questions or concerns.    Octavia Bruckner, PA-C [facility] [phone] (Cell)

## 2020-12-17 NOTE — Progress Notes (Signed)
PROGRESS NOTE    Lucas [name]  ZDG:[id number] DOB: 11/12/1955 DOA: 12/16/2020 PCP: [name], MD  [phone]   Assessment & Plan:   Active Problems:   Chronic systolic heart failure (HCC)   HTN (hypertension)   Prediabetes   GI bleed   Lucas [name] [name] is a 65 y.o. male with medical history significant of CAD,STEMI, HFrEF with grade I diastolic dysfunction,  controlled prediabetes, recent hernia repair presents to the ER for evaluation of maroon-colored stool, dark blood and some melena starting last night with last BM this AM.  Had some mild epigastric discomfort.  Does have a history of diverticulosis on review of his previous imaging.    GI bleed  --with maroon colored stool and upper abdominal tenderness suspect Upper GI bleed, possibly duodenal. Has diverticulosis but has not a large volume frankly blood stools. His last Hgb was 16.2 g now at 11.7 g. Plan: --EGD and colonoscopy tomorrow --cont IV PPI BID --hold ASA and plavix   2. Chronic combined CHF - stable without signs of decompensation. --2D echo - last study 2019 with EF 40-45% with grade I diastolic dysfunction Plan: --cont home coreg --hold home Entresto and Aldactone 2/2 low BP   3. HTN --cont home coreg --hold home Entresto and Aldactone 2/2 low BP   4. DM  - last A1C 11/27/20 5.9% --hold home metformin --SSI TID for now since there have been some high BG readings   DVT prophylaxis: SCD/Compression stockings Code Status: Full code  Family Communication: son updated at bedside today  Level of care: Med-Surg Dispo:   The patient is from: home Anticipated d/c is to: home Anticipated d/c date is: 1-2 days Patient currently is not medically ready to d/c due to: pending EGD and colonoscopy   Subjective and Interval History:  Son served as Optometrist.  Nursing noted BM with blood streaks this morning.  Pt reported mild right-sided abdominal pain.   Objective: Vitals:    12/17/20 0417 12/17/20 0506 12/17/20 0841 12/17/20 1606  BP: 97/70  108/73 106/71  Pulse: 71  70 (!) 59  Resp: 16     Temp: 98.1 F (36.7 C)  98.3 F (36.8 C) 98 F (36.7 C)  TempSrc: Oral  Oral Oral  SpO2: 98%  99% 100%  Weight:  68.3 kg    Height:        Intake/Output Summary (Last 24 hours) at 12/17/2020 1625 Last data filed at 12/17/2020 1408 Gross per 24 hour  Intake 360 ml  Output 0 ml  Net 360 ml   Filed Weights   12/16/20 0908 12/17/20 0506  Weight: 65.3 kg 68.3 kg    Examination:   Constitutional: NAD, AAOx3 HEENT: conjunctivae and lids normal, EOMI CV: No cyanosis.   RESP: normal respiratory effort, on RA Extremities: No effusions, edema in BLE SKIN: warm, dry Neuro: II - XII grossly intact.   Psych: Normal mood and affect.  Appropriate judgement and reason   Data Reviewed: I have personally reviewed following labs and imaging studies  CBC: Recent Labs  Lab 12/16/20 0937 12/16/20 1600 12/16/20 2359 12/17/20 0732  WBC 5.8  --   --   --   HGB 11.7* 10.4* 10.2* 10.5*  HCT 33.5* 29.5* 29.0* 29.2*  MCV 92.8  --   --   --   PLT 196  --   --   --    Basic Metabolic Panel: Recent Labs  Lab 12/16/20 0937  NA 138  K 3.8  CL 101  CO2 30  GLUCOSE 269*  BUN 10  CREATININE 0.71  CALCIUM 9.0   GFR: Estimated Creatinine Clearance: 76.6 mL/min (by C-G formula based on SCr of 0.71 mg/dL). Liver Function Tests: Recent Labs  Lab 12/16/20 0937  AST 19  ALT 18  ALKPHOS 48  BILITOT 0.4  PROT 7.0  ALBUMIN 4.2   No results for input(s): LIPASE, AMYLASE in the last 168 hours. No results for input(s): AMMONIA in the last 168 hours. Coagulation Profile: Recent Labs  Lab 12/16/20 0937  INR 1.1   Cardiac Enzymes: No results for input(s): CKTOTAL, CKMB, CKMBINDEX, TROPONINI in the last 168 hours. BNP (last 3 results) No results for input(s): PROBNP in the last 8760 hours. HbA1C: No results for input(s): HGBA1C in the last 72 hours. CBG: Recent  Labs  Lab 12/16/20 1632 12/16/20 1850 12/16/20 2123 12/17/20 0839 12/17/20 1242  GLUCAP 183* 101* 144* 105* 199*   Lipid Profile: No results for input(s): CHOL, HDL, LDLCALC, TRIG, CHOLHDL, LDLDIRECT in the last 72 hours. Thyroid Function Tests: No results for input(s): TSH, T4TOTAL, FREET4, T3FREE, THYROIDAB in the last 72 hours. Anemia Panel: No results for input(s): VITAMINB12, FOLATE, FERRITIN, TIBC, IRON, RETICCTPCT in the last 72 hours. Sepsis Labs: No results for input(s): PROCALCITON, LATICACIDVEN in the last 168 hours.  Recent Results (from the past 240 hour(s))  Resp Panel by RT-PCR (Flu A&B, Covid) Nasopharyngeal Swab     Status: None   Collection Time: 12/16/20 11:53 AM   Specimen: Nasopharyngeal Swab; Nasopharyngeal(NP) swabs in vial transport medium  Result Value Ref Range Status   SARS Coronavirus 2 by RT PCR NEGATIVE NEGATIVE Final    Comment: (NOTE) SARS-CoV-2 target nucleic acids are NOT DETECTED.  The SARS-CoV-2 RNA is generally detectable in upper respiratory specimens during the acute phase of infection. The lowest concentration of SARS-CoV-2 viral copies this assay can detect is 138 copies/mL. A negative result does not preclude SARS-Cov-2 infection and should not be used as the sole basis for treatment or other patient management decisions. A negative result may occur with  improper specimen collection/handling, submission of specimen other than nasopharyngeal swab, presence of viral mutation(s) within the areas targeted by this assay, and inadequate number of viral copies(<138 copies/mL). A negative result must be combined with clinical observations, patient history, and epidemiological information. The expected result is Negative.  Fact Sheet for Patients:  EntrepreneurPulse.com.au  Fact Sheet for Healthcare Providers:  IncredibleEmployment.be  This test is no t yet approved or cleared by the [place] FDA  and  has been authorized for detection and/or diagnosis of SARS-CoV-2 by FDA under an Emergency Use Authorization (EUA). This EUA will remain  in effect (meaning this test can be used) for the duration of the COVID-19 declaration under Section 564(b)(1) of the Act, 21 U.S.C.section 360bbb-3(b)(1), unless the authorization is terminated  or revoked sooner.       Influenza A by PCR NEGATIVE NEGATIVE Final   Influenza B by PCR NEGATIVE NEGATIVE Final    Comment: (NOTE) The Xpert Xpress SARS-CoV-2/FLU/RSV plus assay is intended as an aid in the diagnosis of influenza from Nasopharyngeal swab specimens and should not be used as a sole basis for treatment. Nasal washings and aspirates are unacceptable for Xpert Xpress SARS-CoV-2/FLU/RSV testing.  Fact Sheet for Patients: EntrepreneurPulse.com.au  Fact Sheet for Healthcare Providers: IncredibleEmployment.be  This test is not yet approved or cleared by the [place] FDA and has been authorized for detection and/or diagnosis of SARS-CoV-2  by FDA under an Emergency Use Authorization (EUA). This EUA will remain in effect (meaning this test can be used) for the duration of the COVID-19 declaration under Section 564(b)(1) of the Act, 21 U.S.C. section 360bbb-3(b)(1), unless the authorization is terminated or revoked.  Performed at [facility], [address] [place], [place] [address]       Radiology Studies: DG Chest 2 View  Result Date: 12/16/2020 CLINICAL DATA:  Upper abdominal pain.  CHF.  Myocardial infarction EXAM: CHEST - 2 VIEW COMPARISON:  01/25/2017 FINDINGS: Midline trachea. Normal heart size. Tortuous thoracic aorta. No pleural effusion or pneumothorax. Right upper lobe calcified granuloma. Subtle pattern of interstitial thickening and possible mild nodularity, not readily apparent on the prior. Right lower lobe presumed nipple shadow. Left base scarring. IMPRESSION: Subtle  interstitial thickening and possible diffuse pulmonary nodularity. Consider further evaluation with chest CT. Electronically Signed   By: Abigail Miyamoto M.D.   On: 12/16/2020 13:45   ECHOCARDIOGRAM COMPLETE  Result Date: 12/17/2020    ECHOCARDIOGRAM REPORT   Patient Name:   TOLBERT MATHESON Date of Exam: 12/17/2020 Medical Rec #:  [id number]          Height:       60.0 in Accession #:    [id number]         Weight:       150.6 lb Date of Birth:  1955/09/22          BSA:          1.654 m Patient Age:    61 years           BP:           108/73 mmHg Patient Gender: M                  HR:           69 bpm. Exam Location:  [facility] Procedure: 2D Echo, Color Doppler, Cardiac Doppler and Intracardiac            Opacification Agent Indications:     I50.21 congestive heart failure-Acute Systolic  History:         Patient has prior history of Echocardiogram examinations. CHF,                  CAD; Risk Factors:Hypertension, Diabetes and Dyslipidemia.  Sonographer:     Charmayne Sheer Referring Phys:  [id number] MICHAEL E NORINS Diagnosing Phys: Nelva Bush MD  Sonographer Comments: Technically difficult study due to poor echo windows. IMPRESSIONS  1. Left ventricular ejection fraction, by estimation, is 35 to 40%. The left ventricle has moderately decreased function. The left ventricle demonstrates regional wall motion abnormalities (see scoring diagram/findings for description). Left ventricular  diastolic parameters are consistent with Grade II diastolic dysfunction (pseudonormalization). There is severe hypokinesis of the left ventricular, mid-apical anterior wall, anteroseptal wall and apical segment.  2. Right ventricular systolic function is normal. The right ventricular size is normal.  3. Left atrial size was mildly dilated.  4. The mitral valve is grossly normal. Trivial mitral valve regurgitation. No evidence of mitral stenosis.  5. The aortic valve was not well visualized. Aortic valve regurgitation is not visualized. No  aortic stenosis is present. FINDINGS  Left Ventricle: Left ventricular ejection fraction, by estimation, is 35 to 40%. The left ventricle has moderately decreased function. The left ventricle demonstrates regional wall motion abnormalities. Severe hypokinesis of the left ventricular, mid-apical anterior wall, anteroseptal wall and apical segment. Definity contrast  agent was given IV to delineate the left ventricular endocardial borders. The left ventricular internal cavity size was normal in size. There is no left ventricular hypertrophy. Left ventricular diastolic parameters are consistent with Grade II diastolic dysfunction (pseudonormalization). Right Ventricle: The right ventricular size is normal. No increase in right ventricular wall thickness. Right ventricular systolic function is normal. Left Atrium: Left atrial size was mildly dilated. Right Atrium: Right atrial size was normal in size. Pericardium: The pericardium was not well visualized. Mitral Valve: The mitral valve is grossly normal. Trivial mitral valve regurgitation. No evidence of mitral valve stenosis. MV peak gradient, 3.1 mmHg. The mean mitral valve gradient is 1.0 mmHg. Tricuspid Valve: The tricuspid valve is normal in structure. Tricuspid valve regurgitation is not demonstrated. Aortic Valve: The aortic valve was not well visualized. Aortic valve regurgitation is not visualized. No aortic stenosis is present. Aortic valve mean gradient measures 4.0 mmHg. Aortic valve peak gradient measures 6.6 mmHg. Aortic valve area, by VTI measures 1.66 cm. Pulmonic Valve: The pulmonic valve was not well visualized. Pulmonic valve regurgitation is not visualized. No evidence of pulmonic stenosis. Aorta: The aortic root and ascending aorta are structurally normal, with no evidence of dilitation. Pulmonary Artery: The pulmonary artery is of normal size. Venous: The inferior vena cava was not well visualized. IAS/Shunts: The interatrial septum was not well  visualized.  LEFT VENTRICLE PLAX 2D LVIDd:         4.00 cm   Diastology LVIDs:         3.20 cm   LV e' medial:    9.46 cm/s LV PW:         1.00 cm   LV E/e' medial:  9.2 LV IVS:        0.90 cm   LV e' lateral:   6.53 cm/s LVOT diam:     2.00 cm   LV E/e' lateral: 13.3 LV SV:         43 LV SV Index:   26 LVOT Area:     3.14 cm  RIGHT VENTRICLE RV S prime:     11.60 cm/s LEFT ATRIUM           Index        RIGHT ATRIUM           Index LA diam:      2.60 cm 1.57 cm/m   RA Area:     16.75 cm LA Vol (A4C): 64.7 ml 39.08 ml/m  RA Volume:   48.52 ml  29.33 ml/m  AORTIC VALVE                    PULMONIC VALVE AV Area (Vmax):    2.02 cm     PV Vmax:       0.84 m/s AV Area (Vmean):   1.78 cm     PV Vmean:      63.700 cm/s AV Area (VTI):     1.66 cm     PV VTI:        0.192 m AV Vmax:           128.00 cm/s  PV Peak grad:  2.9 mmHg AV Vmean:          92.200 cm/s  PV Mean grad:  2.0 mmHg AV VTI:            0.257 m AV Peak Grad:      6.6 mmHg AV Mean Grad:      4.0  mmHg LVOT Vmax:         82.20 cm/s LVOT Vmean:        52.100 cm/s LVOT VTI:          0.136 m LVOT/AV VTI ratio: 0.53  AORTA Ao Root diam: 3.40 cm MITRAL VALVE MV Area (PHT): 4.06 cm    SHUNTS MV Area VTI:   2.10 cm    Systemic VTI:  0.14 m MV Peak grad:  3.1 mmHg    Systemic Diam: 2.00 cm MV Mean grad:  1.0 mmHg MV Vmax:       0.88 m/s MV Vmean:      45.6 cm/s MV Decel Time: 187 msec MV E velocity: 87.00 cm/s MV A velocity: 85.70 cm/s MV E/A ratio:  1.02 Harrell Gave End MD Electronically signed by Nelva Bush MD Signature Date/Time: 12/17/2020/12:52:52 PM    Final    CT ANGIO GI BLEED  Result Date: 12/16/2020 CLINICAL DATA:  Blood in stool, abdominal pain EXAM: CTA ABDOMEN AND PELVIS WITHOUT AND WITH CONTRAST TECHNIQUE: Multidetector CT imaging of the abdomen and pelvis was performed using the standard protocol during bolus administration of intravenous contrast. Multiplanar reconstructed images and MIPs were obtained and reviewed to evaluate the  vascular anatomy. CONTRAST:  162mL OMNIPAQUE IOHEXOL 350 MG/ML SOLN COMPARISON:  CT abdomen/pelvis 10/16/2020 FINDINGS: VASCULAR Aorta: Normal caliber aorta without aneurysm, dissection, vasculitis or significant stenosis. Celiac: Patent without evidence of aneurysm, dissection, vasculitis or significant stenosis. SMA: Patent without evidence of aneurysm, dissection, vasculitis or significant stenosis. Renals: Both renal arteries are patent without evidence of aneurysm, dissection, vasculitis, fibromuscular dysplasia or significant stenosis. IMA: Patent without evidence of aneurysm, dissection, vasculitis or significant stenosis. Inflow: Patent without evidence of aneurysm, dissection, vasculitis or significant stenosis. There is minimal calcified atherosclerotic plaque in the proximal right common iliac artery. Proximal Outflow: Bilateral common femoral and visualized portions of the superficial and profunda femoral arteries are patent without evidence of aneurysm, dissection, vasculitis or significant stenosis. Veins: The main portal and splenic veins are patent on the venous phase images. There is no evidence of active GI bleed. Review of the MIP images confirms the above findings. NON-VASCULAR Lower chest: The lung bases are clear. Hepatobiliary: The liver and gallbladder are unremarkable. There is no biliary ductal dilatation. Pancreas: Unremarkable. Spleen: Unremarkable. Adrenals/Urinary Tract: The adrenals are unremarkable. A small subcentimeter lesion in the left kidney is unchanged, too small to characterize but likely a small cyst. The kidneys are otherwise unremarkable, with no other focal lesions. There are no stones. There is no hydronephrosis or hydroureter. Previously seen herniation of the bladder into the right inguinal canal has been surgically repaired, likely accounting for the fat stranding in the right lower pelvis. Stomach/Bowel: The stomach is unremarkable there is no evidence of bowel  obstruction. There is extensive colonic diverticulosis without evidence of acute diverticulitis. Lymphatic: There is no abdominal or pelvic lymphadenopathy. Reproductive: The prostate and seminal vesicles are unremarkable. Other: There is no ascites or free air. Musculoskeletal: There are mild degenerative changes at L5-S1. There is no acute osseous abnormality or aggressive osseous lesion. IMPRESSION: 1. No evidence of active GI bleed. 2. Colonic diverticulosis without evidence of acute diverticulitis. 3. Interval right inguinal hernia repair. Electronically Signed   By: Valetta Mole M.D.   On: 12/16/2020 11:25     Scheduled Meds:  atorvastatin  10 mg Oral Daily   carvedilol  12.5 mg Oral BID WC   influenza vac split quadrivalent PF  0.5 mL Intramuscular  Tomorrow-1000   insulin aspart  0-15 Units Subcutaneous TID WC   [START ON 12/18/2020] metFORMIN  500 mg Oral Q breakfast   pantoprazole (PROTONIX) IV  40 mg Intravenous Q12H   polyethylene glycol powder  1 Container Oral Once   Continuous Infusions:   LOS: 0 days     Enzo Bi, MD [facility] If 7PM-7AM, please contact night-coverage 12/17/2020, 4:25 PM

## 2020-12-17 NOTE — Progress Notes (Signed)
*  PRELIMINARY RESULTS* Echocardiogram 2D Echocardiogram has been performed.  Lucas [name] 12/17/2020, 12:03 PM

## 2020-12-18 ENCOUNTER — Encounter
Admission: EM | Disposition: A | Discharge status: Home / Self Care | Payer: Self-pay | Source: Home / Self Care | Attending: Hospitalist

## 2020-12-18 ENCOUNTER — Inpatient Hospital Stay: Payer: Self-pay | Admitting: Anesthesiology

## 2020-12-18 ENCOUNTER — Encounter: Payer: Self-pay | Admitting: Hospitalist

## 2020-12-18 ENCOUNTER — Telehealth: Payer: Self-pay | Admitting: Pharmacy Technician

## 2020-12-18 HISTORY — PX: COLONOSCOPY: SHX5424

## 2020-12-18 HISTORY — PX: ESOPHAGOGASTRODUODENOSCOPY: SHX5428

## 2020-12-18 LAB — CBC
HCT: 31.6 % — ABNORMAL LOW (ref 39.0–52.0)
Hemoglobin: 11.3 g/dL — ABNORMAL LOW (ref 13.0–17.0)
MCH: 33.1 pg (ref 26.0–34.0)
MCHC: 35.8 g/dL (ref 30.0–36.0)
MCV: 92.7 fL (ref 80.0–100.0)
Platelets: 202 10*3/uL (ref 150–400)
RBC: 3.41 MIL/uL — ABNORMAL LOW (ref 4.22–5.81)
RDW: 12.2 % (ref 11.5–15.5)
WBC: 5.1 10*3/uL (ref 4.0–10.5)
nRBC: 0 % (ref 0.0–0.2)

## 2020-12-18 LAB — BASIC METABOLIC PANEL
Anion gap: 5 (ref 5–15)
BUN: 5 mg/dL — ABNORMAL LOW (ref 8–23)
CO2: 29 mmol/L (ref 22–32)
Calcium: 8.8 mg/dL — ABNORMAL LOW (ref 8.9–10.3)
Chloride: 107 mmol/L (ref 98–111)
Creatinine, Ser: 0.7 mg/dL (ref 0.61–1.24)
GFR, Estimated: >60 mL/min (ref >60)
Glucose, Bld: 104 mg/dL — ABNORMAL HIGH (ref 70–99)
Potassium: 3.8 mmol/L (ref 3.5–5.1)
Sodium: 141 mmol/L (ref 135–145)

## 2020-12-18 LAB — MAGNESIUM: Magnesium: 2.1 mg/dL (ref 1.7–2.4)

## 2020-12-18 LAB — TROPONIN I (HIGH SENSITIVITY): Troponin I (High Sensitivity): 4 ng/L (ref <18)

## 2020-12-18 LAB — GLUCOSE, CAPILLARY
Glucose-Capillary: 114 mg/dL — ABNORMAL HIGH (ref 70–99)
Glucose-Capillary: 100 mg/dL — ABNORMAL HIGH (ref 70–99)

## 2020-12-18 SURGERY — EGD (ESOPHAGOGASTRODUODENOSCOPY)
Anesthesia: General

## 2020-12-18 MED ORDER — PROPOFOL 10 MG/ML IV BOLUS
INTRAVENOUS | Status: DC | PRN
  Administered 2020-12-18: 10 mg via INTRAVENOUS
  Administered 2020-12-18: 70 mg via INTRAVENOUS
  Administered 2020-12-18: 10 mg via INTRAVENOUS

## 2020-12-18 MED ORDER — PANTOPRAZOLE SODIUM 40 MG PO TBEC: 40.0000 mg | DELAYED_RELEASE_TABLET | Freq: Every day | ORAL | 2 refills | Status: DC

## 2020-12-18 MED ORDER — PROPOFOL 500 MG/50ML IV EMUL
INTRAVENOUS | Status: DC | PRN
  Administered 2020-12-18: 150 ug/kg/min via INTRAVENOUS

## 2020-12-18 MED ORDER — PHENYLEPHRINE HCL (PRESSORS) 10 MG/ML IV SOLN
INTRAVENOUS | Status: DC | PRN
  Administered 2020-12-18: 100 ug via INTRAVENOUS

## 2020-12-18 MED ORDER — LIDOCAINE HCL (CARDIAC) PF 100 MG/5ML IV SOSY
PREFILLED_SYRINGE | INTRAVENOUS | Status: DC | PRN
  Administered 2020-12-18: 60 mg via INTRAVENOUS

## 2020-12-18 MED ORDER — SPIRONOLACTONE 25 MG PO TABS: ORAL_TABLET | ORAL | 3 refills | Status: DC

## 2020-12-18 MED ORDER — ONDANSETRON HCL 4 MG/2ML IJ SOLN: 4.0000 mg | Freq: Once | INTRAMUSCULAR | Status: DC | PRN

## 2020-12-18 MED ORDER — GLYCOPYRROLATE 0.2 MG/ML IJ SOLN
INTRAMUSCULAR | Status: DC | PRN
  Administered 2020-12-18: .2 mg via INTRAVENOUS

## 2020-12-18 MED ORDER — PROPOFOL 500 MG/50ML IV EMUL
INTRAVENOUS | Status: AC
  Filled 2020-12-18: qty 50

## 2020-12-18 MED ORDER — SACUBITRIL-VALSARTAN 24-26 MG PO TABS
ORAL_TABLET | ORAL | 3 refills | Status: DC
Start: 2020-12-18 — End: 2021-11-05

## 2020-12-18 MED ORDER — SODIUM CHLORIDE 0.9 % IV SOLN: INTRAVENOUS | Status: DC

## 2020-12-18 NOTE — TOC CM/SW Note (Signed)
Patient has orders to discharge home today. Chart reviewed. PCP is Dewaine Conger, MD. On room air. No wounds. No insurance. Discharging on new prescription for Protonix. Printed GoodRx coupon and put it on front of chart. Sent secure chat to RN to notify. No further concerns. CSW signing off.  Dayton Scrape, [place]

## 2020-12-18 NOTE — Anesthesia Postprocedure Evaluation (Signed)
Anesthesia Post Note  Patient: Lucas [name]  Procedure(s) Performed: ESOPHAGOGASTRODUODENOSCOPY (EGD) COLONOSCOPY  Patient location during evaluation: Phase II Anesthesia Type: General Level of consciousness: awake and alert, awake and oriented Pain management: pain level controlled Vital Signs Assessment: post-procedure vital signs reviewed and stable Respiratory status: spontaneous breathing, nonlabored ventilation and respiratory function stable Cardiovascular status: blood pressure returned to baseline and stable Postop Assessment: no apparent nausea or vomiting Anesthetic complications: no   No notable events documented.   Last Vitals:  Vitals:   12/18/20 1324 12/18/20 1342  BP: 103/75 109/69  Pulse: 70 65  Resp:  18  Temp:  36.6 C  SpO2: 100% 99%    Last Pain:  Vitals:   12/18/20 1342  TempSrc: Oral  PainSc:                  Phill Mutter

## 2020-12-18 NOTE — Transfer of Care (Signed)
Immediate Anesthesia Transfer of Care Note  Patient: Lucas [name]  Procedure(s) Performed: Procedure(s): ESOPHAGOGASTRODUODENOSCOPY (EGD) (N/A) COLONOSCOPY (N/A)  Patient Location: PACU and Endoscopy Unit  Anesthesia Type:General  Level of Consciousness: sedated  Airway & Oxygen Therapy: Patient Spontanous Breathing and Patient connected to nasal cannula oxygen  Post-op Assessment: Report given to RN and Post -op Vital signs reviewed and stable  Post vital signs: Reviewed and stable  Last Vitals:  Vitals:   12/18/20 0736 12/18/20 1254  BP: 121/67 102/67  Pulse: 62 85  Resp: 18 16  Temp: 36.7 C (!) 36.3 C  SpO2: 16% 579%    Complications: No apparent anesthesia complications

## 2020-12-18 NOTE — Op Note (Signed)
[facility] Patient Name: Lucas [name] Procedure Date: 12/18/2020 12:27 PM MRN: [id number] Account #: [id number] Date of Birth: 15-Nov-1955 Admit Type: Inpatient Age: 65 Room: [facility] ENDO ROOM 3 Gender: Male Note Status: Finalized Instrument Name: Jasper Riling [id number] Procedure:             Colonoscopy Indications:           Hematochezia, Acute post hemorrhagic anemia Providers:             Benay Pike. Aleph Nickson MD, MD Medicines:             Propofol per Anesthesia Complications:         No immediate complications. Procedure:             Pre-Anesthesia Assessment:                        - The risks and benefits of the procedure and the                         sedation options and risks were discussed with the                         patient. All questions were answered and informed                         consent was obtained.                        - Patient identification and proposed procedure were                         verified prior to the procedure by the nurse. The                         procedure was verified in the procedure room.                        - ASA Grade Assessment: III - A patient with severe                         systemic disease.                        - After reviewing the risks and benefits, the patient                         was deemed in satisfactory condition to undergo the                         procedure.                        After obtaining informed consent, the colonoscope was                         passed under direct vision. Throughout the procedure,                         the patient's blood pressure, pulse, and oxygen  saturations were monitored continuously. The                         Colonoscope was introduced through the anus and                         advanced to the the cecum, identified by appendiceal                         orifice and ileocecal valve. The  colonoscopy was                         performed without difficulty. The patient tolerated                         the procedure well. The quality of the bowel                         preparation was good. The ileocecal valve, appendiceal                         orifice, and rectum were photographed. Findings:      The perianal and digital rectal examinations were normal. Pertinent       negatives include normal sphincter tone and no palpable rectal lesions.      Non-bleeding internal hemorrhoids were found during retroflexion. The       hemorrhoids were Grade I (internal hemorrhoids that do not prolapse).      Many small and large-mouthed diverticula were found in the entire colon.       There was no evidence of diverticular bleeding.      There is no endoscopic evidence of bleeding, mass or polyps in the       entire colon. Impression:            - Non-bleeding internal hemorrhoids.                        - Moderate diverticulosis in the entire examined                         colon. There was no evidence of diverticular bleeding.                        - No specimens collected. Recommendation:        - Patient has a contact number available for                         emergencies. The signs and symptoms of potential                         delayed complications were discussed with the patient.                         Return to normal activities tomorrow. Written                         discharge instructions were provided to the patient.                        -  Resume previous diet.                        - Continue present medications.                        - Repeat colonoscopy in 10 years for screening                         purposes.                        - Return patient to hospital ward for possible                         discharge same day.                        - Return to GI office PRN.                        - Check H. Pylori stool Ag for duodenitis and                          gastritis noted on EGD. Procedure Code(s):     --- Professional ---                        [phone], Colonoscopy, flexible; diagnostic, including                         collection of specimen(s) by brushing or washing, when                         performed (separate procedure) Diagnosis Code(s):     --- Professional ---                        K57.30, Diverticulosis of large intestine without                         perforation or abscess without bleeding                        D62, Acute posthemorrhagic anemia                        K92.1, Melena (includes Hematochezia)                        K64.0, First degree hemorrhoids CPT copyright 2019 [employer]. All rights reserved. The codes documented in this report are preliminary and upon coder review may  be revised to meet current compliance requirements. Efrain Sella MD, MD 12/18/2020 12:54:56 PM This report has been signed electronically. Number of Addenda: 0 Note Initiated On: 12/18/2020 12:27 PM Scope Withdrawal Time: 0 hours 6 minutes 24 seconds  Total Procedure Duration: 0 hours 11 minutes 22 seconds  Estimated Blood Loss:  Estimated blood loss: none.      [facility]

## 2020-12-18 NOTE — Discharge Summary (Signed)
Physician Discharge Summary   Lucas [name]  male DOB: 24-Dec-1955  VOZ:[id number]  PCP: Lucas Millers, MD  Admit date: 12/16/2020 Discharge date: 12/18/2020  Admitted From: home Disposition:  home Updated son at bedside prior to discharge.  CODE STATUS: Full code  Discharge Instructions     Discharge instructions   Complete by: As directed    Colonoscopy found diverticulosis which is the likely source of your GI bleeding, which has resolved, and GI doctor is ok with you resuming your plavix.  Upper endoscopy found some gastritis, so I have started you on acid reflux medication Protonix.    Your blood pressure has been low normal in the hospital.  Please hold your Entresto and Aldactone until followup with your outpatient doctor.   Dr. Enzo Bi [facility] Course:  For full details, please see H&P, progress notes, consult notes and ancillary notes.  Briefly,  Lucas [name] is a 65 y.o. male with medical history significant of CAD, STEMI, HFrEF with grade I diastolic dysfunction, controlled prediabetes, recent hernia repair presented to the ER for evaluation of maroon-colored stool, dark blood and some melena starting night before presentation.  Had some mild epigastric discomfort.  Does have a history of diverticulosis on review of his previous imaging.    GI bleed likely 2/2 diverticular bleed --last Hgb was 16.2 g in Aug 2022, presented at 11.7 g. --EGD found gastritis, so pt was started on protonix. -- colonoscopy found diverticulosis which GI deemed the likely source of bleeding, which had resolved.  GI cleared pt to resume home plavix.   Chronic combined CHF - stable without signs of decompensation. --2D echo - last study 2019 with EF 40-45% with grade I diastolic dysfunction --cont home coreg --hold home Entresto and Aldactone 2/2 low BP pending outpatient followup.   HTN --cont home coreg --hold home Entresto and Aldactone  2/2 low BP pending outpatient followup.   DM  - last A1C 11/27/20 5.9% --SSI TID while inpatient since there had been some high BG readings. --Home metformin resumed after discharge.  CAD --cont ASA, and statin --cleared by GI to resume plavix.   Discharge Diagnoses:  Active Problems:   Chronic systolic heart failure (HCC)   HTN (hypertension)   Prediabetes   GI bleed   30 Day Unplanned Readmission Risk Score    Flowsheet Row ED to Hosp-Admission (Current) from 12/16/2020 in [facility]  30 Day Unplanned Readmission Risk Score (%) 11.23 Filed at 12/18/2020 1200       This score is the patient's risk of an unplanned readmission within 30 days of being discharged (0 -100%). The score is based on dignosis, age, lab data, medications, orders, and past utilization.   Low:  0-14.9   Medium: 15-21.9   High: 22-29.9   Extreme: 30 and above         Discharge Instructions:  Allergies as of 12/18/2020   No Known Allergies      Medication List     TAKE these medications    aspirin 81 MG chewable tablet TOMAR UNA TABLETA POR BOCA DIARIO   atorvastatin 10 MG tablet Commonly known as: LIPITOR TOMAR UNA TABLETA POR BOCA DIARIO   carvedilol 12.5 MG tablet Commonly known as: COREG TOMAR UNA TABLETA POR BOCA DOS VECES AL DIA   clopidogrel 75 MG tablet Commonly known as: PLAVIX Take 1 tablet (75 mg total) by mouth daily.  metFORMIN 500 MG tablet Commonly known as: GLUCOPHAGE TOME UNA TABLETA TODOS LOS DIAS CON EL DESAYUNO   pantoprazole 40 MG tablet Commonly known as: Protonix Take 1 tablet (40 mg total) by mouth daily.   sacubitril-valsartan 24-26 MG Commonly known as: ENTRESTO Hold until followup with your outpatient doctor because your blood has been low normal without this. What changed:  how much to take how to take this when to take this additional instructions   spironolactone 25 MG tablet Commonly known as:  ALDACTONE Hold until followup with your outpatient doctor because your blood has been low normal without this. What changed: additional instructions         Follow-up Information     Lucas Millers, MD Follow up in 1 week(s).   Specialty: Internal Medicine Contact information: [address] [facility] [place] [facility] [address] [phone]                 No Known Allergies   The results of significant diagnostics from this hospitalization (including imaging, microbiology, ancillary and laboratory) are listed below for reference.   Consultations:   Procedures/Studies: DG Chest 2 View  Result Date: 12/16/2020 CLINICAL DATA:  Upper abdominal pain.  CHF.  Myocardial infarction EXAM: CHEST - 2 VIEW COMPARISON:  01/25/2017 FINDINGS: Midline trachea. Normal heart size. Tortuous thoracic aorta. No pleural effusion or pneumothorax. Right upper lobe calcified granuloma. Subtle pattern of interstitial thickening and possible mild nodularity, not readily apparent on the prior. Right lower lobe presumed nipple shadow. Left base scarring. IMPRESSION: Subtle interstitial thickening and possible diffuse pulmonary nodularity. Consider further evaluation with chest CT. Electronically Signed   By: Abigail Miyamoto M.D.   On: 12/16/2020 13:45   ECHOCARDIOGRAM COMPLETE  Result Date: 12/17/2020    ECHOCARDIOGRAM REPORT   Patient Name:   Lucas [name] Date of Exam: 12/17/2020 Medical Rec #:  [id number]          Height:       60.0 in Accession #:    [id number]         Weight:       150.6 lb Date of Birth:  Jul 18, 1955          BSA:          1.654 m Patient Age:    89 years           BP:           108/73 mmHg Patient Gender: M                  HR:           69 bpm. Exam Location:  [facility] Procedure: 2D Echo, Color Doppler, Cardiac Doppler and Intracardiac            Opacification Agent Indications:     I50.21 congestive heart failure-Acute Systolic  History:         Patient has prior history of Echocardiogram  examinations. CHF,                  CAD; Risk Factors:Hypertension, Diabetes and Dyslipidemia.  Sonographer:     Charmayne Sheer Referring Phys:  [id number] MICHAEL E NORINS Diagnosing Phys: Nelva Bush MD  Sonographer Comments: Technically difficult study due to poor echo windows. IMPRESSIONS  1. Left ventricular ejection fraction, by estimation, is 35 to 40%. The left ventricle has moderately decreased function. The left ventricle demonstrates regional wall motion abnormalities (see scoring diagram/findings for description). Left ventricular  diastolic parameters  are consistent with Grade II diastolic dysfunction (pseudonormalization). There is severe hypokinesis of the left ventricular, mid-apical anterior wall, anteroseptal wall and apical segment.  2. Right ventricular systolic function is normal. The right ventricular size is normal.  3. Left atrial size was mildly dilated.  4. The mitral valve is grossly normal. Trivial mitral valve regurgitation. No evidence of mitral stenosis.  5. The aortic valve was not well visualized. Aortic valve regurgitation is not visualized. No aortic stenosis is present. FINDINGS  Left Ventricle: Left ventricular ejection fraction, by estimation, is 35 to 40%. The left ventricle has moderately decreased function. The left ventricle demonstrates regional wall motion abnormalities. Severe hypokinesis of the left ventricular, mid-apical anterior wall, anteroseptal wall and apical segment. Definity contrast agent was given IV to delineate the left ventricular endocardial borders. The left ventricular internal cavity size was normal in size. There is no left ventricular hypertrophy. Left ventricular diastolic parameters are consistent with Grade II diastolic dysfunction (pseudonormalization). Right Ventricle: The right ventricular size is normal. No increase in right ventricular wall thickness. Right ventricular systolic function is normal. Left Atrium: Left atrial size was mildly dilated.  Right Atrium: Right atrial size was normal in size. Pericardium: The pericardium was not well visualized. Mitral Valve: The mitral valve is grossly normal. Trivial mitral valve regurgitation. No evidence of mitral valve stenosis. MV peak gradient, 3.1 mmHg. The mean mitral valve gradient is 1.0 mmHg. Tricuspid Valve: The tricuspid valve is normal in structure. Tricuspid valve regurgitation is not demonstrated. Aortic Valve: The aortic valve was not well visualized. Aortic valve regurgitation is not visualized. No aortic stenosis is present. Aortic valve mean gradient measures 4.0 mmHg. Aortic valve peak gradient measures 6.6 mmHg. Aortic valve area, by VTI measures 1.66 cm. Pulmonic Valve: The pulmonic valve was not well visualized. Pulmonic valve regurgitation is not visualized. No evidence of pulmonic stenosis. Aorta: The aortic root and ascending aorta are structurally normal, with no evidence of dilitation. Pulmonary Artery: The pulmonary artery is of normal size. Venous: The inferior vena cava was not well visualized. IAS/Shunts: The interatrial septum was not well visualized.  LEFT VENTRICLE PLAX 2D LVIDd:         4.00 cm   Diastology LVIDs:         3.20 cm   LV e' medial:    9.46 cm/s LV PW:         1.00 cm   LV E/e' medial:  9.2 LV IVS:        0.90 cm   LV e' lateral:   6.53 cm/s LVOT diam:     2.00 cm   LV E/e' lateral: 13.3 LV SV:         43 LV SV Index:   26 LVOT Area:     3.14 cm  RIGHT VENTRICLE RV S prime:     11.60 cm/s LEFT ATRIUM           Index        RIGHT ATRIUM           Index LA diam:      2.60 cm 1.57 cm/m   RA Area:     16.75 cm LA Vol (A4C): 64.7 ml 39.08 ml/m  RA Volume:   48.52 ml  29.33 ml/m  AORTIC VALVE                    PULMONIC VALVE AV Area (Vmax):    2.02 cm     PV  Vmax:       0.84 m/s AV Area (Vmean):   1.78 cm     PV Vmean:      63.700 cm/s AV Area (VTI):     1.66 cm     PV VTI:        0.192 m AV Vmax:           128.00 cm/s  PV Peak grad:  2.9 mmHg AV Vmean:           92.200 cm/s  PV Mean grad:  2.0 mmHg AV VTI:            0.257 m AV Peak Grad:      6.6 mmHg AV Mean Grad:      4.0 mmHg LVOT Vmax:         82.20 cm/s LVOT Vmean:        52.100 cm/s LVOT VTI:          0.136 m LVOT/AV VTI ratio: 0.53  AORTA Ao Root diam: 3.40 cm MITRAL VALVE MV Area (PHT): 4.06 cm    SHUNTS MV Area VTI:   2.10 cm    Systemic VTI:  0.14 m MV Peak grad:  3.1 mmHg    Systemic Diam: 2.00 cm MV Mean grad:  1.0 mmHg MV Vmax:       0.88 m/s MV Vmean:      45.6 cm/s MV Decel Time: 187 msec MV E velocity: 87.00 cm/s MV A velocity: 85.70 cm/s MV E/A ratio:  1.02 Harrell Gave End MD Electronically signed by Nelva Bush MD Signature Date/Time: 12/17/2020/12:52:52 PM    Final    CT ANGIO GI BLEED  Result Date: 12/16/2020 CLINICAL DATA:  Blood in stool, abdominal pain EXAM: CTA ABDOMEN AND PELVIS WITHOUT AND WITH CONTRAST TECHNIQUE: Multidetector CT imaging of the abdomen and pelvis was performed using the standard protocol during bolus administration of intravenous contrast. Multiplanar reconstructed images and MIPs were obtained and reviewed to evaluate the vascular anatomy. CONTRAST:  144mL OMNIPAQUE IOHEXOL 350 MG/ML SOLN COMPARISON:  CT abdomen/pelvis 10/16/2020 FINDINGS: VASCULAR Aorta: Normal caliber aorta without aneurysm, dissection, vasculitis or significant stenosis. Celiac: Patent without evidence of aneurysm, dissection, vasculitis or significant stenosis. SMA: Patent without evidence of aneurysm, dissection, vasculitis or significant stenosis. Renals: Both renal arteries are patent without evidence of aneurysm, dissection, vasculitis, fibromuscular dysplasia or significant stenosis. IMA: Patent without evidence of aneurysm, dissection, vasculitis or significant stenosis. Inflow: Patent without evidence of aneurysm, dissection, vasculitis or significant stenosis. There is minimal calcified atherosclerotic plaque in the proximal right common iliac artery. Proximal Outflow: Bilateral common  femoral and visualized portions of the superficial and profunda femoral arteries are patent without evidence of aneurysm, dissection, vasculitis or significant stenosis. Veins: The main portal and splenic veins are patent on the venous phase images. There is no evidence of active GI bleed. Review of the MIP images confirms the above findings. NON-VASCULAR Lower chest: The lung bases are clear. Hepatobiliary: The liver and gallbladder are unremarkable. There is no biliary ductal dilatation. Pancreas: Unremarkable. Spleen: Unremarkable. Adrenals/Urinary Tract: The adrenals are unremarkable. A small subcentimeter lesion in the left kidney is unchanged, too small to characterize but likely a small cyst. The kidneys are otherwise unremarkable, with no other focal lesions. There are no stones. There is no hydronephrosis or hydroureter. Previously seen herniation of the bladder into the right inguinal canal has been surgically repaired, likely accounting for the fat stranding in the right lower pelvis. Stomach/Bowel: The stomach is unremarkable  there is no evidence of bowel obstruction. There is extensive colonic diverticulosis without evidence of acute diverticulitis. Lymphatic: There is no abdominal or pelvic lymphadenopathy. Reproductive: The prostate and seminal vesicles are unremarkable. Other: There is no ascites or free air. Musculoskeletal: There are mild degenerative changes at L5-S1. There is no acute osseous abnormality or aggressive osseous lesion. IMPRESSION: 1. No evidence of active GI bleed. 2. Colonic diverticulosis without evidence of acute diverticulitis. 3. Interval right inguinal hernia repair. Electronically Signed   By: Valetta Mole M.D.   On: 12/16/2020 11:25      Labs: BNP (last 3 results) Recent Labs    12/16/20 0946  BNP 69.6   Basic Metabolic Panel: Recent Labs  Lab 12/16/20 0937 12/18/20 0459  NA 138 141  K 3.8 3.8  CL 101 107  CO2 30 29  GLUCOSE 269* 104*  BUN 10 5*   CREATININE 0.71 0.70  CALCIUM 9.0 8.8*  MG  --  2.1   Liver Function Tests: Recent Labs  Lab 12/16/20 0937  AST 19  ALT 18  ALKPHOS 48  BILITOT 0.4  PROT 7.0  ALBUMIN 4.2   No results for input(s): LIPASE, AMYLASE in the last 168 hours. No results for input(s): AMMONIA in the last 168 hours. CBC: Recent Labs  Lab 12/16/20 0937 12/16/20 1600 12/16/20 2359 12/17/20 0732 12/18/20 0459  WBC 5.8  --   --   --  5.1  HGB 11.7* 10.4* 10.2* 10.5* 11.3*  HCT 33.5* 29.5* 29.0* 29.2* 31.6*  MCV 92.8  --   --   --  92.7  PLT 196  --   --   --  202   Cardiac Enzymes: No results for input(s): CKTOTAL, CKMB, CKMBINDEX, TROPONINI in the last 168 hours. BNP: Invalid input(s): POCBNP CBG: Recent Labs  Lab 12/17/20 1242 12/17/20 1639 12/17/20 2038 12/18/20 0738 12/18/20 1121  GLUCAP 199* 100* 95 114* 100*   D-Dimer No results for input(s): DDIMER in the last 72 hours. Hgb A1c No results for input(s): HGBA1C in the last 72 hours. Lipid Profile No results for input(s): CHOL, HDL, LDLCALC, TRIG, CHOLHDL, LDLDIRECT in the last 72 hours. Thyroid function studies No results for input(s): TSH, T4TOTAL, T3FREE, THYROIDAB in the last 72 hours.  Invalid input(s): FREET3 Anemia work up No results for input(s): VITAMINB12, FOLATE, FERRITIN, TIBC, IRON, RETICCTPCT in the last 72 hours. Urinalysis    Component Value Date/Time   COLORURINE COLORLESS (A) 10/16/2020 1342   APPEARANCEUR CLEAR (A) 10/16/2020 1342   LABSPEC 1.025 10/16/2020 1342   PHURINE 7.0 10/16/2020 1342   GLUCOSEU NEGATIVE 10/16/2020 1342   HGBUR NEGATIVE 10/16/2020 1342   [place] 10/16/2020 1342   KETONESUR NEGATIVE 10/16/2020 1342   PROTEINUR NEGATIVE 10/16/2020 1342   NITRITE NEGATIVE 10/16/2020 1342   LEUKOCYTESUR NEGATIVE 10/16/2020 1342   Sepsis Labs Invalid input(s): PROCALCITONIN,  WBC,  LACTICIDVEN Microbiology Recent Results (from the past 240 hour(s))  Resp Panel by RT-PCR (Flu A&B,  Covid) Nasopharyngeal Swab     Status: None   Collection Time: 12/16/20 11:53 AM   Specimen: Nasopharyngeal Swab; Nasopharyngeal(NP) swabs in vial transport medium  Result Value Ref Range Status   SARS Coronavirus 2 by RT PCR NEGATIVE NEGATIVE Final    Comment: (NOTE) SARS-CoV-2 target nucleic acids are NOT DETECTED.  The SARS-CoV-2 RNA is generally detectable in upper respiratory specimens during the acute phase of infection. The lowest concentration of SARS-CoV-2 viral copies this assay can detect is 138 copies/mL. A  negative result does not preclude SARS-Cov-2 infection and should not be used as the sole basis for treatment or other patient management decisions. A negative result may occur with  improper specimen collection/handling, submission of specimen other than nasopharyngeal swab, presence of viral mutation(s) within the areas targeted by this assay, and inadequate number of viral copies(<138 copies/mL). A negative result must be combined with clinical observations, patient history, and epidemiological information. The expected result is Negative.  Fact Sheet for Patients:  EntrepreneurPulse.com.au  Fact Sheet for Healthcare Providers:  IncredibleEmployment.be  This test is no t yet approved or cleared by the [place] FDA and  has been authorized for detection and/or diagnosis of SARS-CoV-2 by FDA under an Emergency Use Authorization (EUA). This EUA will remain  in effect (meaning this test can be used) for the duration of the COVID-19 declaration under Section 564(b)(1) of the Act, 21 U.S.C.section 360bbb-3(b)(1), unless the authorization is terminated  or revoked sooner.       Influenza A by PCR NEGATIVE NEGATIVE Final   Influenza B by PCR NEGATIVE NEGATIVE Final    Comment: (NOTE) The Xpert Xpress SARS-CoV-2/FLU/RSV plus assay is intended as an aid in the diagnosis of influenza from Nasopharyngeal swab specimens and should  not be used as a sole basis for treatment. Nasal washings and aspirates are unacceptable for Xpert Xpress SARS-CoV-2/FLU/RSV testing.  Fact Sheet for Patients: EntrepreneurPulse.com.au  Fact Sheet for Healthcare Providers: IncredibleEmployment.be  This test is not yet approved or cleared by the [place] FDA and has been authorized for detection and/or diagnosis of SARS-CoV-2 by FDA under an Emergency Use Authorization (EUA). This EUA will remain in effect (meaning this test can be used) for the duration of the COVID-19 declaration under Section 564(b)(1) of the Act, 21 U.S.C. section 360bbb-3(b)(1), unless the authorization is terminated or revoked.  Performed at [facility], [address] [place],  [address]      Total time spend on discharging this patient, including the last patient exam, discussing the hospital stay, instructions for ongoing care as it relates to all pertinent caregivers, as well as preparing the medical discharge records, prescriptions, and/or referrals as applicable, is 45 minutes.    Enzo Bi, MD  [facility] 12/18/2020, 1:57 PM

## 2020-12-18 NOTE — Progress Notes (Signed)
AVS reviewed, pt family verbalized understanding of all instructions. NAD noted or voiced concerns at this time.

## 2020-12-18 NOTE — Anesthesia Preprocedure Evaluation (Addendum)
Anesthesia Evaluation  Patient identified by MRN, date of birth, ID band Patient awake    Reviewed: Allergy & Precautions, NPO status , Patient's Chart, lab work & pertinent test results  History of Anesthesia Complications Negative for: history of anesthetic complications  Airway Mallampati: IV   Neck ROM: Full    Dental   Missing few molars:   Pulmonary neg pulmonary ROS,    Pulmonary exam normal breath sounds clear to auscultation       Cardiovascular hypertension, + CAD (s/p MI and stents on Plavix, held this admission) and +CHF (ischemic cardiomyopathy, EF 45%)  Normal cardiovascular exam Rhythm:Regular Rate:Normal  ECG 10/25/20: no change from prior   Neuro/Psych negative neurological ROS     GI/Hepatic GI bleed   Endo/Other  Prediabetes   Renal/GU negative Renal ROS     Musculoskeletal   Abdominal   Peds  Hematology negative hematology ROS (+)   Anesthesia Other Findings Reviewed 10/25/20 cardiology note.  Reproductive/Obstetrics                            Anesthesia Physical Anesthesia Plan  ASA: 3 and emergent  Anesthesia Plan: General   Post-op Pain Management:    Induction: Intravenous  PONV Risk Score and Plan: 2 and Propofol infusion, TIVA and Treatment may vary due to age or medical condition  Airway Management Planned: Natural Airway  Additional Equipment:   Intra-op Plan:   Post-operative Plan:   Informed Consent: I have reviewed the patients History and Physical, chart, labs and discussed the procedure including the risks, benefits and alternatives for the proposed anesthesia with the patient or authorized representative who has indicated his/her understanding and acceptance.     Plan/risks discussed with: in [place].  Plan Discussed with: CRNA  Anesthesia Plan Comments:         Anesthesia Quick Evaluation

## 2020-12-18 NOTE — Telephone Encounter (Signed)
Received updated proof of income.  Patient eligible to receive medication assistance at [facility] until time for re-certification in 2023, and as long as eligibility requirements continue to be met.  Lucas [name] J. Lucas [name] Care Manager [facility]  

## 2020-12-18 NOTE — Op Note (Signed)
[facility] Gastroenterology Patient Name: Lucas [name] Procedure Date: 12/18/2020 12:27 PM MRN: [id number] Account #: [id number] Date of Birth: 10-07-1955 Admit Type: Inpatient Age: 65 Room: [facility] ENDO ROOM 3 Gender: Male Note Status: Finalized Instrument Name: Michaelle Birks [id number] Procedure:             Upper GI endoscopy Indications:           Abdominal pain in the right lower quadrant, Acute post                         hemorrhagic anemia Providers:             Benay Pike. Sri Clegg MD, MD Medicines:             Propofol per Anesthesia Complications:         No immediate complications. Procedure:             Pre-Anesthesia Assessment:                        - The risks and benefits of the procedure and the                         sedation options and risks were discussed with the                         patient. All questions were answered and informed                         consent was obtained.                        - Patient identification and proposed procedure were                         verified prior to the procedure by the nurse. The                         procedure was verified in the procedure room.                        - ASA Grade Assessment: III - A patient with severe                         systemic disease.                        - After reviewing the risks and benefits, the patient                         was deemed in satisfactory condition to undergo the                         procedure.                        After obtaining informed consent, the endoscope was                         passed under direct vision. Throughout the procedure,  the patient's blood pressure, pulse, and oxygen                         saturations were monitored continuously. The Endoscope                         was introduced through the mouth, and advanced to the                         third part of duodenum. The upper GI  endoscopy was                         accomplished without difficulty. The patient tolerated                         the procedure well. Findings:      The esophagus was normal.      Scattered mild inflammation characterized by erosions was found in the       entire examined stomach.      A few localized erosions without bleeding were found in the duodenal       bulb.      The second portion of the duodenum and third portion of the duodenum       were normal.      The exam was otherwise without abnormality. Impression:            - Normal esophagus.                        - Gastritis.                        - Duodenal erosions without bleeding.                        - Normal second portion of the duodenum and third                         portion of the duodenum.                        - The examination was otherwise normal.                        - No specimens collected. Recommendation:        - Stool H Pylori Ag                        - Proceed with colonoscopy Procedure Code(s):     --- Professional ---                        [phone], Esophagogastroduodenoscopy, flexible,                         transoral; diagnostic, including collection of                         specimen(s) by brushing or washing, when performed                         (separate procedure) Diagnosis  Code(s):     --- Professional ---                        D62, Acute posthemorrhagic anemia                        R10.31, Right lower quadrant pain                        K26.9, Duodenal ulcer, unspecified as acute or                         chronic, without hemorrhage or perforation                        K29.70, Gastritis, unspecified, without bleeding CPT copyright 2019 American Medical Association. All rights reserved. The codes documented in this report are preliminary and upon coder review may  be revised to meet current compliance requirements. Efrain Sella MD, MD 12/18/2020 12:35:55 PM This report  has been signed electronically. Number of Addenda: 0 Note Initiated On: 12/18/2020 12:27 PM Estimated Blood Loss:  Estimated blood loss: none.      [facility]

## 2020-12-19 ENCOUNTER — Encounter: Payer: Self-pay | Admitting: Internal Medicine

## 2021-01-08 ENCOUNTER — Ambulatory Visit: Payer: Self-pay | Admitting: Gerontology

## 2021-01-14 NOTE — Progress Notes (Deleted)
Cardiology Office Note    Date:  01/14/2021   ID:  Paola Palestino Assunta Found 23-May-1955, MRN [id number]  PCP:  [name], MD  Cardiologist:  Nelva Bush, MD  Electrophysiologist:  None   Chief Complaint: Follow up  History of Present Illness:   Lucas [name] is a 65 y.o. male with history of CAD with late presenting anterior STEMI in 01/2017 status post PCI to the proximal LAD, chronic combined systolic and diastolic CHF, ICM, borderline diabetes, HTN, and HLD who presents for follow up of his CAD and cardiomyopathy.   He was admitted to the hospital in 01/2017 with a late presenting anterior STEMI.  He underwent successful PCI/DES to the proximal LAD.  Otherwise, there was mild to moderate nonobstructive disease involving the mid to distal LAD and RCA.  Echo at that time showed an EF of 30 to 35% with severe hypokinesis of the anteroseptal, anterior, and apical myocardium.  There was grade 1 diastolic dysfunction and mild mitral regurgitation.  With medical therapy and PCI, repeat echo in 05/2017 showed an improvement in his LV systolic function with an EF of 40 to 45%, hypokinesis of the anteroseptal and apical myocardium, grade 1 diastolic dysfunction, mild mitral regurgitation, normal RV systolic function, and normal PASP.  He underwent repeat LHC in 11/2017 in the context of an abnormal ETT which demonstrated widely patent LAD stent with no significant restenosis.  There was mild proximal RCA disease that was not significantly different from cardiac cath in 01/2017.  Mildly reduced LV systolic function with an EF of 45% with mid to distal anterior wall hypokinesis.  Medical therapy was recommended for underlying CAD and ICM.   He was evaluated in the [facility] ED on 03/21/4172 with periumbilical discomfort.  Imaging demonstrated a right inguinal hernia that contained a portion of the bladder in addition to fat.  He subsequently underwent robotic assisted hernia repair in  11/2020.   He was admitted to the hospital from 10/16 through 12/18/2020 with GI bleeding felt to be secondary to diverticulosis on colonoscopy.  EGD demonstrated gastritis.  High-sensitivity troponin was negative.  During his admission, he underwent echo which demonstrated an EF of 35 to 08%, grade 2 diastolic dysfunction, severe hypokinesis of the mid apical anterior wall, anteroseptal wall, and apical segment, normal RV systolic function and ventricular cavity size, mildly dilated left atrium, and trivial mitral regurgitation.  Hemoglobin trended from 16.2-11.7.  Aspirin and Plavix were briefly held, though resumed at discharge, per IM/GI.  At discharge, Entresto and spironolactone were held secondary to soft BP.  ***     Labs independently reviewed:  10/2020 - Hgb 16.1, PLT 190, potassium 3.8, BUN 10, serum creatinine 0.81, albumin 4.0, AST/ALT normal 11/2019 - A1c 6.2, TC 145, TG 215, HDL 30, LDL 79    Past Medical History:  Diagnosis Date   Acute ST elevation myocardial infarction (STEMI) involving left anterior descending (LAD) coronary artery ([place])    a. 01/2017 Ant STEMI/PCI: LAD 20ost, 100p (3.0x23 Xience Sierra DES).   CAD (coronary artery disease)    a. 01/2017 Ant STEMI/PCI: LM 20d, LAD 20ost, 100p (3.0x23 Xience Sierra DES), 60m, D1 50ost, LCX nl, RCA 30p; b. 11/2017 MV: Intermediate risk w/ 92mm inf ST dep; c. 11/2017 Cath: LM nl, LAD patent prox stent, D1 20ost, LCX nl, RCA 30p. EF 45-50%.   Chronic anticoagulation    DAPT (ASA + clopidogrel)   Chronic combined systolic (congestive) and diastolic (congestive) heart failure ([place])  a. 01/2018 Echo: EF 30-35%, sev antsept, ant, apical HK. Gr1 DD, mild MR.   Essential hypertension    Hyperlipidemia    a. Myalgias with high dose lipitor - tolerating 40mg  daily.   Ischemic cardiomyopathy    a. 01/2018 Echo: EF 30-35%.   Type II diabetes mellitus ([place])     Past Surgical History:  Procedure Laterality Date   APPENDECTOMY      COLONOSCOPY N/A 12/18/2020   Procedure: COLONOSCOPY;  Surgeon: Toledo, Benay Pike, MD;  Location: [facility] ENDOSCOPY;  Service: Gastroenterology;  Laterality: N/A;   CORONARY/GRAFT ACUTE MI REVASCULARIZATION N/A 01/25/2017   Procedure: Coronary/Graft Acute MI Revascularization;  Surgeon: Nelva Bush, MD;  Location: [place] CV LAB;  Service: Cardiovascular;  Laterality: N/A;   ESOPHAGOGASTRODUODENOSCOPY N/A 12/18/2020   Procedure: ESOPHAGOGASTRODUODENOSCOPY (EGD);  Surgeon: Toledo, Benay Pike, MD;  Location: [facility] ENDOSCOPY;  Service: Gastroenterology;  Laterality: N/A;   LEFT HEART CATH AND CORONARY ANGIOGRAPHY N/A 01/25/2017   Procedure: LEFT HEART CATH AND CORONARY ANGIOGRAPHY;  Surgeon: Nelva Bush, MD;  Location: [place] CV LAB;  Service: Cardiovascular;  Laterality: N/A;   LEFT HEART CATH AND CORONARY ANGIOGRAPHY N/A 11/20/2017   Procedure: LEFT HEART CATH AND CORONARY ANGIOGRAPHY;  Surgeon: Wellington Hampshire, MD;  Location: [place] CV LAB;  Service: Cardiovascular;  Laterality: N/A;   XI ROBOTIC ASSISTED INGUINAL HERNIA REPAIR WITH MESH Right 11/06/2020   Procedure: XI ROBOTIC ASSISTED INGUINAL HERNIA REPAIR WITH MESH;  Surgeon: Olean Ree, MD;  Location: [facility] ORS;  Service: General;  Laterality: Right;    Current Medications: No outpatient medications have been marked as taking for the 01/15/21 encounter (Appointment) with Rise Mu, PA-C.    Allergies:   Patient has no known allergies.   Social History   Socioeconomic History   Marital status: Married    Spouse name: Not on file   Number of children: Not on file   Years of education: Not on file   Highest education level: Not on file  Occupational History   Occupation: unemployed  Tobacco Use   Smoking status: Never   Smokeless tobacco: Never  Vaping Use   Vaping Use: Never used  Substance and Sexual Activity   Alcohol use: No   Drug use: No   Sexual activity: Yes    Birth control/protection:  Coitus interruptus, None  Other Topics Concern   Not on file  Social History Narrative   Not on file   Social Determinants of Health   Financial Resource Strain: Not on file  Food Insecurity: No Food Insecurity   Worried About Charity fundraiser in the Last Year: Never true   [place] in the Last Year: Never true  Transportation Needs: No Transportation Needs   Lack of Transportation (Medical): No   Lack of Transportation (Non-Medical): No  Physical Activity: Not on file  Stress: Not on file  Social Connections: Not on file     Family History:  The patient's family history includes Arrhythmia in his father; Asthma in his father and mother; Healthy in his sister; Heart disease in his father.  ROS:   ROS   EKGs/Labs/Other Studies Reviewed:    Studies reviewed were summarized above. The additional studies were reviewed today:  LHC 01/25/2017: Conclusions: Late-presenting anterior STEMI with acute plaque rupture and 100% thrombotic occlusion of the proximal LAD (type 1 MI). Mild to moderate, non-obstructive disease involving the mid/distal LAD and RCA. Moderately to severely reduced left ventricular contraction. Moderately elevated  left ventricular filling pressure.   Recommendations: Admit to ICU; patient will need to remain hospitalized at least 48 hours, given large anterior MI. Dual antiplatelet therapy with aspirin and ticagrelor for at least 12 months, ideally longer. Aggressive secondary prevention, including high-intensity statin therapy. Obtain transthoracic echocardiogram to better assess LVEF. If LVEF < 35%, I recommend LifeVest on discharge given large anterior MI. Gentle diuresis, given elevated LVEDP. Initiate carvedilol 3.125 mg BID with uptitration as tolerated. ACEI/ARB +/- aldosterone antagonist should be added prior to discharge based on renal function. __________   2D echo 01/26/2017: - Left ventricle: The cavity size was normal. There was  moderate    concentric hypertrophy. Systolic function was moderately to    severely reduced. The estimated ejection fraction was in the    range of 30% to 35%. There is severe hypokinesis of the    anteroseptal, anterior, and apical myocardium. Doppler parameters    are consistent with abnormal left ventricular relaxation (grade 1    diastolic dysfunction).  - Mitral valve: There was mild regurgitation. __________   2D echo 05/29/2017: - Left ventricle: The cavity size was normal. Systolic function was    mildly to moderately reduced. The estimated ejection fraction was    in the range of 40% to 45%. Hypokinesis of the anterior    myocardium. Hypokinesis of the anteroseptal myocardium.    Hypokinesis of the apical myocardium. Doppler parameters are    consistent with abnormal left ventricular relaxation (grade 1    diastolic dysfunction).  - Mitral valve: There was mild regurgitation.  - Left atrium: The atrium was normal in size.  - Right ventricle: Systolic function was normal.  - Pulmonary arteries: Systolic pressure was within the normal    range. __________   ETT 11/06/2017: Baseline EKG demonstrates normal sinus rhythm with anteroseptal Q waves and non-specific T wave abnormality. Patient demonstrates good exercise capacity with normal heart rate and blood pressure responses. Neck and jaw discomfort noted during peak stress and into early recovery. Horizontal ST segment depression ST segment depression of 1 mm was noted during stress in the II, III and aVF leads. No significant arrhythmia was observed. Intermediate risk study (Duke Treadmill Score = 0).   Abnormal, intermediate risk exercise tolerance test with mild ST depression during stress and neck/jaw pain, concerning for anginal equivalent. __________   [facility] 11/20/2017: Previously placed Prox LAD drug eluting stent is widely patent. Balloon angioplasty was performed. Ost 1st Diag lesion is 20% stenosed. Prox RCA lesion  is 30% stenosed. There is mild left ventricular systolic dysfunction. LV end diastolic pressure is mildly elevated. The left ventricular ejection fraction is 45-50% by visual estimate.   1.  Widely patent LAD stent with no significant restenosis.  Mild proximal RCA disease not different from cardiac catheterization last year. 2.  Mildly reduced LV systolic function with an EF of 45% with mid to distal anterior wall hypokinesis.  Mildly elevated left ventricular end-diastolic pressure.   Recommendations: Continue medical therapy for coronary artery disease and ischemic cardiomyopathy. The patient can be discharged home from a cardiac standpoint. __________  2D echo 12/17/2020:  1. Left ventricular ejection fraction, by estimation, is 35 to 40%. The  left ventricle has moderately decreased function. The left ventricle  demonstrates regional wall motion abnormalities (see scoring  diagram/findings for description). Left ventricular   diastolic parameters are consistent with Grade II diastolic dysfunction  (pseudonormalization). There is severe hypokinesis of the left  ventricular, mid-apical anterior wall, anteroseptal wall  and apical  segment.   2. Right ventricular systolic function is normal. The right ventricular  size is normal.   3. Left atrial size was mildly dilated.   4. The mitral valve is grossly normal. Trivial mitral valve  regurgitation. No evidence of mitral stenosis.   5. The aortic valve was not well visualized. Aortic valve regurgitation  is not visualized. No aortic stenosis is present.   EKG:  EKG is ordered today.  The EKG ordered today demonstrates ***  Recent Labs: 12/16/2020: ALT 18; B Natriuretic Peptide 13.8 12/18/2020: BUN 5; Creatinine, Ser 0.70; Hemoglobin 11.3; Magnesium 2.1; Platelets 202; Potassium 3.8; Sodium 141  Recent Lipid Panel    Component Value Date/Time   CHOL 145 11/02/2019 1007   TRIG 215 (H) 11/02/2019 1007   HDL 30 (L) 11/02/2019 1007    CHOLHDL 4.8 11/02/2019 1007   CHOLHDL 2.8 05/11/2017 1526   VLDL 23 05/11/2017 1526   LDLCALC 79 11/02/2019 1007   LDLDIRECT 33 02/16/2018 0807    PHYSICAL EXAM:    VS:  There were no vitals taken for this visit.  BMI: There is no height or weight on file to calculate BMI.  Physical Exam  Wt Readings from Last 3 Encounters:  12/18/20 148 lb 5.9 oz (67.3 kg)  11/27/20 151 lb (68.5 kg)  11/06/20 144 lb (65.3 kg)     ASSESSMENT & PLAN:   CAD involving the native coronary arteries without***angina:  Chronic combined systolic and diastolic CHF/ICM:  HTN: Blood pressure ***  HLD: LDL 79 in 11/2019 with goal LDL less than 70.  GI bleed:  Language barrier:   {Are you ordering a CV Procedure (e.g. stress test, cath, DCCV, TEE, etc)?   Press F2        :[id number]}     Disposition: F/u with Dr. Saunders Revel or an APP in ***.   Medication Adjustments/Labs and Tests Ordered: Current medicines are reviewed at length with the patient today.  Concerns regarding medicines are outlined above. Medication changes, Labs and Tests ordered today are summarized above and listed in the Patient Instructions accessible in Encounters.   Signed, Christell Faith, PA-C 01/14/2021 7:29 AM     [place] [address] [place], [place] [address] [phone]

## 2021-01-15 ENCOUNTER — Ambulatory Visit: Payer: Self-pay | Admitting: Physician Assistant

## 2021-01-16 ENCOUNTER — Encounter (HOSPITAL_COMMUNITY): Payer: Self-pay | Admitting: Radiology

## 2021-01-16 ENCOUNTER — Encounter: Payer: Self-pay | Admitting: Physician Assistant

## 2021-07-05 ENCOUNTER — Other Ambulatory Visit: Payer: Self-pay

## 2021-08-14 ENCOUNTER — Other Ambulatory Visit: Payer: Self-pay

## 2021-08-14 ENCOUNTER — Encounter: Payer: Self-pay | Admitting: Gerontology

## 2021-08-14 ENCOUNTER — Ambulatory Visit: Payer: Self-pay | Admitting: Gerontology

## 2021-08-14 VITALS — BP 119/71 | HR 83 | Temp 98.6°F | Resp 16 | Ht 60.0 in | Wt 149.4 lb

## 2021-08-14 DIAGNOSIS — Z0000 Encounter for general adult medical examination without abnormal findings: Secondary | ICD-10-CM

## 2021-08-14 DIAGNOSIS — I10 Essential (primary) hypertension: Secondary | ICD-10-CM

## 2021-08-14 DIAGNOSIS — R7303 Prediabetes: Secondary | ICD-10-CM

## 2021-08-14 LAB — POCT GLYCOSYLATED HEMOGLOBIN (HGB A1C): Hemoglobin A1C: 6.2 % — AB (ref 4.0–5.6)

## 2021-08-14 LAB — GLUCOSE, POCT (MANUAL RESULT ENTRY): POC Glucose: 145 mg/dl — AB (ref 70–99)

## 2021-08-14 MED ORDER — METFORMIN HCL 500 MG PO TABS
500.0000 mg | ORAL_TABLET | Freq: Two times a day (BID) | ORAL | 5 refills | Status: DC
Start: 2021-08-14 — End: 2023-12-21
  Filled 2021-08-14 (×2): qty 60, 30d supply, fill #0
  Filled 2021-09-19: qty 60, 30d supply, fill #1
  Filled 2021-11-25: qty 60, 30d supply, fill #2
  Filled 2022-07-03: qty 60, 30d supply, fill #3

## 2021-08-14 NOTE — Progress Notes (Signed)
Patient visit completed today with [facility] interpreter services, id # [phone]

## 2021-08-14 NOTE — Patient Instructions (Signed)
Plan de alimentacin DASH DASH Eating Plan DASH es la sigla en ingls de "Enfoques Alimentarios para Detener la Hipertensin". El plan de alimentacin DASH ha demostrado: [place]ajar la presin arterial elevada (hipertensin). Reducir el riesgo de diabetes tipo 2, enfermedad cardaca y accidente cerebrovascular. Ayudar a perder peso. Consejos para seguir este plan Leer las etiquetas de los alimentos Verifique la cantidad de sal (sodio) por porcin en las etiquetas de los alimentos. Elija alimentos con menos del 5 por ciento del valor diario de sodio. Generalmente, los alimentos con menos de 300 miligramos (mg) de sodio por porcin se encuadran dentro de este plan alimentario. Para encontrar cereales integrales, busque la palabra "integral" como primera palabra en la lista de ingredientes. Al ir de compras Compre productos en los que en su etiqueta diga: "bajo contenido de sodio" o "sin agregado de sal". Compre alimentos frescos. Evite los alimentos enlatados y comidas precocidas o congeladas. Al cocinar Evite agregar sal cuando cocine. Use hierbas o aderezos sin sal, en lugar de sal de mesa o sal marina. Consulte al mdico o farmacutico antes de usar sustitutos de la sal. No fra los alimentos. A la hora de cocinar los alimentos opte por hornearlos, hervirlos, grillarlos, asarlos al horno y asarlos a la parrilla. Cocine con aceites cardiosaludables, como oliva, canola, aguacate, soja o girasol. Planificacin de las comidas  Consuma una dieta equilibrada, que incluya lo siguiente: 4 o ms porciones de frutas y 4 o ms porciones de verduras por da. Trate de que medio plato de cada comida sea de frutas y verduras. De 6 a 8 porciones de cereales integrales todos los das. Menos de 6 onzas (170 g) de carne, aves o pescado magros por da. Una porcin de 3 onzas (85 g) de carne tiene casi el mismo tamao que un mazo de cartas. Un huevo equivale a 1 onza (28 g). De 2 a 3 porciones de productos lcteos  descremados por da. Una porcin es 1 taza (237 ml). 1 porcin de frutos secos, semillas o frijoles 5 veces por semana. De 2 a 3 porciones de grasas cardiosaludables. Las grasas saludables llamadas cidos grasos omega-3 se encuentran en alimentos como las nueces, las semillas de lino, las leches fortificadas y los huevos. Estas grasas tambin se encuentran en los pescados de agua fra, como la sardina, el salmn y la caballa. Limite la cantidad que consume de: Alimentos enlatados o envasados. Alimentos con alto contenido de grasa trans, como algunos alimentos fritos. Alimentos con alto contenido de grasa saturada, como carne con grasa. Postres y otros dulces, bebidas azucaradas y otros alimentos con azcar agregada. Productos lcteos enteros. No le agregue sal a los alimentos antes de probarlos. No coma ms de 4 yemas de huevo por semana. Trate de comer al menos 2 comidas vegetarianas por semana. Consuma ms comida casera y menos de restaurante, de bares y comida rpida. Estilo de vida Cuando coma en un restaurante, pida que preparen su comida con menos sal o, en lo posible, sin nada de sal. Si bebe alcohol: Limite la cantidad que bebe: De 0 a 1 medida por da para las mujeres que no estn embarazadas. De 0 a 2 medidas por da para los hombres. Est atento a la cantidad de alcohol que hay en las bebidas que toma. En los [place], una medida equivale a una botella de cerveza de 12 oz (355 ml), un vaso de vino de 5 oz (148 ml) o un vaso de una bebida alcohlica de alta graduacin de 1 oz (  44 ml). Informacin general Evite ingerir ms de 2300 mg de sal por da. Si tiene hipertensin, es posible que necesite reducir la ingesta de sodio a 1,500 mg por da. Trabaje con su mdico para mantener un peso saludable o perder peso. Pregntele cul es el peso recomendado para usted. Realice al menos 30 minutos de ejercicio que haga que se acelere su corazn (ejercicio aerbico) la mayora de los das  de la semana. Estas actividades pueden incluir caminar, nadar o andar en bicicleta. Trabaje con su mdico o nutricionista para ajustar su plan alimentario a sus necesidades calricas personales. Qu alimentos debo comer? Frutas Todas las frutas frescas, congeladas o disecadas. Frutas enlatadas en jugo natural (sin agregado de azcar). Verduras Verduras frescas o congeladas (crudas, al vapor, asadas o grilladas). Jugos de tomate y verduras con bajo contenido de sodio o reducidos en sodio. Salsa y pasta de tomate con bajo contenido de sodio o reducidas en sodio. Verduras enlatadas con bajo contenido de sodio o reducidas en sodio. Granos Pan de salvado o integral. Pasta de salvado o integral. Arroz integral. Avena. Quinua. Trigo burgol. Cereales integrales y con bajo contenido de sodio. Pan pita. Galletitas de agua con bajo contenido de grasa y sodio. Tortillas de harina integral. Carnes y otras protenas Pollo o pavo sin piel. Carne de pollo o de pavo molida. Cerdo desgrasado. Pescado y mariscos. Claras de huevo. Porotos, guisantes o lentejas secos. Frutos secos, mantequilla de frutos secos y semillas sin sal. Frijoles enlatados sin sal. Cortes de carne vacuna magra, desgrasada. Carne precocida o curada magra y baja en sodio, como embutidos o panes de carne. Lcteos Leche descremada (1 %) o descremada. Quesos reducidos en grasa, con bajo contenido de grasa o descremados. Queso blanco o ricota sin grasa, con bajo contenido de sodio. Yogur semidescremado o descremado. Queso con bajo contenido de grasa y sodio. Grasas y aceites Margarinas untables que no contengan grasas trans. Aceite vegetal. [place] y aderezos para ensaladas livianos, reducidos en grasa o con bajo contenido de grasas (reducidos en sodio). Aceite de canola, crtamo, oliva, aguacate, soja y girasol. Aguacate. Alios y condimentos Hierbas. Especias. Mezclas de condimentos sin sal. Otros alimentos Palomitas de maz y pretzels sin sal.  Dulces con bajo contenido de grasas. Es posible que los productos que se enumeran ms arriba no constituyan una lista completa de los alimentos y las bebidas que puede tomar. Consulte a un nutricionista para obtener ms informacin. Qu alimentos debo evitar? Frutas Fruta enlatada en almbar liviano o espeso. Frutas cocidas en aceite. Frutas con salsa de crema o mantequilla. Verduras Verduras con crema o fritas. Verduras en salsa de queso. Verduras enlatadas regulares (que no sean con bajo contenido de sodio o reducidas en sodio). Pasta y salsa de tomates enlatadas regulares (que no sean con bajo contenido de sodio o reducidas en sodio). Jugos de tomate y verduras regulares (que no sean con bajo contenido de sodio o reducidos en sodio). Pepinillos. Aceitunas. Granos Productos de panificacin hechos con grasa, como medialunas, magdalenas y algunos panes. Comidas con arroz o pasta seca listas para usar. Carnes y otras protenas Cortes de carne con alto contenido de grasa. Costillas. Carne frita. [place]. [place], salame y otras carnes precocidas o curadas, como embutidos o panes de carne. Grasa de la espalda del cerdo (panceta). Salchicha de cerdo. Frutos secos y semillas con sal. Frijoles enlatados con agregado de sal. Pescado enlatado o ahumado. Huevos enteros o yemas. Pollo o pavo con piel. Lcteos Leche entera o al 2 %, crema   y [place] y mitad crema. Queso crema entero o con toda su grasa. Yogur entero o endulzado. Quesos con toda su grasa. Sustitutos de cremas no lcteas. Coberturas batidas. Quesos para untar y quesos procesados. Grasas y [employer]. Margarina en barra. [place]. Lardo. Mantequilla clarificada. Grasa de panceta. Aceites tropicales como aceite de coco, palmiste o palma. Alios y condimentos Sal de cebolla, sal de ajo, sal condimentada, sal de mesa y sal marina. Salsa [place]. Salsa trtara. Salsa barbacoa. Salsa teriyaki. Salsa de soja, incluso la que  tiene contenido reducido de [place]. Salsa de carne. Salsas en lata y envasadas. Salsa de pescado. Salsa de [place]. Salsa rosada. Rbanos picantes comprados en tiendas. [place]. Mostaza. Saborizantes y tiernizantes para carne. Caldo en cubitos. Salsas picantes. Adobos preelaborados o envasados. Aderezos para tacos preelaborados o envasados. Salsas de pepinillos. Aderezos comunes para ensalada. Otros alimentos Palomitas de maz y pretzels con sal. Es posible que los productos que se enumeran ms arriba no constituyan una lista completa de los alimentos y las bebidas que Nurse, adult. Consulte a un nutricionista para obtener ms informacin. Dnde buscar ms informacin National Heart, Lung, and Blood Institute ([place], los Pulmones y Herbalist): https://wilson-eaton.com/ American Heart Association (Asociacin Estadounidense del Corazn): www.heart.org Academy of Nutrition and Dietetics (Academia de Nutricin y Information systems manager): www.eatright.[place]): www.kidney.org Resumen El plan de alimentacin DASH ha demostrado bajar la presin arterial elevada (hipertensin). Tambin puede reducir [employer] de diabetes tipo 2, enfermedad cardaca y accidente cerebrovascular. Cuando siga el plan de alimentacin DASH, trate de comer ms frutas frescas y verduras, cereales integrales, carnes magras, lcteos descremados y grasas cardiosaludables. Con el plan de alimentacin DASH, deber limitar el consumo de sal (sodio) a 2,300 mg por da. Si tiene hipertensin, es posible que necesite reducir la ingesta de sodio a 1,500 mg por da. Trabaje con su mdico o nutricionista para ajustar su plan alimentario a sus necesidades calricas personales. Esta informacin no tiene Marine scientist el consejo del mdico. Asegrese de hacerle al mdico cualquier pregunta que tenga. Document Revised: 03/24/2019 Document Reviewed: 03/24/2019 Elsevier Patient Education   Alligator de carbohidratos para la diabetes mellitus en los adultos Carbohydrate Counting for Diabetes Mellitus, Adult El recuento de carbohidratos es un mtodo para llevar un registro de la cantidad de carbohidratos que se ingieren. La ingesta de carbohidratos aumenta la cantidad de azcar (glucosa) en la sangre. El recuento de la cantidad de carbohidratos que ingiere mejora el control de su glucemia. Esto, a su vez, le ayuda a controlar su diabetes. Los carbohidratos se miden en gramos (g) por porcin. Es importante saber la cantidad de carbohidratos (en gramos o por tamao de porcin) que se puede ingerir en cada comida. Esto es Psychologist, forensic. Un nutricionista puede ayudarlo a crear un plan de alimentacin y a calcular la cantidad de carbohidratos que debe ingerir en cada comida y colacin. Qu alimentos contienen carbohidratos? Los siguientes alimentos incluyen carbohidratos: Granos, como panes y cereales. Frijoles secos y productos con soja. Verduras con almidn, como papas, guisantes y maz. Lambert Mody y jugos de frutas. Leche y Estate agent. Dulces y colaciones, como pasteles, galletas, caramelos, papas fritas de bolsa y refrescos. Cmo se calculan los carbohidratos de los alimentos? Hay dos maneras de calcular los carbohidratos de los alimentos. Puede leer las etiquetas de los alimentos o aprender cules son los tamaos de las porciones estndar de los alimentos. Puede usar cualquiera de estos mtodos  o una combinacin de [place]. Usar la Scientific laboratory technician de informacin nutricional La lista de Informacin nutricional est incluida en las etiquetas de casi todas las bebidas y los alimentos envasados de los [place]. Esto incluye lo siguiente: El tamao de la porcin. Informacin sobre los nutrientes de cada porcin, incluidos los gramos de carbohidratos por porcin. Para usar la Informacin nutricional, decida cuntas porciones tomar. Luego, multiplique el nmero de  porciones por la cantidad de carbohidratos por porcin. El nmero resultante es la cantidad total de carbohidratos que comer. Conocer los tamaos de las porciones estndar de los alimentos Cuando coma alimentos que contengan carbohidratos y que no estn envasados o no incluyan la informacin nutricional en la etiqueta, debe medir las porciones para poder calcular los gramos de carbohidratos. Mida los alimentos que comer con una balanza de alimentos o una taza medidora, si es necesario. Decida cuntas porciones de International aid/development worker. Multiplique el nmero de porciones por 15. En los alimentos que contienen carbohidratos, una porcin [place] a 15 g de carbohidratos. Por ejemplo, si come 2 tazas o 10 onzas (300 g) de fresas, habr comido 2 porciones y 30 g de carbohidratos (2 porciones x 15 g = 30 g). En el caso de las comidas que contienen mezclas de ms de un alimento, como las sopas y los guisos, debe calcular los carbohidratos de cada alimento que se incluye. La siguiente lista contiene los tamaos de porciones estndar de los alimentos ricos en carbohidratos ms comunes. Cada una de estas porciones tiene aproximadamente 15 g de carbohidratos: 1 rebanada de pan. 1 tortilla de seis pulgadas (15 cm). ? de taza o 2 onzas (53 g) de arroz o pasta cocidos.  taza o 3 onzas (85 g) de lentejas o frijoles cocidos o enlatados, escurridos y enjuagados.  taza o 3 onzas (85 g) de verduras con almidn, como guisantes, maz o zapallo.  taza o 4 onzas (120 g) de cereal caliente.  taza o 3 onzas (85 g) de papas hervidas o en pur, o  o 3 onzas (85 g) de una papa grande al horno.  taza o 4 onzas fluidas (118 ml) de jugo de frutas. 1 taza u 8 onzas fluidas (237 ml) de leche. 1 unidad pequea o 4 onzas (106 g) de manzana.  unidad o 2 onzas (63 g) de una banana mediana. 1 taza o 5 oz (150 g) de fresas. 3 tazas o 1 oz (28.3 g) de palomitas de maz. Cul sera un ejemplo de recuento de  carbohidratos? Para calcular los gramos de carbohidratos de este ejemplo de comida, siga los pasos que se describen a continuacin. Ejemplo de comida 3 onzas (85 g) de pechugas de pollo. ? de taza o 4 onzas (106 g) de arroz integral.  taza o 3 onzas (85 g) de maz. 1 taza u 8 onzas fluidas (237 ml) de leche. 1 taza o 5 onzas (150 g) de fresas con crema batida sin azcar. Clculo de carbohidratos Identifique los alimentos que contienen carbohidratos: Arroz. Maz. Leche. Hughie Closs. Calcule cuntas porciones come de cada alimento: 2 porciones de arroz. 1 porcin de maz. [place]. 1 porcin de fresas. Multiplique cada nmero de porciones por 15 g: 2 porciones de arroz x 15 g = 30 g. 1 porcin de maz x 15 g = 15 g. 1 porcin de leche x 15 g = 15 g. 1 porcin de fresas x 15 g = 15 g. Sume todas las cantidades para conocer el total de gramos de carbohidratos  consumidos: 30 g + 15 g + 15 g + 15 g = 75 g de carbohidratos en total. Consejos para seguir este plan Al ir de compras [place] un plan de comidas y luego haga una lista de compras. Compre verduras frescas y congeladas, frutas frescas y congeladas, productos lcteos, huevos, frijoles, lentejas y cereales integrales. Fjese en las etiquetas de los alimentos. Elija los alimentos que tengan ms fibra y Surveyor, minerals. Evite los alimentos procesados y los alimentos con Nurse, learning disability. Planificacin de las comidas Trate de consumir la misma cantidad de gramos de carbohidratos en cada comida y en cada colacin. Planifique tomar comidas y colaciones regulares y equilibradas. Dnde buscar ms informacin American Diabetes Association (Asociacin Estadounidense de la Diabetes): diabetes.org Centers for Disease Control and Prevention (Centros para el Control y la Prevencin de [place]): StoreMirror.com.cy Academy of Nutrition and Dietetics (Academia de Nutricin y Information systems manager): Loss adjuster, chartered.org Association of Diabetes Care & Education  Specialists (Asociacin de Especialistas en Atencin y Educacin sobre la Diabetes): diabeteseducator.org Resumen El recuento de carbohidratos es un mtodo para llevar un registro de la cantidad de carbohidratos que se ingieren. La ingesta de carbohidratos aumenta la cantidad de azcar (glucosa) en la sangre. El recuento de la cantidad de carbohidratos que ingiere mejora el control de su glucemia. Esto le ayuda a Chief Technology Officer su diabetes. Un nutricionista puede ayudarlo a crear un plan de alimentacin y a calcular la cantidad de carbohidratos que debe ingerir en cada comida y colacin. Esta informacin no tiene Marine scientist el consejo del mdico. Asegrese de hacerle al mdico cualquier pregunta que tenga. Document Revised: 11/08/2019 Document Reviewed: 11/08/2019 Elsevier Patient Education  [place].

## 2021-08-14 NOTE — Progress Notes (Signed)
Established Patient Office Visit  Subjective   Patient ID: Lucas [name], male    DOB: 11/15/1955  Age: 66 y.o. MRN: [id number]  Chief Complaint  Patient presents with   Follow-up   Hypertension   Prediabetes    HPI  Lucas [name] Lucas [name]  is a 66 y/o male who has history of CAD, STEMI, CHF, presents for routine follow up visit and medication refill. His HgbA1c done during visit increased from 5.9% to 6.2%, his blood glucose was 145 mg per DL when checked during visit.  He states that he is compliant with his medications, denies side effects and continues to make healthy lifestyle changes.  Overall, he states that he is doing well, offers no further complaint I will follow-up with his cardiology appointment in July.  Review of Systems  Constitutional: Negative.   Eyes: Negative.   Respiratory: Negative.    Cardiovascular: Negative.   Skin: Negative.   Neurological: Negative.   Endo/Heme/Allergies: Negative.   Psychiatric/Behavioral: Negative.        Objective:     There were no vitals taken for this visit. BP Readings from Last 3 Encounters:  08/14/21 119/71  12/18/20 120/77  11/27/20 121/78   Wt Readings from Last 3 Encounters:  08/14/21 149 lb 6.4 oz (67.8 kg)  12/18/20 148 lb 5.9 oz (67.3 kg)  11/27/20 151 lb (68.5 kg)      Physical Exam HENT:     Head: Normocephalic and atraumatic.     Mouth/Throat:     Mouth: Mucous membranes are moist.  Eyes:     Extraocular Movements: Extraocular movements intact.     Conjunctiva/sclera: Conjunctivae normal.     Pupils: Pupils are equal, round, and reactive to light.  Cardiovascular:     Rate and Rhythm: Normal rate and regular rhythm.     Pulses: Normal pulses.     Heart sounds: Normal heart sounds.  Pulmonary:     Effort: Pulmonary effort is normal.     Breath sounds: Normal breath sounds.  Skin:    General: Skin is warm.  Neurological:     General: No focal deficit present.     Mental Status:  He is alert and oriented to person, place, and time. Mental status is at baseline.  Psychiatric:        Mood and Affect: Mood normal.        Behavior: Behavior normal.        Thought Content: Thought content normal.        Judgment: Judgment normal.      No results found for any visits on 08/14/21.  Last CBC Lab Results  Component Value Date   WBC 5.1 12/18/2020   HGB 11.3 (L) 12/18/2020   HCT 31.6 (L) 12/18/2020   MCV 92.7 12/18/2020   MCH 33.1 12/18/2020   RDW 12.2 12/18/2020   PLT 202 77/82/4235   Last metabolic panel Lab Results  Component Value Date   GLUCOSE 104 (H) 12/18/2020   NA 141 12/18/2020   K 3.8 12/18/2020   CL 107 12/18/2020   CO2 29 12/18/2020   BUN 5 (L) 12/18/2020   CREATININE 0.70 12/18/2020   GFRNONAA >60 12/18/2020   CALCIUM 8.8 (L) 12/18/2020   PROT 7.0 12/16/2020   ALBUMIN 4.2 12/16/2020   LABGLOB 2.6 05/11/2019   AGRATIO 1.7 05/11/2019   BILITOT 0.4 12/16/2020   ALKPHOS 48 12/16/2020   AST 19 12/16/2020   ALT 18 12/16/2020   ANIONGAP 5 12/18/2020  Last lipids Lab Results  Component Value Date   CHOL 145 11/02/2019   HDL 30 (L) 11/02/2019   LDLCALC 79 11/02/2019   LDLDIRECT 33 02/16/2018   TRIG 215 (H) 11/02/2019   CHOLHDL 4.8 11/02/2019   Last hemoglobin A1c Lab Results  Component Value Date   HGBA1C 6.2 (A) 08/14/2021   Last thyroid functions Lab Results  Component Value Date   TSH 3.160 08/19/2018      The ASCVD Risk score (Arnett DK, et al., 2019) failed to calculate for the following reasons:   The patient has a prior MI or stroke diagnosis    Assessment & Plan:    1. Prediabetes His hemoglobin A1c was 6.2%, he will continue on metformin 500 mg twice daily, low carbohydrate/non concentrated sweet diet and exercise as tolerated. - POCT HgB A1C - POCT Glucose (CBG) - metFORMIN (GLUCOPHAGE) 500 MG tablet; Take 1 tablet (500 mg total) by mouth 2 (two) times daily with a meal.  Dispense: 60 tablet; Refill: 5 - HgB  A1c; Future  2. Essential hypertension -His blood pressure is under control and he will continue his current medication, DASH diet and exercise as tolerated. He was encouraged to follow-up with his cardiology appointment in July.  3. Health care maintenance -Routine labs will be checked. - Lipid panel; Future - CBC w/Diff; Future - Comp Met (CMET); Future   He has no follow-up appointment scheduled because he has active healthcare insurance.  [facility] wishes him well with his care.   Chioma E Iloabachie, NP  

## 2021-09-19 ENCOUNTER — Other Ambulatory Visit: Payer: Self-pay

## 2021-09-26 ENCOUNTER — Other Ambulatory Visit: Payer: Self-pay | Admitting: Family

## 2021-11-05 ENCOUNTER — Other Ambulatory Visit: Payer: Self-pay

## 2021-11-05 ENCOUNTER — Other Ambulatory Visit: Payer: Self-pay | Admitting: Family

## 2021-11-05 DIAGNOSIS — I5022 Chronic systolic (congestive) heart failure: Secondary | ICD-10-CM

## 2021-11-05 MED ORDER — SACUBITRIL-VALSARTAN 24-26 MG PO TABS
ORAL_TABLET | ORAL | 0 refills | Status: DC
  Filled 2021-11-05: qty 60, 30d supply, fill #0
  Filled 2022-06-24: qty 60, 30d supply, fill #1
  Filled 2022-07-23: qty 60, 30d supply, fill #2

## 2021-11-25 ENCOUNTER — Other Ambulatory Visit: Payer: Self-pay

## 2021-12-11 ENCOUNTER — Other Ambulatory Visit: Payer: Self-pay

## 2021-12-18 ENCOUNTER — Ambulatory Visit: Payer: Self-pay | Admitting: Gerontology

## 2022-01-27 DIAGNOSIS — H9192 Unspecified hearing loss, left ear: Secondary | ICD-10-CM | POA: Diagnosis not present

## 2022-01-27 DIAGNOSIS — H6982 Other specified disorders of Eustachian tube, left ear: Secondary | ICD-10-CM | POA: Diagnosis not present

## 2022-05-22 DIAGNOSIS — R0982 Postnasal drip: Secondary | ICD-10-CM | POA: Diagnosis not present

## 2022-05-22 DIAGNOSIS — Z9109 Other allergy status, other than to drugs and biological substances: Secondary | ICD-10-CM | POA: Diagnosis not present

## 2022-06-24 ENCOUNTER — Other Ambulatory Visit: Payer: Self-pay

## 2022-07-03 ENCOUNTER — Other Ambulatory Visit: Payer: Self-pay

## 2022-07-17 ENCOUNTER — Other Ambulatory Visit: Payer: Self-pay | Admitting: Gerontology

## 2022-07-17 ENCOUNTER — Other Ambulatory Visit: Payer: Self-pay

## 2022-07-17 NOTE — Telephone Encounter (Signed)
Patient needs OV as long as insurance status hasn't changed and patient is still a resident of [place]. Last OV was 08/14/21

## 2022-07-18 ENCOUNTER — Other Ambulatory Visit: Payer: Self-pay

## 2022-07-23 ENCOUNTER — Other Ambulatory Visit: Payer: Self-pay

## 2022-09-24 ENCOUNTER — Telehealth: Payer: Self-pay

## 2022-09-24 NOTE — Telephone Encounter (Signed)
Called both numbers in chart. Both numbers are unavailable. Need new numbers

## 2022-10-10 DIAGNOSIS — Z1389 Encounter for screening for other disorder: Secondary | ICD-10-CM | POA: Diagnosis not present

## 2022-10-10 DIAGNOSIS — I252 Old myocardial infarction: Secondary | ICD-10-CM | POA: Diagnosis not present

## 2022-10-10 DIAGNOSIS — I509 Heart failure, unspecified: Secondary | ICD-10-CM | POA: Diagnosis not present

## 2022-10-10 DIAGNOSIS — I10 Essential (primary) hypertension: Secondary | ICD-10-CM | POA: Diagnosis not present

## 2022-10-10 DIAGNOSIS — Z0131 Encounter for examination of blood pressure with abnormal findings: Secondary | ICD-10-CM | POA: Diagnosis not present

## 2022-10-10 DIAGNOSIS — R053 Chronic cough: Secondary | ICD-10-CM | POA: Diagnosis not present

## 2022-11-14 ENCOUNTER — Other Ambulatory Visit: Payer: Self-pay

## 2022-11-21 DIAGNOSIS — Z0130 Encounter for examination of blood pressure without abnormal findings: Secondary | ICD-10-CM | POA: Diagnosis not present

## 2022-11-21 DIAGNOSIS — Z1389 Encounter for screening for other disorder: Secondary | ICD-10-CM | POA: Diagnosis not present

## 2022-11-21 DIAGNOSIS — E119 Type 2 diabetes mellitus without complications: Secondary | ICD-10-CM | POA: Diagnosis not present

## 2022-11-21 DIAGNOSIS — Z1159 Encounter for screening for other viral diseases: Secondary | ICD-10-CM | POA: Diagnosis not present

## 2022-11-21 DIAGNOSIS — I509 Heart failure, unspecified: Secondary | ICD-10-CM | POA: Diagnosis not present

## 2022-11-21 DIAGNOSIS — I10 Essential (primary) hypertension: Secondary | ICD-10-CM | POA: Diagnosis not present

## 2022-11-21 DIAGNOSIS — Z0131 Encounter for examination of blood pressure with abnormal findings: Secondary | ICD-10-CM | POA: Diagnosis not present

## 2022-11-21 DIAGNOSIS — R7303 Prediabetes: Secondary | ICD-10-CM | POA: Diagnosis not present

## 2022-12-05 ENCOUNTER — Other Ambulatory Visit: Payer: Self-pay

## 2022-12-05 DIAGNOSIS — I5020 Unspecified systolic (congestive) heart failure: Secondary | ICD-10-CM | POA: Diagnosis not present

## 2022-12-05 DIAGNOSIS — I2510 Atherosclerotic heart disease of native coronary artery without angina pectoris: Secondary | ICD-10-CM | POA: Diagnosis not present

## 2022-12-05 DIAGNOSIS — E782 Mixed hyperlipidemia: Secondary | ICD-10-CM | POA: Diagnosis not present

## 2022-12-05 DIAGNOSIS — I10 Essential (primary) hypertension: Secondary | ICD-10-CM | POA: Diagnosis not present

## 2022-12-05 MED ORDER — ATORVASTATIN CALCIUM 40 MG PO TABS
40.0000 mg | ORAL_TABLET | Freq: Every day | ORAL | 3 refills | Status: DC
  Filled 2022-12-05 – 2022-12-18 (×2): qty 30, 30d supply, fill #0
  Filled 2023-05-06: qty 30, 30d supply, fill #1
  Filled 2023-10-13: qty 30, 30d supply, fill #2

## 2022-12-05 MED ORDER — SPIRONOLACTONE 25 MG PO TABS
25.0000 mg | ORAL_TABLET | Freq: Every day | ORAL | 1 refills | Status: DC
  Filled 2022-12-05 – 2022-12-18 (×2): qty 30, 30d supply, fill #0

## 2022-12-05 MED ORDER — CLOPIDOGREL BISULFATE 75 MG PO TABS
75.0000 mg | ORAL_TABLET | Freq: Every day | ORAL | 1 refills | Status: DC
  Filled 2022-12-05 – 2022-12-18 (×2): qty 30, 30d supply, fill #0
  Filled 2023-07-14: qty 30, 30d supply, fill #1

## 2022-12-05 MED ORDER — ENTRESTO 24-26 MG PO TABS
1.0000 | ORAL_TABLET | Freq: Two times a day (BID) | ORAL | 3 refills | Status: DC
  Filled 2022-12-05 – 2022-12-18 (×2): qty 60, 30d supply, fill #0
  Filled 2023-03-05: qty 60, 30d supply, fill #1
  Filled 2023-05-06: qty 60, 30d supply, fill #2
  Filled 2023-07-14: qty 60, 30d supply, fill #3
  Filled 2023-09-14: qty 60, 30d supply, fill #4

## 2022-12-05 NOTE — Progress Notes (Signed)
 New Patient Visit   Chief Complaint:No chief complaint on file.  Date of Service: 12/05/2022 Date of Birth: 1955-12-08 PCP: Provider No address on file  History of Present Illness:   [name] is a 67 y.o.male patient with HFrEF, CAD s/p DES to LAD, HTN, and HLD who is here to establish care with cardiology. His anginal equivalent is centralized chest pain that radiates to his back. He has not had this feeling since he had his stent placed. He does admit to some shortness of breath with heavy exertion but he just started taking his medications again regularly so he thinks this will improve. He did not have any shortness of breath prior to his stent placement. Patient denies chest pain, palpitations, diaphoresis, syncope, edema, PND, orthopnea.   Past Medical and Surgical History  Past Medical History No past medical history on file.  Past Surgical History He has a past surgical history that includes Colonoscopy (12/18/2020) and egd (12/18/2020).   Medications and Allergies  Current Medications  Current Outpatient Medications on File Prior to Visit  Medication Sig Dispense Refill  . aspirin  81 MG EC tablet Take 81 mg by mouth once daily    . carvediloL  (COREG ) 25 MG tablet Take 25 mg by mouth 2 (two) times daily with meals    . metFORMIN  (GLUCOPHAGE ) 500 MG tablet Take 500 mg by mouth daily with breakfast     No current facility-administered medications on file prior to visit.    Allergies: Patient has no allergy information on record.  Social and Family History  Social History  reports that he has never smoked. He has never used smokeless tobacco. He reports that he does not drink alcohol and does not use drugs.  Family History Family History  Problem Relation Name Age of Onset  . Heart failure Father      Review of Systems   Review of Systems  Respiratory:  Positive for shortness of breath.   Cardiovascular:  Negative for chest pain, palpitations, orthopnea,  claudication, leg swelling and PND.      Physical Examination   Vitals:BP 110/78   Pulse 75   Ht 152.4 cm (5')   Wt 68.2 kg (150 lb 6.4 oz)   SpO2 95%   BMI 29.37 kg/m  Ht:152.4 cm (5') Wt:68.2 kg (150 lb 6.4 oz) ADJ:Anib surface area is 1.7 meters squared. Body mass index is 29.37 kg/m.  Physical Exam Vitals reviewed.  HENT:     Head: Normocephalic and atraumatic.  Cardiovascular:     Rate and Rhythm: Normal rate and regular rhythm.     Pulses: Normal pulses.     Heart sounds: Normal heart sounds. No murmur heard. Pulmonary:     Effort: Pulmonary effort is normal.     Breath sounds: Normal breath sounds.  Abdominal:     General: Bowel sounds are normal.  Musculoskeletal:     Right lower leg: No edema.     Left lower leg: No edema.  Skin:    General: Skin is warm and dry.  Neurological:     Mental Status: He is alert.       Data & Results   No results for input(s): CHOLTOTAL, HDL, LDLCALC, LDLDIRECT, LDL, VLDL, TRIG in the last 73719 hours.  No results for input(s): NA, K, BUN, CREATININE, CO2, GLUCOSE, GLUF, ALT, AST, TBILI, ALB in the last 73719 hours.  No results for input(s): WBC, HGB, HCT, MCV, PLT in the last 73719 hours.  No results for  input(s): INR, TSH, HGBA1C, HBA1C, PROBNP, BNP, HSTNI, DELTAHSTNI, HSTNIINTERP, TROPONINT, CK, CKMB, DDIMER in the last 73719 hours.      Assessment   67 y.o. male with  Encounter Diagnoses  Name Primary?  . HTN, goal below 130/80 Yes  . Mixed hyperlipidemia   . HFrEF (heart failure with reduced ejection fraction) (CMS/HHS-HCC)   . Coronary artery disease involving native coronary artery of native heart without angina pectoris     12/17/2020 ECHO: 1. Left ventricular ejection fraction, by estimation, is 35 to 40%. The left ventricle has moderately decreased function. The left ventricle demonstrates regional wall motion abnormalities (see  scoring diagram/findings for description). Left ventricular   diastolic parameters are consistent with Grade II diastolic dysfunction (pseudonormalization). There is severe hypokinesis of the left ventricular, mid-apical anterior wall, anteroseptal wall and apical segment.   2. Right ventricular systolic function is normal. The right ventricular size is normal.   3. Left atrial size was mildly dilated.   4. The mitral valve is grossly normal. Trivial mitral valve regurgitation. No evidence of mitral stenosis.   5. The aortic valve was not well visualized. Aortic valve regurgitation is not visualized. No aortic stenosis is present.    11/20/2017 LHC:  Previously placed Prox LAD drug eluting stent is widely patent.   Balloon angioplasty was performed.   Ost 1st Diag lesion is 20% stenosed.   Prox RCA lesion is 30% stenosed.   There is mild left ventricular systolic dysfunction.   LV end diastolic pressure is mildly elevated.   The left ventricular ejection fraction is 45-50% by visual estimate.   1.  Widely patent LAD stent with no significant restenosis.  Mild proximal  RCA disease not different from cardiac catheterization last year.  2.  Mildly reduced LV systolic function with an EF of 45% with mid to  distal anterior wall hypokinesis.  Mildly elevated left ventricular  end-diastolic pressure.     Plan   Orders Placed This Encounter  Procedures  . ECG 12-lead  . Echo complete   Continue current cardiac medications. Encourage low-sodium diet, less than 2000 mg daily. Schedule imaging tests in office. Echocardiogram has been ordered. Medications refills sent to pharmacy. EKG today is negative for ischemia. Prior records reviewed by me.  Return in about 6 weeks (around 01/16/2023).   Attestation Statement:   I personally performed the service, non-incident to. (WP)   SABINA CUSTOVIC, DO    Sabina Custovic, DO

## 2022-12-18 ENCOUNTER — Other Ambulatory Visit: Payer: Self-pay

## 2022-12-18 DIAGNOSIS — I5020 Unspecified systolic (congestive) heart failure: Secondary | ICD-10-CM | POA: Diagnosis not present

## 2022-12-19 ENCOUNTER — Other Ambulatory Visit: Payer: Self-pay

## 2022-12-22 DIAGNOSIS — I509 Heart failure, unspecified: Secondary | ICD-10-CM | POA: Diagnosis not present

## 2022-12-22 DIAGNOSIS — Z1389 Encounter for screening for other disorder: Secondary | ICD-10-CM | POA: Diagnosis not present

## 2022-12-22 DIAGNOSIS — Z1331 Encounter for screening for depression: Secondary | ICD-10-CM | POA: Diagnosis not present

## 2022-12-22 DIAGNOSIS — Z0000 Encounter for general adult medical examination without abnormal findings: Secondary | ICD-10-CM | POA: Diagnosis not present

## 2022-12-22 DIAGNOSIS — Z0130 Encounter for examination of blood pressure without abnormal findings: Secondary | ICD-10-CM | POA: Diagnosis not present

## 2022-12-22 DIAGNOSIS — E119 Type 2 diabetes mellitus without complications: Secondary | ICD-10-CM | POA: Diagnosis not present

## 2022-12-27 ENCOUNTER — Emergency Department
Admission: EM | Admit: 2022-12-27 | Discharge: 2022-12-27 | Disposition: A | Discharge status: Home / Self Care | Payer: 59 | Attending: Student in an Organized Health Care Education/Training Program | Admitting: Student in an Organized Health Care Education/Training Program

## 2022-12-27 ENCOUNTER — Emergency Department: Payer: 59

## 2022-12-27 ENCOUNTER — Encounter: Payer: Self-pay | Admitting: Emergency Medicine

## 2022-12-27 ENCOUNTER — Other Ambulatory Visit: Payer: Self-pay

## 2022-12-27 DIAGNOSIS — R0789 Other chest pain: Secondary | ICD-10-CM | POA: Diagnosis not present

## 2022-12-27 DIAGNOSIS — M79603 Pain in arm, unspecified: Secondary | ICD-10-CM | POA: Diagnosis not present

## 2022-12-27 DIAGNOSIS — R079 Chest pain, unspecified: Secondary | ICD-10-CM | POA: Diagnosis not present

## 2022-12-27 LAB — BASIC METABOLIC PANEL
Anion gap: 5 (ref 5–15)
BUN: 15 mg/dL (ref 8–23)
CO2: 27 mmol/L (ref 22–32)
Calcium: 9 mg/dL (ref 8.9–10.3)
Chloride: 104 mmol/L (ref 98–111)
Creatinine, Ser: 0.76 mg/dL (ref 0.61–1.24)
GFR, Estimated: >60 mL/min (ref >60)
Glucose, Bld: 140 mg/dL — ABNORMAL HIGH (ref 70–99)
Potassium: 3.7 mmol/L (ref 3.5–5.1)
Sodium: 136 mmol/L (ref 135–145)

## 2022-12-27 LAB — CBC
HCT: 45 % (ref 39.0–52.0)
Hemoglobin: 15.6 g/dL (ref 13.0–17.0)
MCH: 31.2 pg (ref 26.0–34.0)
MCHC: 34.7 g/dL (ref 30.0–36.0)
MCV: 90 fL (ref 80.0–100.0)
Platelets: 221 10*3/uL (ref 150–400)
RBC: 5 MIL/uL (ref 4.22–5.81)
RDW: 12.3 % (ref 11.5–15.5)
WBC: 6.7 10*3/uL (ref 4.0–10.5)
nRBC: 0 % (ref 0.0–0.2)

## 2022-12-27 LAB — TROPONIN I (HIGH SENSITIVITY)
Troponin I (High Sensitivity): 4 ng/L (ref <18)
Troponin I (High Sensitivity): 4 ng/L (ref <18)

## 2022-12-27 LAB — BRAIN NATRIURETIC PEPTIDE: B Natriuretic Peptide: 28.5 pg/mL (ref 0.0–100.0)

## 2022-12-27 LAB — D-DIMER, QUANTITATIVE: D-Dimer, Quant: <0.27 ug{FEU}/mL (ref 0.00–0.50)

## 2022-12-27 MED ORDER — MORPHINE SULFATE (PF) 4 MG/ML IV SOLN
4.0000 mg | INTRAVENOUS | Status: DC | PRN
  Administered 2022-12-27: 4 mg via INTRAVENOUS
  Filled 2022-12-27: qty 1

## 2022-12-27 MED ORDER — CYCLOBENZAPRINE HCL 10 MG PO TABS: 10.0000 mg | ORAL_TABLET | Freq: Three times a day (TID) | ORAL | 0 refills | Status: DC | PRN

## 2022-12-27 MED ORDER — ONDANSETRON HCL 4 MG/2ML IJ SOLN
4.0000 mg | Freq: Once | INTRAMUSCULAR | Status: AC
  Administered 2022-12-27: 4 mg via INTRAVENOUS
  Filled 2022-12-27: qty 2

## 2022-12-27 NOTE — ED Provider Notes (Signed)
[facility] Provider Note    Event Date/Time   First MD Initiated Contact with Patient 12/27/22 1703     (approximate)   History   Chest Pain and Arm Pain   HPI  Lucas [name] is a 67 y.o. male with a history of of ACS status post stent compliant with his medications presents to the ER for evaluation of 2 to 3 days of left anterior chest pain going into his shoulder.  No diaphoresis.  No shortness of breath.  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  Denies any pain radiating ripping or tearing through to his back.  No nausea or vomiting.     Physical Exam   Triage Vital Signs: ED Triage Vitals  Encounter Vitals Group     BP 12/27/22 1645 127/84     Systolic BP Percentile --      Diastolic BP Percentile --      Pulse Rate 12/27/22 1645 67     Resp 12/27/22 1645 14     Temp 12/27/22 1646 98.3 F (36.8 C)     Temp Source 12/27/22 1646 Oral     SpO2 12/27/22 1645 100 %     Weight 12/27/22 1646 145 lb (65.8 kg)     Height 12/27/22 1646 5' (1.524 m)     Head Circumference --      Peak Flow --      Pain Score 12/27/22 1646 8     Pain Loc --      Pain Education --      Exclude from Growth Chart --     Most recent vital signs: Vitals:   12/27/22 1730 12/27/22 1800  BP: 123/71 112/69  Pulse: 63 61  Resp: 11 19  Temp:    SpO2: 98% 98%     Constitutional: Alert  Eyes: Conjunctivae are normal.  Head: Atraumatic. Nose: No congestion/rhinnorhea. Mouth/Throat: Mucous membranes are moist.   Neck: Painless ROM.  Cardiovascular:   Good peripheral circulation. Respiratory: Normal respiratory effort.  No retractions.  Gastrointestinal: Soft and nontender.  Musculoskeletal:  no deformity Neurologic:  MAE spontaneously. No gross focal neurologic deficits are appreciated.  Skin:  Skin is warm, dry and intact. No rash noted. Psychiatric: Mood and affect are normal. Speech and behavior are  normal.    ED Results / Procedures / Treatments   Labs (all labs ordered are listed, but only abnormal results are displayed) Labs Reviewed  BASIC METABOLIC PANEL - Abnormal; Notable for the following components:      Result Value   Glucose, Bld 140 (*)    All other components within normal limits  CBC  BRAIN NATRIURETIC PEPTIDE  D-DIMER, QUANTITATIVE  TROPONIN I (HIGH SENSITIVITY)  TROPONIN I (HIGH SENSITIVITY)     EKG  ED ECG REPORT I, Willy Eddy, the attending physician, personally viewed and interpreted this ECG.   Date: 12/27/2022  EKG Time: 16:44  Rate: 65  Rhythm: sinus  Axis: left  Intervals: normal  ST&T Change: no stemi, no depressions    RADIOLOGY Please see ED Course for my review and interpretation.  I personally reviewed all radiographic images ordered to evaluate for the above acute complaints and reviewed radiology reports and findings.  These findings were personally discussed with the patient.  Please see medical record for radiology report.    PROCEDURES:  Critical Care performed: No  Procedures   MEDICATIONS ORDERED IN ED: Medications  morphine (PF)  4 MG/ML injection 4 mg (4 mg Intravenous Given 12/27/22 1820)  ondansetron (ZOFRAN) injection 4 mg (4 mg Intravenous Given 12/27/22 1820)     IMPRESSION / MDM / ASSESSMENT AND PLAN / ED COURSE  I reviewed the triage vital signs and the nursing notes.                              Differential diagnosis includes, but is not limited to, ACS, pericarditis, esophagitis, boerhaaves, pe, dissection, pna, bronchitis, costochondritis  Patient presenting to the ER for evaluation of symptoms as described above.  Based on symptoms, risk factors and considered above differential, this presenting complaint could reflect a potentially life-threatening illness therefore the patient will be placed on continuous pulse oximetry and telemetry for monitoring.  Laboratory evaluation will be sent to  evaluate for the above complaints.  Initial troponin negative.  EKG nonischemic.  Suspect musculoskeletal drain.  Patient preserved in the ER with negative troponins.  Negative D-dimer.  Chest x-ray on my review and interpretation without evidence of pneumothorax.  Exam remains reassuring.  Does appear stable and appropriate for outpatient follow-up        FINAL CLINICAL IMPRESSION(S) / ED DIAGNOSES   Final diagnoses:  Chest pain, unspecified type     Rx / DC Orders   ED Discharge Orders          Ordered    cyclobenzaprine (FLEXERIL) 10 MG tablet  3 times daily PRN        12/27/22 1946             Note:  This document was prepared using Dragon voice recognition software and may include unintentional dictation errors.    Willy Eddy, MD 12/27/22 1946

## 2022-12-27 NOTE — ED Triage Notes (Signed)
Pt via POV from home. Pt c/o L arm pain that started 3 days ago. Also reports some SOB. States this feels similar to when he had a STEMI 6 years ago. Denies NV. Pt is A&OX4 and NAD

## 2023-03-05 ENCOUNTER — Other Ambulatory Visit: Payer: Self-pay

## 2023-05-06 ENCOUNTER — Other Ambulatory Visit: Payer: Self-pay

## 2023-06-18 ENCOUNTER — Other Ambulatory Visit: Payer: Self-pay

## 2023-07-14 ENCOUNTER — Other Ambulatory Visit: Payer: Self-pay

## 2023-09-14 ENCOUNTER — Other Ambulatory Visit: Payer: Self-pay

## 2023-10-13 ENCOUNTER — Other Ambulatory Visit: Payer: Self-pay

## 2023-10-22 ENCOUNTER — Other Ambulatory Visit: Payer: Self-pay

## 2023-10-22 MED ORDER — SACUBITRIL-VALSARTAN 24-26 MG PO TABS
1.0000 | ORAL_TABLET | Freq: Two times a day (BID) | ORAL | 3 refills | Status: AC
  Filled 2023-10-22: qty 60, 30d supply, fill #0
  Filled 2023-12-18: qty 60, 30d supply, fill #1
  Filled 2024-02-01: qty 60, 30d supply, fill #2

## 2023-10-22 MED ORDER — TIZANIDINE HCL 2 MG PO TABS
2.0000 mg | ORAL_TABLET | Freq: Three times a day (TID) | ORAL | 1 refills | Status: DC | PRN
  Filled 2023-10-22: qty 60, 10d supply, fill #0

## 2023-10-22 MED ORDER — TIZANIDINE HCL 2 MG PO TABS
2.0000 mg | ORAL_TABLET | Freq: Every day | ORAL | 1 refills | Status: AC | PRN
  Filled 2023-10-22: qty 60, 30d supply, fill #0

## 2023-12-18 ENCOUNTER — Other Ambulatory Visit: Payer: Self-pay

## 2023-12-21 ENCOUNTER — Other Ambulatory Visit: Payer: Self-pay

## 2023-12-21 MED ORDER — METFORMIN HCL 500 MG PO TABS
500.0000 mg | ORAL_TABLET | Freq: Two times a day (BID) | ORAL | 5 refills | Status: AC
  Filled 2023-12-21: qty 60, 30d supply, fill #0

## 2023-12-23 ENCOUNTER — Other Ambulatory Visit: Payer: Self-pay

## 2024-02-01 ENCOUNTER — Other Ambulatory Visit: Payer: Self-pay

## 2024-02-04 ENCOUNTER — Other Ambulatory Visit: Payer: Self-pay

## 2024-02-04 MED ORDER — SACUBITRIL-VALSARTAN 24-26 MG PO TABS: 1.0000 | ORAL_TABLET | Freq: Two times a day (BID) | ORAL | 3 refills | Status: DC

## 2024-02-04 MED ORDER — METFORMIN HCL 500 MG PO TABS
500.0000 mg | ORAL_TABLET | Freq: Two times a day (BID) | ORAL | 5 refills | Status: DC
  Filled 2024-02-04: qty 60, 30d supply, fill #0

## 2024-02-08 ENCOUNTER — Observation Stay
Admission: EM | Admit: 2024-02-08 | Discharge: 2024-02-09 | Disposition: A | Attending: Gastroenterology | Admitting: Gastroenterology

## 2024-02-08 ENCOUNTER — Emergency Department

## 2024-02-08 DIAGNOSIS — I252 Old myocardial infarction: Secondary | ICD-10-CM

## 2024-02-08 DIAGNOSIS — Z8719 Personal history of other diseases of the digestive system: Secondary | ICD-10-CM

## 2024-02-08 DIAGNOSIS — K5731 Diverticulosis of large intestine without perforation or abscess with bleeding: Secondary | ICD-10-CM

## 2024-02-08 DIAGNOSIS — R578 Other shock: Secondary | ICD-10-CM

## 2024-02-08 DIAGNOSIS — K922 Gastrointestinal hemorrhage, unspecified: Principal | ICD-10-CM | POA: Diagnosis present

## 2024-02-08 DIAGNOSIS — K635 Polyp of colon: Secondary | ICD-10-CM

## 2024-02-08 DIAGNOSIS — K5790 Diverticulosis of intestine, part unspecified, without perforation or abscess without bleeding: Secondary | ICD-10-CM

## 2024-02-08 DIAGNOSIS — I25118 Atherosclerotic heart disease of native coronary artery with other forms of angina pectoris: Secondary | ICD-10-CM | POA: Diagnosis present

## 2024-02-08 DIAGNOSIS — E119 Type 2 diabetes mellitus without complications: Secondary | ICD-10-CM

## 2024-02-08 DIAGNOSIS — I2510 Atherosclerotic heart disease of native coronary artery without angina pectoris: Secondary | ICD-10-CM | POA: Diagnosis present

## 2024-02-08 DIAGNOSIS — I2489 Other forms of acute ischemic heart disease: Secondary | ICD-10-CM

## 2024-02-08 DIAGNOSIS — D62 Acute posthemorrhagic anemia: Secondary | ICD-10-CM | POA: Diagnosis present

## 2024-02-08 DIAGNOSIS — I255 Ischemic cardiomyopathy: Secondary | ICD-10-CM | POA: Diagnosis present

## 2024-02-08 DIAGNOSIS — I5022 Chronic systolic (congestive) heart failure: Secondary | ICD-10-CM | POA: Diagnosis present

## 2024-02-08 DIAGNOSIS — I10 Essential (primary) hypertension: Secondary | ICD-10-CM | POA: Diagnosis present

## 2024-02-08 LAB — COMPREHENSIVE METABOLIC PANEL WITH GFR
ALT: 11 U/L (ref 0–44)
AST: 15 U/L (ref 15–41)
Albumin: 3.2 g/dL — ABNORMAL LOW (ref 3.5–5.0)
Alkaline Phosphatase: 39 U/L (ref 38–126)
Anion gap: 7 (ref 5–15)
BUN: 18 mg/dL (ref 8–23)
CO2: 23 mmol/L (ref 22–32)
Calcium: 7.5 mg/dL — ABNORMAL LOW (ref 8.9–10.3)
Chloride: 111 mmol/L (ref 98–111)
Creatinine, Ser: 0.99 mg/dL (ref 0.61–1.24)
GFR, Estimated: >60 mL/min (ref >60)
Glucose, Bld: 185 mg/dL — ABNORMAL HIGH (ref 70–99)
Potassium: 3.9 mmol/L (ref 3.5–5.1)
Sodium: 141 mmol/L (ref 135–145)
Total Bilirubin: 0.2 mg/dL (ref 0.0–1.2)
Total Protein: 5.1 g/dL — ABNORMAL LOW (ref 6.5–8.1)

## 2024-02-08 LAB — CBC WITH DIFFERENTIAL/PLATELET
Abs Immature Granulocytes: 0.04 K/uL (ref 0.00–0.07)
Basophils Absolute: 0 K/uL (ref 0.0–0.1)
Basophils Relative: 0 %
Eosinophils Absolute: 0.2 K/uL (ref 0.0–0.5)
Eosinophils Relative: 4 %
HCT: 33.4 % — ABNORMAL LOW (ref 39.0–52.0)
Hemoglobin: 10.8 g/dL — ABNORMAL LOW (ref 13.0–17.0)
Immature Granulocytes: 1 %
Lymphocytes Relative: 34 %
Lymphs Abs: 1.9 K/uL (ref 0.7–4.0)
MCH: 31.5 pg (ref 26.0–34.0)
MCHC: 32.3 g/dL (ref 30.0–36.0)
MCV: 97.4 fL (ref 80.0–100.0)
Monocytes Absolute: 0.5 K/uL (ref 0.1–1.0)
Monocytes Relative: 10 %
Neutro Abs: 2.9 K/uL (ref 1.7–7.7)
Neutrophils Relative %: 51 %
Platelets: 174 K/uL (ref 150–400)
RBC: 3.43 MIL/uL — ABNORMAL LOW (ref 4.22–5.81)
RDW: 12.1 % (ref 11.5–15.5)
WBC: 5.6 K/uL (ref 4.0–10.5)
nRBC: 0 % (ref 0.0–0.2)

## 2024-02-08 LAB — CBC
HCT: 35 % — ABNORMAL LOW (ref 39.0–52.0)
HCT: 31.7 % — ABNORMAL LOW (ref 39.0–52.0)
Hemoglobin: 12.2 g/dL — ABNORMAL LOW (ref 13.0–17.0)
Hemoglobin: 11.1 g/dL — ABNORMAL LOW (ref 13.0–17.0)
MCH: 31.1 pg (ref 26.0–34.0)
MCH: 31 pg (ref 26.0–34.0)
MCHC: 34.9 g/dL (ref 30.0–36.0)
MCHC: 35 g/dL (ref 30.0–36.0)
MCV: 89.3 fL (ref 80.0–100.0)
MCV: 88.5 fL (ref 80.0–100.0)
Platelets: 174 K/uL (ref 150–400)
Platelets: 166 K/uL (ref 150–400)
RBC: 3.92 MIL/uL — ABNORMAL LOW (ref 4.22–5.81)
RBC: 3.58 MIL/uL — ABNORMAL LOW (ref 4.22–5.81)
RDW: 13 % (ref 11.5–15.5)
RDW: 13.1 % (ref 11.5–15.5)
WBC: 8.7 K/uL (ref 4.0–10.5)
WBC: 8.5 K/uL (ref 4.0–10.5)
nRBC: 0 % (ref 0.0–0.2)
nRBC: 0 % (ref 0.0–0.2)

## 2024-02-08 LAB — HEMOGLOBIN A1C
Hgb A1c MFr Bld: 6 % — ABNORMAL HIGH (ref 4.8–5.6)
Mean Plasma Glucose: 126 mg/dL

## 2024-02-08 LAB — LIPASE, BLOOD: Lipase: 57 U/L — ABNORMAL HIGH (ref 11–51)

## 2024-02-08 LAB — HIV ANTIBODY (ROUTINE TESTING W REFLEX): HIV Screen 4th Generation wRfx: NONREACTIVE

## 2024-02-08 LAB — PROTIME-INR
INR: 1.2 (ref 0.8–1.2)
Prothrombin Time: 16 s — ABNORMAL HIGH (ref 11.4–15.2)

## 2024-02-08 LAB — TROPONIN T, HIGH SENSITIVITY
Troponin T High Sensitivity: 117 ng/L (ref 0–19)
Troponin T High Sensitivity: <15 ng/L (ref 0–19)
Troponin T High Sensitivity: 271 ng/L (ref 0–19)
Troponin T High Sensitivity: 375 ng/L (ref 0–19)

## 2024-02-08 LAB — GLUCOSE, CAPILLARY: Glucose-Capillary: 138 mg/dL — ABNORMAL HIGH (ref 70–99)

## 2024-02-08 LAB — PREPARE RBC (CROSSMATCH)

## 2024-02-08 MED ORDER — ACETAMINOPHEN 650 MG RE SUPP: 650.0000 mg | Freq: Four times a day (QID) | RECTAL | Status: DC | PRN

## 2024-02-08 MED ORDER — PANTOPRAZOLE SODIUM 40 MG IV SOLR
40.0000 mg | Freq: Two times a day (BID) | INTRAVENOUS | Status: DC
  Administered 2024-02-08 – 2024-02-09 (×2): 40 mg via INTRAVENOUS
  Filled 2024-02-08 (×2): qty 10

## 2024-02-08 MED ORDER — MORPHINE SULFATE (PF) 2 MG/ML IV SOLN
2.0000 mg | INTRAVENOUS | Status: DC | PRN
  Administered 2024-02-08: 2 mg via INTRAVENOUS
  Filled 2024-02-08: qty 1

## 2024-02-08 MED ORDER — ONDANSETRON HCL 4 MG PO TABS: 4.0000 mg | ORAL_TABLET | Freq: Four times a day (QID) | ORAL | Status: DC | PRN

## 2024-02-08 MED ORDER — ACETAMINOPHEN 325 MG PO TABS: 650.0000 mg | ORAL_TABLET | Freq: Four times a day (QID) | ORAL | Status: DC | PRN

## 2024-02-08 MED ORDER — CHLORHEXIDINE GLUCONATE CLOTH 2 % EX PADS
6.0000 | MEDICATED_PAD | Freq: Every day | CUTANEOUS | Status: DC
  Administered 2024-02-08: 6 via TOPICAL

## 2024-02-08 MED ORDER — ONDANSETRON HCL 4 MG/2ML IJ SOLN: 4.0000 mg | Freq: Four times a day (QID) | INTRAMUSCULAR | Status: DC | PRN

## 2024-02-08 MED ORDER — SODIUM CHLORIDE 0.9 % IV SOLN: INTRAVENOUS | Status: AC

## 2024-02-08 MED ORDER — IOHEXOL 350 MG/ML SOLN
100.0000 mL | Freq: Once | INTRAVENOUS | Status: AC | PRN
  Administered 2024-02-08: 100 mL via INTRAVENOUS

## 2024-02-08 MED ORDER — PANTOPRAZOLE SODIUM 40 MG IV SOLR
40.0000 mg | Freq: Once | INTRAVENOUS | Status: AC
  Administered 2024-02-08: 40 mg via INTRAVENOUS
  Filled 2024-02-08: qty 10

## 2024-02-08 MED ORDER — SODIUM CHLORIDE 0.9 % IV BOLUS
1000.0000 mL | Freq: Once | INTRAVENOUS | Status: AC
  Administered 2024-02-08: 1000 mL via INTRAVENOUS

## 2024-02-08 MED ORDER — SODIUM CHLORIDE 0.9% IV SOLUTION
Freq: Once | INTRAVENOUS | Status: AC
  Filled 2024-02-08: qty 250

## 2024-02-08 MED ORDER — PEG 3350-KCL-NA BICARB-NACL 420 G PO SOLR
4000.0000 mL | Freq: Once | ORAL | Status: AC
  Administered 2024-02-08: 4000 mL via ORAL
  Filled 2024-02-08: qty 4000

## 2024-02-08 MED ORDER — ORAL CARE MOUTH RINSE: 15.0000 mL | OROMUCOSAL | Status: DC | PRN

## 2024-02-08 MED ORDER — HYDROCODONE-ACETAMINOPHEN 5-325 MG PO TABS: 1.0000 | ORAL_TABLET | ORAL | Status: DC | PRN

## 2024-02-08 NOTE — H&P (Signed)
 History and Physical    Patient: Lucas [name] DOB: Jun 02, 1955 DOA: 02/08/2024 DOS: the patient was seen and examined on 02/08/2024 PCP: Berl Todd BROCKS, MD  Patient coming from: Home  Chief Complaint:  Chief Complaint  Patient presents with   Rectal Bleeding    HPI: Lucas [name] is a 68 y.o. male with medical history significant for CAD, STEMI, HFrEF with grade I diastolic dysfunction, controlled prediabetes, diverticular bleed in 2022, being admitted with a diverticular bleed resulting in acute blood loss and syncopal event.  History provided by wife at bedside who states that patient started bleeding on the night of 12/7 and had multiple visits, over 5 to the bathroom and the commode was filled with blood each time.  He then passed out following one of the bowel movements but she was able to hold him and eased him down to the floor.  She states she was out for about 3 minutes.  At this time they called EMS.  Patient complains of lower abdominal and back pain. On arrival of EMS he was hypotensive in the 70s. By arrival to the ED BP 93/60 and heart rate 59.  BP improved to 117/70 with 2 L NS bolus and initiation of a unit of emergency PRBC Hemoglobin 10 down from 15.6 a year ago.  Other labs unremarkable.   CT angio bleed without active bleeding  Admission requested. At the time of my evaluation, patient awake but looks pale.  Complains of lower abdominal pain and back pain.  States he felt like he was going to die.     Past Medical History:  Diagnosis Date   Acute ST elevation myocardial infarction (STEMI) involving left anterior descending (LAD) coronary artery (HCC)    a. 01/2017 Ant STEMI/PCI: LAD 20ost, 100p (3.0x23 Xience Sierra DES).   CAD (coronary artery disease)    a. 01/2017 Ant STEMI/PCI: LM 20d, LAD 20ost, 100p (3.0x23 Xience Sierra DES), 33m, D1 50ost, LCX nl, RCA 30p; b. 11/2017 MV: Intermediate risk w/ 1mm inf ST dep; c. 11/2017 Cath:  LM nl, LAD patent prox stent, D1 20ost, LCX nl, RCA 30p. EF 45-50%.   Chronic anticoagulation    DAPT (ASA + clopidogrel )   Chronic combined systolic (congestive) and diastolic (congestive) heart failure (HCC)    a. 01/2018 Echo: EF 30-35%, sev antsept, ant, apical HK. Gr1 DD, mild MR.   Essential hypertension    Hyperlipidemia    a. Myalgias with high dose lipitor - tolerating 40mg  daily.   Ischemic cardiomyopathy    a. 01/2018 Echo: EF 30-35%.   Type II diabetes mellitus (HCC)    Past Surgical History:  Procedure Laterality Date   APPENDECTOMY     COLONOSCOPY N/A 12/18/2020   Procedure: COLONOSCOPY;  Surgeon: Toledo, Ladell POUR, MD;  Location: [facility] ENDOSCOPY;  Service: Gastroenterology;  Laterality: N/A;   CORONARY/GRAFT ACUTE MI REVASCULARIZATION N/A 01/25/2017   Procedure: Coronary/Graft Acute MI Revascularization;  Surgeon: Mady Bruckner, MD;  Location: [facility];  Service: Cardiovascular;  Laterality: N/A;   ESOPHAGOGASTRODUODENOSCOPY N/A 12/18/2020   Procedure: ESOPHAGOGASTRODUODENOSCOPY (EGD);  Surgeon: Toledo, Ladell POUR, MD;  Location: [facility] ENDOSCOPY;  Service: Gastroenterology;  Laterality: N/A;   LEFT HEART CATH AND CORONARY ANGIOGRAPHY N/A 01/25/2017   Procedure: LEFT HEART CATH AND CORONARY ANGIOGRAPHY;  Surgeon: Mady Bruckner, MD;  Location: [facility];  Service: Cardiovascular;  Laterality: N/A;   LEFT HEART CATH AND CORONARY ANGIOGRAPHY N/A 11/20/2017   Procedure: LEFT HEART CATH AND  CORONARY ANGIOGRAPHY;  Surgeon: Darron Deatrice LABOR, MD;  Location: [facility];  Service: Cardiovascular;  Laterality: N/A;   XI ROBOTIC ASSISTED INGUINAL HERNIA REPAIR WITH MESH Right 11/06/2020   Procedure: XI ROBOTIC ASSISTED INGUINAL HERNIA REPAIR WITH MESH;  Surgeon: Desiderio Schanz, MD;  Location: [facility] ORS;  Service: General;  Laterality: Right;   Social History:  reports that he has never smoked. He has never used smokeless tobacco. He reports that he does  not drink alcohol and does not use drugs.  No Known Allergies  Family History  Problem Relation Age of Onset   Asthma Mother    Heart disease Father        s/p pacemaker   Arrhythmia Father    Asthma Father    Healthy Sister     Prior to Admission medications   Medication Sig Start Date End Date Taking? Authorizing Provider  aspirin  EC 81 MG tablet Take 81 mg by mouth daily. Swallow whole.    [provider]  atorvastatin  (LIPITOR) 10 MG tablet TOMAR UNA TABLETA POR BOCA DIARIO 08/09/20 08/14/21  Donette [place] A, FNP  atorvastatin  (LIPITOR) 40 MG tablet Take 1 tablet (40 mg total) by mouth daily. 12/05/22     carvedilol  (COREG ) 12.5 MG tablet TOMAR UNA TABLETA POR BOCA DOS VECES AL DIA 08/09/20 08/14/21  Donette City A, FNP  clopidogrel  (PLAVIX ) 75 MG tablet Take 1 tablet (75 mg total) by mouth daily. 08/09/20   Donette City LABOR, FNP  clopidogrel  (PLAVIX ) 75 MG tablet Take 1 tablet (75 mg total) by mouth daily. 12/05/22     cyclobenzaprine  (FLEXERIL ) 10 MG tablet Take 1 tablet (10 mg total) by mouth 3 (three) times daily as needed. 12/27/22   Lang Dover, MD  ENTRESTO  24-26 MG Take 1 tablet by mouth every 12 (twelve) hours. 12/05/22     metFORMIN  (GLUCOPHAGE ) 500 MG tablet Take 1 tablet (500 mg total) by mouth 2 (two) times daily with a meal. 12/21/23     metFORMIN  (GLUCOPHAGE ) 500 MG tablet Take 1 tablet (500 mg total) by mouth 2 (two) times daily with a meal. 02/04/24     pantoprazole  (PROTONIX ) 40 MG tablet Take 1 tablet (40 mg total) by mouth daily. 12/18/20 08/14/21  Awanda City, MD  sacubitril -valsartan  (ENTRESTO ) 24-26 MG MUST MAKE HEART FAILURE CLINIC APPT FOR REFILLS 11/05/21   Donette City LABOR, FNP  sacubitril -valsartan  (ENTRESTO ) 24-26 MG Take 1 tablet by mouth 2 (two) times daily. 10/22/23     sacubitril -valsartan  (ENTRESTO ) 24-26 MG Take 1 tablet by mouth 2 (two) times daily for heart. 02/04/24     spironolactone  (ALDACTONE ) 25 MG tablet Hold until followup with your outpatient  doctor because your blood has been low normal without this. Patient not taking: Reported on 08/14/2021 12/18/20   Awanda City, MD  spironolactone  (ALDACTONE ) 25 MG tablet Take 1 tablet (25 mg total) by mouth daily. 12/05/22     tiZANidine  (ZANAFLEX ) 2 MG tablet Take 1-2 tablets (2-4 mg total) by mouth daily as needed for pain. 10/22/23     tiZANidine  (ZANAFLEX ) 2 MG tablet Take 1-2 tablets (2-4 mg total) by mouth 3 (three) times daily as needed for pain. 10/22/23       Physical Exam: Vitals:   02/08/24 0210 02/08/24 0220 02/08/24 0245 02/08/24 0352  BP:  113/75 104/69 117/70  Pulse:  (!) 58 67 65  Resp:    17  Temp:   98.5 F (36.9 C) 98.6 F (37 C)  TempSrc:  Oral Oral  SpO2:  100% 99% 97%  Weight: 69.1 kg     Height: 5' (1.524 m)      Physical Exam Vitals and nursing note reviewed.  Constitutional:      General: He is not in acute distress. HENT:     Head: Normocephalic and atraumatic.  Cardiovascular:     Rate and Rhythm: Regular rhythm. Bradycardia present.     Heart sounds: Normal heart sounds.  Pulmonary:     Effort: Pulmonary effort is normal.     Breath sounds: Normal breath sounds.  Abdominal:     Palpations: Abdomen is soft.     Tenderness: There is no abdominal tenderness.  Neurological:     Mental Status: Mental status is at baseline.     Labs on Admission: I have personally reviewed following labs and imaging studies  CBC: Recent Labs  Lab 02/08/24 0215  WBC 5.6  NEUTROABS 2.9  HGB 10.8*  HCT 33.4*  MCV 97.4  PLT 174   Basic Metabolic Panel: Recent Labs  Lab 02/08/24 0215  NA 141  K 3.9  CL 111  CO2 23  GLUCOSE 185*  BUN 18  CREATININE 0.99  CALCIUM  7.5*   GFR: Estimated Creatinine Clearance: 59.8 mL/min (by C-G formula based on SCr of 0.99 mg/dL). Liver Function Tests: Recent Labs  Lab 02/08/24 0215  AST 15  ALT 11  ALKPHOS 39  BILITOT 0.2  PROT 5.1*  ALBUMIN 3.2*   Recent Labs  Lab 02/08/24 0215  LIPASE 57*   No results  for input(s): AMMONIA in the last 168 hours. Coagulation Profile: Recent Labs  Lab 02/08/24 0215  INR 1.2   Cardiac Enzymes: No results for input(s): CKTOTAL, CKMB, CKMBINDEX, TROPONINI in the last 168 hours. BNP (last 3 results) No results for input(s): PROBNP in the last 8760 hours. HbA1C: No results for input(s): HGBA1C in the last 72 hours. CBG: No results for input(s): GLUCAP in the last 168 hours. Lipid Profile: No results for input(s): CHOL, HDL, LDLCALC, TRIG, CHOLHDL, LDLDIRECT in the last 72 hours. Thyroid Function Tests: No results for input(s): TSH, T4TOTAL, FREET4, T3FREE, THYROIDAB in the last 72 hours. Anemia Panel: No results for input(s): VITAMINB12, FOLATE, FERRITIN, TIBC, IRON, RETICCTPCT in the last 72 hours. Urine analysis:    Component Value Date/Time   COLORURINE COLORLESS (A) 10/16/2020 1342   APPEARANCEUR CLEAR (A) 10/16/2020 1342   LABSPEC 1.025 10/16/2020 1342   PHURINE 7.0 10/16/2020 1342   GLUCOSEU NEGATIVE 10/16/2020 1342   HGBUR NEGATIVE 10/16/2020 1342   BILIRUBINUR NEGATIVE 10/16/2020 1342   KETONESUR NEGATIVE 10/16/2020 1342   PROTEINUR NEGATIVE 10/16/2020 1342   NITRITE NEGATIVE 10/16/2020 1342   LEUKOCYTESUR NEGATIVE 10/16/2020 1342    Radiological Exams on Admission: CT ANGIO GI BLEED Result Date: 02/08/2024 EXAM: CTA ABDOMEN AND PELVIS WITH CONTRAST 02/08/2024 02:50:29 AM TECHNIQUE: CTA images of the abdomen and pelvis with intravenous contrast. 100 mL (iohexol  (OMNIPAQUE ) 350 MG/ML injection 100 mL IOHEXOL  350 MG/ML SOLN) was administered. Three-dimensional MIP/volume rendered formations were performed. Automated exposure control, iterative reconstruction, and/or weight based adjustment of the mA/kV was utilized to reduce the radiation dose to as low as reasonably achievable. COMPARISON: None available. CLINICAL HISTORY: LGIB pale hypotension FINDINGS: VASCULATURE: GI BLEED: No  extravasation of intravenous contrast within the lumen of the bowel. AORTA: Atherosclerotic plaque. No acute finding. No abdominal aortic aneurysm. No dissection. CELIAC TRUNK: No acute finding. No occlusion or significant stenosis. SUPERIOR MESENTERIC ARTERY: No acute finding. No  occlusion or significant stenosis. INFERIOR MESENTERIC ARTERY: No acute finding. No occlusion or significant stenosis. RENAL ARTERIES: No acute finding. No occlusion or significant stenosis. ILIAC ARTERIES: No acute finding. No occlusion or significant stenosis. ABDOMEN/PELVIS: LOWER CHEST: Visualized portion of the lower chest demonstrates no acute abnormality. LIVER: The liver is unremarkable. GALLBLADDER AND BILE DUCTS: Gallbladder is unremarkable. No biliary ductal dilatation. SPLEEN: The spleen is unremarkable. PANCREAS: The pancreas is unremarkable. ADRENAL GLANDS: Bilateral adrenal glands demonstrate no acute abnormality. KIDNEYS, URETERS AND BLADDER: Enlarged prostate measuring up to 4.9 cm. No stones in the kidneys or ureters. No hydronephrosis. No perinephric or periureteral stranding. Urinary bladder is unremarkable. GI AND BOWEL: Small hiatal hernia. Stomach and duodenal sweep demonstrate no acute abnormality. No small or large bowel thickening or dilatation. The appendix is unremarkable. Colonic diverticulosis. REPRODUCTIVE: Enlarged prostate measuring up to 4.9 cm. PERITONEUM AND RETROPERITONEUM: No ascites or free air. LYMPH NODES: No lymphadenopathy. BONES AND SOFT TISSUES: Intervertebral disc space vacuum phenomenon at the L5-S1 level. Grade 1 anterolisthesis of L5 on S1. No acute soft tissue abnormality. IMPRESSION: 1. No CT evidence of  active GI hemorrhage. 2. Small hiatal hernia. 3. Colonic diverticulosis without evidence of active diverticulitis. 4. Prostatomegaly. Electronically signed by: Morgane Naveau MD 02/08/2024 03:05 AM EST RP Workstation: [id number]   Data Reviewed for HPI: Relevant notes from primary care  and specialist visits, past discharge summaries as available in EHR, including Care Everywhere. Prior diagnostic testing as pertinent to current admission diagnoses Updated medications and problem lists for reconciliation ED course, including vitals, labs, imaging, treatment and response to treatment Triage notes, nursing and pharmacy notes and ED provider's notes Notable results as noted above in HPI      Assessment and Plan: * ABLA ( acute blood loss anemia) Lower GI bleed secondary to diverticulosis with hemorrhage History of diverticular bleed 2022 Continue transfusion of current unit Serial H&H thereafter Keep n.p.o. IV Protonix  GI consult Can consider surgical consult for definitive treatment  History of anterior ST elevation myocardial infarction (STEMI) Patient reported mild chest pain In the setting of acute blood loss will get baseline troponin Hold BP lowering GDMT and antiplatelet  HTN (hypertension) Hold antihypertensives in the setting of acute blood loss anemia  Chronic systolic heart failure (HCC) Ischemic cardiomyopathy Clinically euvolemic Holding all oral meds for now Monitor for fluid overload in view of IV fluid resuscitation and blood transfusion Daily weights     DVT prophylaxis: SCD  Consults: GI  Advance Care Planning:   Code Status: Prior   Family Communication: wife and son  Disposition Plan: Back to previous home environment  Severity of Illness: The appropriate patient status for this patient is OBSERVATION. Observation status is judged to be reasonable and necessary in order to provide the required intensity of service to ensure the patient's safety. The patient's presenting symptoms, physical exam findings, and initial radiographic and laboratory data in the context of their medical condition is felt to place them at decreased risk for further clinical deterioration. Furthermore, it is anticipated that the patient will be medically  stable for discharge from the hospital within 2 midnights of admission.   Author: Delayne LULLA Solian, MD 02/08/2024 4:27 AM  For on call review [email or web address].

## 2024-02-08 NOTE — Hospital Course (Addendum)
 Lucas [name]

## 2024-02-08 NOTE — Assessment & Plan Note (Addendum)
 Lower GI bleed secondary to diverticulosis with hemorrhage History of diverticular bleed 2022 Continue transfusion of current unit Serial H&H thereafter Keep n.p.o. IV Protonix  GI consult Can consider surgical consult for definitive treatment

## 2024-02-08 NOTE — ED Provider Notes (Addendum)
 [facility] Provider Note    None    (approximate)   History   Rectal Bleeding   HPI  Lucas [name] is a 68 y.o. male   Past medical history of CAD, ischemic cardiomyopathy and CHF, hypertension, diabetes type 2, hyperlipidemia, dual antiplatelet therapy aspirin  and clopidogrel , status post remote appendectomy here with lower GI bleeding for the last 2 to 3 days.  Diarrheal illness at first but then mixed in with maroon blood, profuse bleeding the last couple of days.  Left lower quadrant pain.  Has been feeling increasingly weak, feels presyncopal lightheaded when standing.  Denies alcohol use.  Self discontinued his dual antiplatelet at the onset of bleeding about a day ago.  Information obtained via Spanish interpreter.   Independent Historian contributed to assessment above: EMS reports hypotension en route.  External Medical Documents Reviewed: Previous hospital notes from 2022 admitted for gastrointestinal bleed thought to be diverticular bleed       Physical Exam   Triage Vital Signs: ED Triage Vitals  Encounter Vitals Group     BP      Girls Systolic BP Percentile      Girls Diastolic BP Percentile      Boys Systolic BP Percentile      Boys Diastolic BP Percentile      Pulse      Resp      Temp      Temp src      SpO2      Weight      Height      Head Circumference      Peak Flow      Pain Score      Pain Loc      Pain Education      Exclude from Growth Chart     Most recent vital signs: Vitals:   02/08/24 0220 02/08/24 0245  BP: 113/75   Pulse: (!) 58 (!) 133  Resp:    Temp:    SpO2: 100% (!) 70%    General: Awake, answering questions appropriately, pale appearing. CV:  Skin is pale. Resp:  Normal effort.  Abd:  No distention.  Left lower quadrant tenderness without rigidity or guarding Other:  Rectal exam shows maroon-colored blood.  Hypotension 80s over 50s.   ED Results / Procedures /  Treatments   Labs (all labs ordered are listed, but only abnormal results are displayed) Labs Reviewed  COMPREHENSIVE METABOLIC PANEL WITH GFR - Abnormal; Notable for the following components:      Result Value   Glucose, Bld 185 (*)    Calcium  7.5 (*)    Total Protein 5.1 (*)    Albumin 3.2 (*)    All other components within normal limits  CBC WITH DIFFERENTIAL/PLATELET - Abnormal; Notable for the following components:   RBC 3.43 (*)    Hemoglobin 10.8 (*)    HCT 33.4 (*)    All other components within normal limits  PROTIME-INR - Abnormal; Notable for the following components:   Prothrombin Time 16.0 (*)    All other components within normal limits  LIPASE, BLOOD - Abnormal; Notable for the following components:   Lipase 57 (*)    All other components within normal limits  URINALYSIS, W/ REFLEX TO CULTURE (INFECTION SUSPECTED)  TYPE AND SCREEN  PREPARE RBC (CROSSMATCH)    RADIOLOGY I independently reviewed and interpreted hemoglobin 10.8 last checked around 15 last year I also reviewed radiologist's formal read.  PROCEDURES:  Critical Care performed: Yes, see critical care procedure note(s)  .Critical Care  Performed by: Cyrena Mylar, MD Authorized by: Cyrena Mylar, MD   Critical care provider statement:    Critical care time (minutes):  30   Critical care was time spent personally by me on the following activities:  Development of treatment plan with patient or surrogate, discussions with consultants, evaluation of patient's response to treatment, examination of patient, ordering and review of laboratory studies, ordering and review of radiographic studies, ordering and performing treatments and interventions, pulse oximetry, re-evaluation of patient's condition and review of old charts    MEDICATIONS ORDERED IN ED: Medications  0.9 %  sodium chloride  infusion (Manually program via Guardrails IV Fluids) (has no administration in time range)  pantoprazole  (PROTONIX )  injection 40 mg (has no administration in time range)  sodium chloride  0.9 % bolus 1,000 mL (1,000 mLs Intravenous New Bag/Given 02/08/24 0254)  iohexol  (OMNIPAQUE ) 350 MG/ML injection 100 mL (100 mLs Intravenous Contrast Given 02/08/24 0249)    External physician / consultants:  I spoke with hospital medicine for admission and regarding care plan for this patient.   IMPRESSION / MDM / ASSESSMENT AND PLAN / ED COURSE  I reviewed the triage vital signs and the nursing notes.                                Patient's presentation is most consistent with acute presentation with potential threat to life or bodily function.  Differential diagnosis includes, but is not limited to, lower GI bleeding leading to hemorrhagic shock, intra-abdominal infection   The patient is on the cardiac monitor to evaluate for evidence of arrhythmia and/or significant heart rate changes.  MDM:    Lower GI bleeding leading to hemorrhagic shock and that he appears pale, hypotensive, lightheaded upon standing, profuse bleeding over the last 2 days reported by patient.  Given the vital sign abnormalities ordered for an emergent release 1 unit of blood and 1 L of normal saline with 2 units ready to release ahead.  GI bleed protocol CT angiogram of the abdomen ordered as well.  Will need admission after initial stabilization.   No active bleeding on CT angiogram.  Diverticulosis.  I think diverticular bleed as he has had a history of the same in 2022.  Much more stable on reevaluation after the unit of blood and fluids, blood pressure normalized.  Admit to hospital service.       FINAL CLINICAL IMPRESSION(S) / ED DIAGNOSES   Final diagnoses:  Lower GI bleed  Hemorrhagic shock (HCC)  Diverticulosis     Rx / DC Orders   ED Discharge Orders     None        Note:  This document was prepared using Dragon voice recognition software and may include unintentional dictation errors.    Cyrena Mylar,  MD 02/08/24 [id number]    Cyrena Mylar, MD 02/08/24 LUCIENNE    Cyrena Mylar, MD 02/08/24 [phone]

## 2024-02-08 NOTE — Consult Note (Addendum)
 Lucas Copping, MD [facility]  [address], [address] [place], [place] [address] Phone: [phone] Fax : [phone]  Consultation  Referring Provider:     Dr. Cleatus Primary Care Physician:  Berl Todd BROCKS, MD Primary Gastroenterologist:  Dr. Renny         Reason for Consultation:     Lower GI bleed  Date of Admission:  02/08/2024 Date of Consultation:  02/08/2024         HPI:   Lucas [name] is a 68 y.o. male who has a history of having a GI bleed back in 2022.  The patient has a history of coronary artery disease status post STEMI with grade 1 diastolic dysfunction and history of heart failure with preserved ejection fraction.  The patient had been in the emergency department back in 2022 for dark red bowel movements.  At that time the patient was also reporting some right flank pain and right inguinal pain.  The patient underwent an EGD and colonoscopy that showed diverticulosis and gastritis without any active bleeding.  The patient was suggested to have a serology test for H. pylori but it does not appear that it had been done. The patient now comes in with rectal bleeding that started last night.  He also reports that he has had some abdominal pain across his mid to left side of his abdomen.  He states that that pain started a few hours ago and usually happens when he is constipated.  Patient has not had any further GI bleeding since he was then.  The patient's hemoglobin trends have shown:  Component     Latest Ref Rng 12/17/2020 12/18/2020 12/27/2022 02/08/2024  Hemoglobin     13.0 - 17.0 g/dL 89.4 (L)  88.6 (L)  84.3  10.8 (L)   HCT     39.0 - 52.0 % 29.2 (L)  31.6 (L)  45.0  33.4 (L)    The patient's troponin was 117.  Due to the rectal bleeding and low hemoglobin the patient was admitted to the ICU due to being admitted with hypotension on presentation to the ER.  The patient had a negative CT angiography.  Past Medical History:  Diagnosis Date   Acute ST  elevation myocardial infarction (STEMI) involving left anterior descending (LAD) coronary artery (HCC)    a. 01/2017 Ant STEMI/PCI: LAD 20ost, 100p (3.0x23 Xience Sierra DES).   CAD (coronary artery disease)    a. 01/2017 Ant STEMI/PCI: LM 20d, LAD 20ost, 100p (3.0x23 Xience Sierra DES), 40m, D1 50ost, LCX nl, RCA 30p; b. 11/2017 MV: Intermediate risk w/ 1mm inf ST dep; c. 11/2017 Cath: LM nl, LAD patent prox stent, D1 20ost, LCX nl, RCA 30p. EF 45-50%.   Chronic anticoagulation    DAPT (ASA + clopidogrel )   Chronic combined systolic (congestive) and diastolic (congestive) heart failure (HCC)    a. 01/2018 Echo: EF 30-35%, sev antsept, ant, apical HK. Gr1 DD, mild MR.   Essential hypertension    Hyperlipidemia    a. Myalgias with high dose lipitor - tolerating 40mg  daily.   Ischemic cardiomyopathy    a. 01/2018 Echo: EF 30-35%.   Type II diabetes mellitus (HCC)     Past Surgical History:  Procedure Laterality Date   APPENDECTOMY     COLONOSCOPY N/A 12/18/2020   Procedure: COLONOSCOPY;  Surgeon: Toledo, Ladell POUR, MD;  Location: [facility] ENDOSCOPY;  Service: Gastroenterology;  Laterality: N/A;   CORONARY/GRAFT ACUTE MI REVASCULARIZATION N/A 01/25/2017  Procedure: Coronary/Graft Acute MI Revascularization;  Surgeon: Mady Bruckner, MD;  Location: [facility] INVASIVE CV LAB;  Service: Cardiovascular;  Laterality: N/A;   ESOPHAGOGASTRODUODENOSCOPY N/A 12/18/2020   Procedure: ESOPHAGOGASTRODUODENOSCOPY (EGD);  Surgeon: Toledo, Ladell POUR, MD;  Location: [facility] ENDOSCOPY;  Service: Gastroenterology;  Laterality: N/A;   LEFT HEART CATH AND CORONARY ANGIOGRAPHY N/A 01/25/2017   Procedure: LEFT HEART CATH AND CORONARY ANGIOGRAPHY;  Surgeon: Mady Bruckner, MD;  Location: [facility] INVASIVE CV LAB;  Service: Cardiovascular;  Laterality: N/A;   LEFT HEART CATH AND CORONARY ANGIOGRAPHY N/A 11/20/2017   Procedure: LEFT HEART CATH AND CORONARY ANGIOGRAPHY;  Surgeon: Darron Deatrice LABOR, MD;  Location: [facility] INVASIVE CV  LAB;  Service: Cardiovascular;  Laterality: N/A;   XI ROBOTIC ASSISTED INGUINAL HERNIA REPAIR WITH MESH Right 11/06/2020   Procedure: XI ROBOTIC ASSISTED INGUINAL HERNIA REPAIR WITH MESH;  Surgeon: Desiderio Schanz, MD;  Location: [facility] ORS;  Service: General;  Laterality: Right;    Prior to Admission medications   Medication Sig Start Date End Date Taking? Authorizing Provider  aspirin  EC 81 MG tablet Take 81 mg by mouth daily. Swallow whole.    [provider]  atorvastatin  (LIPITOR) 10 MG tablet TOMAR UNA TABLETA POR BOCA DIARIO 08/09/20 08/14/21  Donette City A, FNP  atorvastatin  (LIPITOR) 40 MG tablet Take 1 tablet (40 mg total) by mouth daily. 12/05/22     carvedilol  (COREG ) 12.5 MG tablet TOMAR UNA TABLETA POR BOCA DOS VECES AL DIA 08/09/20 08/14/21  Donette City A, FNP  clopidogrel  (PLAVIX ) 75 MG tablet Take 1 tablet (75 mg total) by mouth daily. 08/09/20   Donette City LABOR, FNP  clopidogrel  (PLAVIX ) 75 MG tablet Take 1 tablet (75 mg total) by mouth daily. 12/05/22     cyclobenzaprine  (FLEXERIL ) 10 MG tablet Take 1 tablet (10 mg total) by mouth 3 (three) times daily as needed. 12/27/22   Lang Dover, MD  ENTRESTO  24-26 MG Take 1 tablet by mouth every 12 (twelve) hours. 12/05/22     metFORMIN  (GLUCOPHAGE ) 500 MG tablet Take 1 tablet (500 mg total) by mouth 2 (two) times daily with a meal. 12/21/23     metFORMIN  (GLUCOPHAGE ) 500 MG tablet Take 1 tablet (500 mg total) by mouth 2 (two) times daily with a meal. 02/04/24     pantoprazole  (PROTONIX ) 40 MG tablet Take 1 tablet (40 mg total) by mouth daily. 12/18/20 08/14/21  Awanda City, MD  sacubitril -valsartan  (ENTRESTO ) 24-26 MG MUST MAKE HEART FAILURE CLINIC APPT FOR REFILLS 11/05/21   Donette City LABOR, FNP  sacubitril -valsartan  (ENTRESTO ) 24-26 MG Take 1 tablet by mouth 2 (two) times daily. 10/22/23     sacubitril -valsartan  (ENTRESTO ) 24-26 MG Take 1 tablet by mouth 2 (two) times daily for heart. 02/04/24     spironolactone  (ALDACTONE ) 25 MG tablet  Hold until followup with your outpatient doctor because your blood has been low normal without this. Patient not taking: Reported on 08/14/2021 12/18/20   Awanda City, MD  spironolactone  (ALDACTONE ) 25 MG tablet Take 1 tablet (25 mg total) by mouth daily. 12/05/22     tiZANidine  (ZANAFLEX ) 2 MG tablet Take 1-2 tablets (2-4 mg total) by mouth daily as needed for pain. 10/22/23     tiZANidine  (ZANAFLEX ) 2 MG tablet Take 1-2 tablets (2-4 mg total) by mouth 3 (three) times daily as needed for pain. 10/22/23       Family History  Problem Relation Age of Onset   Asthma Mother    Heart disease Father  s/p pacemaker   Arrhythmia Father    Asthma Father    Healthy Sister      Social History   Tobacco Use   Smoking status: Never   Smokeless tobacco: Never  Vaping Use   Vaping status: Never Used  Substance Use Topics   Alcohol use: No   Drug use: No    Allergies as of 02/08/2024   (No Known Allergies)    Review of Systems:    All systems reviewed and negative except where noted in HPI.   Physical Exam:  Vital signs in last 24 hours: Temp:  [98.4 F (36.9 C)-99.4 F (37.4 C)] 99.4 F (37.4 C) (12/08 0745) Pulse Rate:  [57-114] 89 (12/08 0745) Resp:  [11-17] 17 (12/08 0745) BP: (93-118)/(60-77) 99/63 (12/08 0745) SpO2:  [93 %-100 %] 100 % (12/08 0745) Weight:  [69.1 kg] 69.1 kg (12/08 0210)   General:   Pleasant, cooperative in NAD Head:  Normocephalic and atraumatic. Eyes:   No icterus.   Conjunctiva pink. PERRLA. Ears:  Normal auditory acuity. Neck:  Supple; no masses or thyroidomegaly Lungs: Respirations even and unlabored. Lungs clear to auscultation bilaterally.   No wheezes, crackles, or rhonchi.  Heart:  Regular rate and rhythm;  Without murmur, clicks, rubs or gallops Abdomen:  Soft, nondistended, nontender. Normal bowel sounds. No appreciable masses or hepatomegaly.  No rebound or guarding.  Rectal:  Not performed. Msk:  Symmetrical without gross deformities.     Extremities:  Without edema, cyanosis or clubbing. Neurologic:  Alert and oriented x3;  grossly normal neurologically. Skin:  Intact without significant lesions or rashes. Cervical Nodes:  No significant cervical adenopathy. Psych:  Alert and cooperative. Normal affect.  LAB RESULTS: Recent Labs    02/08/24 0215  WBC 5.6  HGB 10.8*  HCT 33.4*  PLT 174   BMET Recent Labs    02/08/24 0215  NA 141  K 3.9  CL 111  CO2 23  GLUCOSE 185*  BUN 18  CREATININE 0.99  CALCIUM  7.5*   LFT Recent Labs    02/08/24 0215  PROT 5.1*  ALBUMIN 3.2*  AST 15  ALT 11  ALKPHOS 39  BILITOT 0.2   PT/INR Recent Labs    02/08/24 0215  LABPROT 16.0*  INR 1.2    STUDIES: CT ANGIO GI BLEED Result Date: 02/08/2024 EXAM: CTA ABDOMEN AND PELVIS WITH CONTRAST 02/08/2024 02:50:29 AM TECHNIQUE: CTA images of the abdomen and pelvis with intravenous contrast. 100 mL (iohexol  (OMNIPAQUE ) 350 MG/ML injection 100 mL IOHEXOL  350 MG/ML SOLN) was administered. Three-dimensional MIP/volume rendered formations were performed. Automated exposure control, iterative reconstruction, and/or weight based adjustment of the mA/kV was utilized to reduce the radiation dose to as low as reasonably achievable. COMPARISON: None available. CLINICAL HISTORY: LGIB pale hypotension FINDINGS: VASCULATURE: GI BLEED: No extravasation of intravenous contrast within the lumen of the bowel. AORTA: Atherosclerotic plaque. No acute finding. No abdominal aortic aneurysm. No dissection. CELIAC TRUNK: No acute finding. No occlusion or significant stenosis. SUPERIOR MESENTERIC ARTERY: No acute finding. No occlusion or significant stenosis. INFERIOR MESENTERIC ARTERY: No acute finding. No occlusion or significant stenosis. RENAL ARTERIES: No acute finding. No occlusion or significant stenosis. ILIAC ARTERIES: No acute finding. No occlusion or significant stenosis. ABDOMEN/PELVIS: LOWER CHEST: Visualized portion of the lower chest demonstrates  no acute abnormality. LIVER: The liver is unremarkable. GALLBLADDER AND BILE DUCTS: Gallbladder is unremarkable. No biliary ductal dilatation. SPLEEN: The spleen is unremarkable. PANCREAS: The pancreas is unremarkable. ADRENAL GLANDS: Bilateral adrenal  glands demonstrate no acute abnormality. KIDNEYS, URETERS AND BLADDER: Enlarged prostate measuring up to 4.9 cm. No stones in the kidneys or ureters. No hydronephrosis. No perinephric or periureteral stranding. Urinary bladder is unremarkable. GI AND BOWEL: Small hiatal hernia. Stomach and duodenal sweep demonstrate no acute abnormality. No small or large bowel thickening or dilatation. The appendix is unremarkable. Colonic diverticulosis. REPRODUCTIVE: Enlarged prostate measuring up to 4.9 cm. PERITONEUM AND RETROPERITONEUM: No ascites or free air. LYMPH NODES: No lymphadenopathy. BONES AND SOFT TISSUES: Intervertebral disc space vacuum phenomenon at the L5-S1 level. Grade 1 anterolisthesis of L5 on S1. No acute soft tissue abnormality. IMPRESSION: 1. No CT evidence of  active GI hemorrhage. 2. Small hiatal hernia. 3. Colonic diverticulosis without evidence of active diverticulitis. 4. Prostatomegaly. Electronically signed by: Morgane Naveau MD 02/08/2024 03:05 AM EST RP Workstation: [id number]      Impression / Plan:   Assessment: Principal Problem:   ABLA ( acute blood loss anemia) Active Problems:   Chronic systolic heart failure (HCC)   HTN (hypertension)   History of anterior ST elevation myocardial infarction (STEMI)   Ischemic cardiomyopathy   Coronary artery disease of native artery of native heart with stable angina pectoris   Diverticulosis of large intestine with hemorrhage   H/O gastrointestinal diverticular hemorrhage   T2DM (type 2 diabetes mellitus) (HCC)   Lucas [name] is a 68 y.o. y/o male with a history of diverticular bleeding in the past with a report of rectal bleeding that happened yesterday.  The patient has had  no further bleeding overnight but does have some abdominal pain.  The patient also reported mild chest pain on admission.  The patient also had a negative CT angiography.  The patient was treated with IV Protonix  and a GI consult was called.  Plan:  This patient has history of colonoscopy with diverticulosis back in 2022 with a negative EGD at that time.  The patient now comes in with some mild chest pain and a slightly elevated troponin and recurrent bleeding. I have messaged the hospitalist about having the patient evaluated by cardiology but we will set the patient up for a colonoscopy pending cardiology clearance for tomorrow.  The patient and his wife have been explained the plan and agree with it.  PPI IV twice daily  Continue serial CBCs and transfuse PRN Avoid NSAIDs Maintain 2 large-bore IV lines Please page GI with any acute hemodynamic changes, or signs of active GI bleeding  If there is any sign of acute significant lower GI bleeding then a repeat CT angiography and possible vascular surgery consult for embolization can be considered prior to the colonoscopy.  Thank you for involving me in the care of this patient.      LOS: 0 days   Lucas Copping, MD, MD. NOLIA 02/08/2024, 8:18 AM,  Pager [phone] 7am-5pm  Check AMION for 5pm -7am coverage and on weekends   Note: This dictation was prepared with Dragon dictation along with smaller phrase technology. Any transcriptional errors that result from this process are unintentional.

## 2024-02-08 NOTE — Progress Notes (Addendum)
 Interim progress note - not for billing.  I have seen and examined the patient, reviewed the chart, and I agree with the assessment and plan outlined in the history and physical unless stipulated otherwise.   Hx STEMI in 2018 with stent to LAD at that time, HFrEF with EF 40% last year, HTN, DM, hematochezia in 2022 thought to be 2/2 diverticulosis (egd showed gastritis, colonoscopy diverticulosis), on asa and plavix  since 2018 no recent interventions, also takes occasional naproxen, hgb 15s last year, presenting with one week small volume hematochezia and then multiple bouts of large volume last night with syncopal episode. Hgb 10s on arrival, transfused 1 unit. Last bleeding now last night, feeling improved but says has had abdominal pain mild for about a week generalized mid abdomen. CTA no signs active bleed or other acute findings. Ddx includes diverticular bleed, colonic ischemia. Had some chest pain this morning but that is resolved, initial trop in the 100s, EKG no overt ischemia, will check repeat troponin. Gi recs pending for now npo with IV ppi and serial h/h. Probably doesn't need to be on dapt, will plan to touch base w/ cardiology prior to d/c.

## 2024-02-08 NOTE — Assessment & Plan Note (Signed)
 Patient reported mild chest pain In the setting of acute blood loss will get baseline troponin Hold BP lowering GDMT and antiplatelet

## 2024-02-08 NOTE — Assessment & Plan Note (Signed)
 Hold antihypertensives in the setting of acute blood loss anemia

## 2024-02-08 NOTE — Consult Note (Signed)
 Cardiology Consultation   Patient ID: Lucas [name] MRN: [id number]; DOB: 1956/01/20  Admit date: 02/08/2024 Date of Consult: 02/08/2024  PCP:  Berl Todd BROCKS, MD   [facility] Providers Cardiologist:  Lonni Hanson, MD        Patient Profile: Lucas [name] is a 68 y.o. male with a hx of coronary artery disease and chronic systolic heart failure who is being seen 02/08/2024 for the evaluation of chest pain and mildly elevated troponin at the request of Dr. Kandis.  History of Present Illness: [name] is a 68 year old male with known history of coronary artery disease.  He had late presenting anterior STEMI in 2018 and was treated with PCI and drug-eluting stent placement to the LAD.  He is known to have moderate LV systolic dysfunction as a result with other medical problems include diabetes mellitus, essential hypertension and hyperlipidemia.  He has not been seen in our office since 2022.  He was on dual antiplatelet therapy at that time.  I obtained history from him with the help of an interpreter. He presented with GI bleed and significant blood loss anemia.  In that setting, he had substernal chest pain and tightness that lasted for 30 minutes yesterday.  He was found to have mildly elevated troponin.  EKG with no new ischemic changes.  He is currently chest pain-free.  He was seen by GI with plans for endoscopic evaluation pending cardiac clearance.  The history and physical reports that the patient has been on dual antiplatelet therapy.  However, he reports that he has not been taking clopidogrel .  Before getting sick with GI bleed, he was doing well with no exertional chest pain or shortness of breath.   Past Medical History:  Diagnosis Date   Acute ST elevation myocardial infarction (STEMI) involving left anterior descending (LAD) coronary artery (HCC)    a. 01/2017 Ant STEMI/PCI: LAD 20ost, 100p (3.0x23 Xience Sierra DES).   CAD  (coronary artery disease)    a. 01/2017 Ant STEMI/PCI: LM 20d, LAD 20ost, 100p (3.0x23 Xience Sierra DES), 59m, D1 50ost, LCX nl, RCA 30p; b. 11/2017 MV: Intermediate risk w/ 1mm inf ST dep; c. 11/2017 Cath: LM nl, LAD patent prox stent, D1 20ost, LCX nl, RCA 30p. EF 45-50%.   Chronic anticoagulation    DAPT (ASA + clopidogrel )   Chronic combined systolic (congestive) and diastolic (congestive) heart failure (HCC)    a. 01/2018 Echo: EF 30-35%, sev antsept, ant, apical HK. Gr1 DD, mild MR.   Essential hypertension    Hyperlipidemia    a. Myalgias with high dose lipitor - tolerating 40mg  daily.   Ischemic cardiomyopathy    a. 01/2018 Echo: EF 30-35%.   Type II diabetes mellitus (HCC)     Past Surgical History:  Procedure Laterality Date   APPENDECTOMY     COLONOSCOPY N/A 12/18/2020   Procedure: COLONOSCOPY;  Surgeon: Toledo, Ladell POUR, MD;  Location: [facility] ENDOSCOPY;  Service: Gastroenterology;  Laterality: N/A;   CORONARY/GRAFT ACUTE MI REVASCULARIZATION N/A 01/25/2017   Procedure: Coronary/Graft Acute MI Revascularization;  Surgeon: Hanson Lonni, MD;  Location: [facility] INVASIVE CV LAB;  Service: Cardiovascular;  Laterality: N/A;   ESOPHAGOGASTRODUODENOSCOPY N/A 12/18/2020   Procedure: ESOPHAGOGASTRODUODENOSCOPY (EGD);  Surgeon: Toledo, Ladell POUR, MD;  Location: [facility] ENDOSCOPY;  Service: Gastroenterology;  Laterality: N/A;   LEFT HEART CATH AND CORONARY ANGIOGRAPHY N/A 01/25/2017   Procedure: LEFT HEART CATH AND CORONARY ANGIOGRAPHY;  Surgeon: Hanson Lonni, MD;  Location: [facility] INVASIVE CV LAB;  Service: Cardiovascular;  Laterality: N/A;   LEFT HEART CATH AND CORONARY ANGIOGRAPHY N/A 11/20/2017   Procedure: LEFT HEART CATH AND CORONARY ANGIOGRAPHY;  Surgeon: Darron Deatrice LABOR, MD;  Location: [facility] INVASIVE CV LAB;  Service: Cardiovascular;  Laterality: N/A;   XI ROBOTIC ASSISTED INGUINAL HERNIA REPAIR WITH MESH Right 11/06/2020   Procedure: XI ROBOTIC ASSISTED INGUINAL HERNIA REPAIR WITH  MESH;  Surgeon: Desiderio Schanz, MD;  Location: [facility] ORS;  Service: General;  Laterality: Right;     Home Medications:  Prior to Admission medications   Medication Sig Start Date End Date Taking? Authorizing Provider  aspirin  EC 81 MG tablet Take 81 mg by mouth daily. Swallow whole.   Yes [provider]  atorvastatin  (LIPITOR) 40 MG tablet Take 1 tablet (40 mg total) by mouth daily. 12/05/22  Yes   clopidogrel  (PLAVIX ) 75 MG tablet Take 1 tablet (75 mg total) by mouth daily. 12/05/22  Yes   metFORMIN  (GLUCOPHAGE ) 500 MG tablet Take 1 tablet (500 mg total) by mouth 2 (two) times daily with a meal. 12/21/23  Yes   metFORMIN  (GLUCOPHAGE ) 500 MG tablet Take 1 tablet (500 mg total) by mouth 2 (two) times daily with a meal. 02/04/24  Yes   sacubitril -valsartan  (ENTRESTO ) 24-26 MG Take 1 tablet by mouth 2 (two) times daily. 10/22/23  Yes   tiZANidine  (ZANAFLEX ) 2 MG tablet Take 1-2 tablets (2-4 mg total) by mouth daily as needed for pain. 10/22/23  Yes   tiZANidine  (ZANAFLEX ) 2 MG tablet Take 1-2 tablets (2-4 mg total) by mouth 3 (three) times daily as needed for pain. 10/22/23  Yes   sacubitril -valsartan  (ENTRESTO ) 24-26 MG Take 1 tablet by mouth 2 (two) times daily for heart. 02/04/24       Scheduled Meds:  Chlorhexidine  Gluconate Cloth  6 each Topical Q0600   pantoprazole  (PROTONIX ) IV  40 mg Intravenous Q12H   Continuous Infusions:  sodium chloride      PRN Meds: acetaminophen  **OR** acetaminophen , HYDROcodone -acetaminophen , ondansetron  **OR** ondansetron  (ZOFRAN ) IV, mouth rinse  Allergies:   No Known Allergies  Social History:   Social History   Socioeconomic History   Marital status: Married    Spouse name: Not on file   Number of children: Not on file   Years of education: Not on file   Highest education level: Not on file  Occupational History   Occupation: unemployed  Tobacco Use   Smoking status: Never   Smokeless tobacco: Never  Vaping Use   Vaping status: Never Used   Substance and Sexual Activity   Alcohol use: No   Drug use: No   Sexual activity: Yes    Birth control/protection: Coitus interruptus, None  Other Topics Concern   Not on file  Social History Narrative   Not on file   Social Drivers of Health   Financial Resource Strain: Medium Risk (11/16/2019)   Overall Financial Resource Strain (CARDIA)    Difficulty of Paying Living Expenses: Somewhat hard  Food Insecurity: No Food Insecurity (02/08/2024)   Hunger Vital Sign    Worried About Running Out of Food in the Last Year: Never true    Ran Out of Food in the Last Year: Never true  Transportation Needs: No Transportation Needs (02/08/2024)   PRAPARE - Administrator, Civil Service (Medical): No    Lack of Transportation (Non-Medical): No  Physical Activity: Insufficiently Active (11/16/2019)   Exercise Vital Sign    Days of Exercise per Week: 3 days  Minutes of Exercise per Session: 30 min  Stress: No Stress Concern Present (11/16/2019)   [employer] of Occupational Health - Occupational Stress Questionnaire    Feeling of Stress : Not at all  Social Connections: Unknown (02/08/2024)   Social Connection and Isolation Panel    Frequency of Communication with Friends and Family: More than three times a week    Frequency of Social Gatherings with Friends and Family: More than three times a week    Attends Religious Services: Not on file    Active Member of Clubs or Organizations: Not on file    Attends Banker Meetings: Not on file    Marital Status: Not on file  Intimate Partner Violence: Not At Risk (02/08/2024)   Humiliation, Afraid, Rape, and Kick questionnaire    Fear of Current or Ex-Partner: No    Emotionally Abused: No    Physically Abused: No    Sexually Abused: No    Family History:    Family History  Problem Relation Age of Onset   Asthma Mother    Heart disease Father        s/p pacemaker   Arrhythmia Father    Asthma Father    Healthy  Sister      ROS:  Please see the history of present illness.   All other ROS reviewed and negative.     Physical Exam/Data: Vitals:   02/08/24 1300 02/08/24 1400 02/08/24 1500 02/08/24 1600  BP: 110/65 108/78 112/69 107/67  Pulse:    70  Resp: 17 13 13 16   Temp:      TempSrc:    Oral  SpO2:    98%  Weight:      Height:        Intake/Output Summary (Last 24 hours) at 02/08/2024 1750 Last data filed at 02/08/2024 1516 Gross per 24 hour  Intake 2237.67 ml  Output --  Net 2237.67 ml      02/08/2024    2:10 AM 12/27/2022    4:46 PM 08/14/2021   10:02 AM  Last 3 Weights  Weight (lbs) 152 lb 4.8 oz 145 lb 149 lb 6.4 oz  Weight (kg) 69.083 kg 65.772 kg 67.767 kg     Body mass index is 29.74 kg/m.  General:  Well nourished, well developed, in no acute distress HEENT: normal Neck: no JVD Vascular: No carotid bruits; Distal pulses 2+ bilaterally Cardiac:  normal S1, S2; RRR; no murmur  Lungs:  clear to auscultation bilaterally, no wheezing, rhonchi or rales  Abd: soft, nontender, no hepatomegaly  Ext: no edema Musculoskeletal:  No deformities, BUE and BLE strength normal and equal Skin: warm and dry  Neuro:  CNs 2-12 intact, no focal abnormalities noted Psych:  Normal affect   EKG:  The EKG was personally reviewed and demonstrates: Sinus bradycardia with prior anteroseptal infarct. Telemetry:  Telemetry was personally reviewed and demonstrates: Sinus rhythm  Relevant CV Studies:   Laboratory Data: High Sensitivity Troponin:  No results for input(s): TROPONINIHS in the last 720 hours.   Chemistry Recent Labs  Lab 02/08/24 0215  NA 141  K 3.9  CL 111  CO2 23  GLUCOSE 185*  BUN 18  CREATININE 0.99  CALCIUM  7.5*  GFRNONAA >60  ANIONGAP 7    Recent Labs  Lab 02/08/24 0215  PROT 5.1*  ALBUMIN 3.2*  AST 15  ALT 11  ALKPHOS 39  BILITOT 0.2   Lipids No results for input(s): CHOL, TRIG, HDL, LABVLDL, LDLCALC,  CHOLHDL in the last 168 hours.   Hematology Recent Labs  Lab 02/08/24 0215 02/08/24 0949  WBC 5.6 8.7  RBC 3.43* 3.92*  HGB 10.8* 12.2*  HCT 33.4* 35.0*  MCV 97.4 89.3  MCH 31.5 31.1  MCHC 32.3 34.9  RDW 12.1 13.0  PLT 174 174   Thyroid No results for input(s): TSH, FREET4 in the last 168 hours.  BNPNo results for input(s): BNP, PROBNP in the last 168 hours.  DDimer No results for input(s): DDIMER in the last 168 hours.  Radiology/Studies:  CT ANGIO GI BLEED Result Date: 02/08/2024 EXAM: CTA ABDOMEN AND PELVIS WITH CONTRAST 02/08/2024 02:50:29 AM TECHNIQUE: CTA images of the abdomen and pelvis with intravenous contrast. 100 mL (iohexol  (OMNIPAQUE ) 350 MG/ML injection 100 mL IOHEXOL  350 MG/ML SOLN) was administered. Three-dimensional MIP/volume rendered formations were performed. Automated exposure control, iterative reconstruction, and/or weight based adjustment of the mA/kV was utilized to reduce the radiation dose to as low as reasonably achievable. COMPARISON: None available. CLINICAL HISTORY: LGIB pale hypotension FINDINGS: VASCULATURE: GI BLEED: No extravasation of intravenous contrast within the lumen of the bowel. AORTA: Atherosclerotic plaque. No acute finding. No abdominal aortic aneurysm. No dissection. CELIAC TRUNK: No acute finding. No occlusion or significant stenosis. SUPERIOR MESENTERIC ARTERY: No acute finding. No occlusion or significant stenosis. INFERIOR MESENTERIC ARTERY: No acute finding. No occlusion or significant stenosis. RENAL ARTERIES: No acute finding. No occlusion or significant stenosis. ILIAC ARTERIES: No acute finding. No occlusion or significant stenosis. ABDOMEN/PELVIS: LOWER CHEST: Visualized portion of the lower chest demonstrates no acute abnormality. LIVER: The liver is unremarkable. GALLBLADDER AND BILE DUCTS: Gallbladder is unremarkable. No biliary ductal dilatation. SPLEEN: The spleen is unremarkable. PANCREAS: The pancreas is unremarkable. ADRENAL GLANDS: Bilateral adrenal  glands demonstrate no acute abnormality. KIDNEYS, URETERS AND BLADDER: Enlarged prostate measuring up to 4.9 cm. No stones in the kidneys or ureters. No hydronephrosis. No perinephric or periureteral stranding. Urinary bladder is unremarkable. GI AND BOWEL: Small hiatal hernia. Stomach and duodenal sweep demonstrate no acute abnormality. No small or large bowel thickening or dilatation. The appendix is unremarkable. Colonic diverticulosis. REPRODUCTIVE: Enlarged prostate measuring up to 4.9 cm. PERITONEUM AND RETROPERITONEUM: No ascites or free air. LYMPH NODES: No lymphadenopathy. BONES AND SOFT TISSUES: Intervertebral disc space vacuum phenomenon at the L5-S1 level. Grade 1 anterolisthesis of L5 on S1. No acute soft tissue abnormality. IMPRESSION: 1. No CT evidence of  active GI hemorrhage. 2. Small hiatal hernia. 3. Colonic diverticulosis without evidence of active diverticulitis. 4. Prostatomegaly. Electronically signed by: Morgane Naveau MD 02/08/2024 03:05 AM EST RP Workstation: [id number]     Assessment and Plan: Chest pain and elevated troponin: Likely due to supply/demand ischemia in the setting of significant blood loss.  No new ischemic changes on his EKG but he is known to have coronary artery disease with prior breast anterior STEMI and drug-eluting stent placement to the LAD.  Will obtain an echocardiogram to ensure stability of ejection fraction.  As long as his ejection fraction is stable and he has no further episodes of chest pain, he can proceed with GI endoscopic evaluation from a cardiac standpoint.  Recommend resuming low-dose aspirin  once bleeding is controlled. Chronic systolic heart failure: Moderately reduced LV systolic function with an EF of 40 to 45%.  Heart failure medications are currently on hold in the setting of hypotension due to blood loss anemia.  He is supposed to be on carvedilol , Entresto  and spironolactone . Hyperlipidemia: Continue atorvastatin .  For questions or  updates, please  contact [facility] Please consult [email or web address] for contact info under      Signed, Deatrice Cage, MD  02/08/2024 5:50 PM

## 2024-02-08 NOTE — Plan of Care (Signed)

## 2024-02-08 NOTE — Assessment & Plan Note (Addendum)
 Ischemic cardiomyopathy Clinically euvolemic Holding all oral meds for now Monitor for fluid overload in view of IV fluid resuscitation and blood transfusion Daily weights

## 2024-02-08 NOTE — ED Triage Notes (Signed)
 Pt here via EMS for rectal bleeding for the past 2 days. Pt notes the bleeding has worsened over the past 24 hrs, and he's starting to have D mixed with blood. Denies blood thinners, trauma, and bleeding disorders. EMS noted some hypotension with SBP in low 80's that dropped into the high 70's. EMS started 500 mL NS. BP stable in triage. Pt appears pale and lethargic, but is A&Ox4. MD at bedside.

## 2024-02-09 ENCOUNTER — Observation Stay
Admit: 2024-02-09 | Discharge: 2024-02-09 | Disposition: A | Attending: Obstetrics and Gynecology | Admitting: Obstetrics and Gynecology

## 2024-02-09 ENCOUNTER — Encounter: Admission: EM | Disposition: A | Payer: Self-pay | Source: Home / Self Care | Attending: Emergency Medicine

## 2024-02-09 ENCOUNTER — Encounter: Payer: Self-pay | Admitting: Internal Medicine

## 2024-02-09 ENCOUNTER — Observation Stay: Admitting: Anesthesiology

## 2024-02-09 DIAGNOSIS — K635 Polyp of colon: Secondary | ICD-10-CM

## 2024-02-09 DIAGNOSIS — D62 Acute posthemorrhagic anemia: Secondary | ICD-10-CM | POA: Diagnosis not present

## 2024-02-09 HISTORY — PX: POLYPECTOMY: SHX149

## 2024-02-09 HISTORY — PX: COLONOSCOPY: SHX5424

## 2024-02-09 LAB — ECHOCARDIOGRAM COMPLETE
AR max vel: 3.12 cm2
AV Area VTI: 3.08 cm2
AV Area mean vel: 3.04 cm2
AV Mean grad: 3 mmHg
AV Peak grad: 5.7 mmHg
Ao pk vel: 1.19 m/s
Area-P 1/2: 3.27 cm2
Calc EF: 72.5 %
Height: 60 in
MV VTI: 2.48 cm2
S' Lateral: 3.6 cm
Single Plane A2C EF: 79.5 %
Single Plane A4C EF: 65 %
Weight: 2436.8 [oz_av]

## 2024-02-09 LAB — URINALYSIS, W/ REFLEX TO CULTURE (INFECTION SUSPECTED)
Bilirubin Urine: NEGATIVE
Glucose, UA: NEGATIVE mg/dL
Hgb urine dipstick: NEGATIVE
Ketones, ur: 5 mg/dL — AB
Leukocytes,Ua: NEGATIVE
Nitrite: NEGATIVE
Protein, ur: NEGATIVE mg/dL
Specific Gravity, Urine: 1.018 (ref 1.005–1.030)
Squamous Epithelial / HPF: 0 /HPF (ref 0–5)
pH: 8 (ref 5.0–8.0)

## 2024-02-09 LAB — TYPE AND SCREEN
ABO/RH(D): O POS
Antibody Screen: NEGATIVE
Unit division: 0
Unit division: 0
Unit division: 0

## 2024-02-09 LAB — BPAM RBC
Blood Product Expiration Date: 202512202359
Blood Product Expiration Date: 202512202359
ISSUE DATE / TIME: 202512080212
ISSUE DATE / TIME: 202512081004
Unit Type and Rh: 5100
Unit Type and Rh: 5100

## 2024-02-09 LAB — BASIC METABOLIC PANEL WITH GFR
Anion gap: 6 (ref 5–15)
BUN: 7 mg/dL — ABNORMAL LOW (ref 8–23)
CO2: 25 mmol/L (ref 22–32)
Calcium: 8.5 mg/dL — ABNORMAL LOW (ref 8.9–10.3)
Chloride: 111 mmol/L (ref 98–111)
Creatinine, Ser: 0.69 mg/dL (ref 0.61–1.24)
GFR, Estimated: >60 mL/min (ref >60)
Glucose, Bld: 116 mg/dL — ABNORMAL HIGH (ref 70–99)
Potassium: 3.9 mmol/L (ref 3.5–5.1)
Sodium: 142 mmol/L (ref 135–145)

## 2024-02-09 LAB — CBC
HCT: 31.7 % — ABNORMAL LOW (ref 39.0–52.0)
Hemoglobin: 11 g/dL — ABNORMAL LOW (ref 13.0–17.0)
MCH: 31.2 pg (ref 26.0–34.0)
MCHC: 34.7 g/dL (ref 30.0–36.0)
MCV: 89.8 fL (ref 80.0–100.0)
Platelets: 162 K/uL (ref 150–400)
RBC: 3.53 MIL/uL — ABNORMAL LOW (ref 4.22–5.81)
RDW: 13.1 % (ref 11.5–15.5)
WBC: 6.6 K/uL (ref 4.0–10.5)
nRBC: 0 % (ref 0.0–0.2)

## 2024-02-09 SURGERY — COLONOSCOPY
Anesthesia: General

## 2024-02-09 MED ORDER — EPHEDRINE SULFATE (PRESSORS) 25 MG/5ML IV SOSY
PREFILLED_SYRINGE | INTRAVENOUS | Status: DC | PRN
  Administered 2024-02-09: 5 mg via INTRAVENOUS

## 2024-02-09 MED ORDER — PROPOFOL 500 MG/50ML IV EMUL
INTRAVENOUS | Status: DC | PRN
  Administered 2024-02-09: 150 ug/kg/min via INTRAVENOUS

## 2024-02-09 MED ORDER — SODIUM CHLORIDE 0.9 % IV SOLN: INTRAVENOUS | Status: DC | PRN

## 2024-02-09 MED ORDER — PERFLUTREN LIPID MICROSPHERE
1.0000 mL | INTRAVENOUS | Status: AC | PRN
  Administered 2024-02-09: 2 mL via INTRAVENOUS

## 2024-02-09 MED ORDER — PROPOFOL 10 MG/ML IV BOLUS
INTRAVENOUS | Status: DC | PRN
  Administered 2024-02-09: 60 mg via INTRAVENOUS

## 2024-02-09 MED ORDER — ASPIRIN 81 MG PO CHEW: 81.0000 mg | CHEWABLE_TABLET | Freq: Every day | ORAL | Status: DC

## 2024-02-09 MED ORDER — CHLORHEXIDINE GLUCONATE CLOTH 2 % EX PADS
6.0000 | MEDICATED_PAD | Freq: Every day | CUTANEOUS | Status: DC
  Administered 2024-02-09: 6 via TOPICAL

## 2024-02-09 MED ORDER — ATORVASTATIN CALCIUM 20 MG PO TABS: 40.0000 mg | ORAL_TABLET | Freq: Every day | ORAL | Status: DC

## 2024-02-09 NOTE — Progress Notes (Signed)
  Progress Note  Patient Name: Lucas [name] Date of Encounter: 02/09/2024 [facility] Cardiologist: Lonni Hanson, MD   Interval Summary   Feels well without chest pain or shortness of breath.  Colonoscopy today showed two small polyps and diverticulosis without ongoing bleeding.  Vital Signs Vitals:   02/09/24 1254 02/09/24 1304 02/09/24 1337 02/09/24 1400  BP: (!) 113/49 112/80 115/69 (!) 88/66  Pulse: 81 79 71 79  Resp: 18 15 15 18   Temp: (!) 97 F (36.1 C)  98 F (36.7 C)   TempSrc: Temporal  Oral   SpO2: 99% 100% 99% 100%  Weight:      Height:        Intake/Output Summary (Last 24 hours) at 02/09/2024 1458 Last data filed at 02/09/2024 1243 Gross per 24 hour  Intake 960 ml  Output 3000 ml  Net -2040 ml      02/08/2024    2:10 AM 12/27/2022    4:46 PM 08/14/2021   10:02 AM  Last 3 Weights  Weight (lbs) 152 lb 4.8 oz 145 lb 149 lb 6.4 oz  Weight (kg) 69.083 kg 65.772 kg 67.767 kg      Telemetry/ECG  NSR - Personally Reviewed  Physical Exam  GEN: No acute distress.   Neck: No JVD Cardiac: RRR, no murmurs, rubs, or gallops.  Respiratory: Clear to auscultation bilaterally. GI: Soft, nontender, non-distended  MS: No edema  Assessment & Plan  Acute GI bleed: Etiology unclear but thought to be due to diverticular bleed that has since stopped, given that there was no evidence of active bleeding on today's colonoscopy.  Patient also notes that he was taking some sort of a pain medication from [place] that can predispose to bleeding.  He has been instructed to stop.  Defer need for continued PPI therapy to IM and GI.  We will resume aspirin  81 mg daily, which has been cleared by Dr.  Jinny.  Coronary artery disease: No further angina reported.  Suspect that he may have had some LAD-demand mismatch leading to chest pain and mildly elevated troponin on initial presentation in the setting of GI bleed.  We will resume aspirin  81 mg daily and  atorvastatin  40 mg daily.  Heart failure with improved ejection fraction: Patient with history of severely reduced LVEF in the setting of late presenting STEMI in 2019, with echo earlier today showing significant improvement in LVEF to 55 to 55% with only a subtle apical wall motion abnormality.  Given intermittent soft blood pressure, will defer resumption of Entresto .  Could consider resuming Entresto  and/or adding beta-blocker at follow-up.  [facility] will sign off.   The patient is ready for discharge today from a cardiac standpoint. Medication Recommendations:  Resume aspirin  81 mg daily and atorvastatin  40 mg daily tomorrow. Other recommendations (labs, testing, etc):  None Follow up as an outpatient:  4-6 weeks with me or APP. For questions or updates, please contact [facility]  Please consult [email or web address] for contact info under [facility].  Signed, Lonni Hanson, MD

## 2024-02-09 NOTE — Op Note (Signed)
 [facility] Patient Name: Lucas [name] Procedure Date: 02/09/2024 12:16 PM MRN: [id number] Account #: [id number] Date of Birth: 05-26-1955 Admit Type: Inpatient Age: 68 Room: [facility] ENDO ROOM 4 Gender: Male Note Status: Finalized Instrument Name: Colon Scope [id number] Procedure:             Colonoscopy Indications:           Hematochezia Providers:             [name] MD, MD Medicines:             Propofol  per Anesthesia Complications:         No immediate complications. Procedure:             Pre-Anesthesia Assessment:                        - Prior to the procedure, a History and Physical was                         performed, and patient medications and allergies were                         reviewed. The patient's tolerance of previous                         anesthesia was also reviewed. The risks and benefits                         of the procedure and the sedation options and risks                         were discussed with the patient. All questions were                         answered, and informed consent was obtained. Prior                         Anticoagulants: The patient has taken no anticoagulant                         or antiplatelet agents. ASA Grade Assessment: II - A                         patient with mild systemic disease. After reviewing                         the risks and benefits, the patient was deemed in                         satisfactory condition to undergo the procedure.                        After obtaining informed consent, the colonoscope was                         passed under direct vision. Throughout the procedure,                         the patient's blood pressure, pulse,  and oxygen                         saturations were monitored continuously. The                         Colonoscope was introduced through the anus and                         advanced to the the cecum, identified by  appendiceal                         orifice and ileocecal valve. The colonoscopy was                         performed without difficulty. The patient tolerated                         the procedure well. The quality of the bowel                         preparation was good. Findings:      The perianal and digital rectal examinations were normal.      A 2 mm polyp was found in the cecum. The polyp was sessile. The polyp       was removed with a cold biopsy forceps. Resection and retrieval were       complete.      A 2 mm polyp was found in the ascending colon. The polyp was sessile.       The polyp was removed with a cold biopsy forceps. Resection and       retrieval were complete.      Multiple small-mouthed diverticula were found in the sigmoid colon and       descending colon.      Non-bleeding external hemorrhoids were found during retroflexion. The       hemorrhoids were Grade I (internal hemorrhoids that do not prolapse). Impression:            - One 2 mm polyp in the cecum, removed with a cold                         biopsy forceps. Resected and retrieved.                        - One 2 mm polyp in the ascending colon, removed with                         a cold biopsy forceps. Resected and retrieved.                        - Diverticulosis in the sigmoid colon and in the                         descending colon.                        - Non-bleeding external hemorrhoids. Recommendation:        - Discharge patient to home.                        -  Resume previous diet.                        - Continue present medications.                        - Await pathology results. Procedure Code(s):     --- Professional ---                        [phone], Colonoscopy, flexible; with biopsy, single or                         multiple Diagnosis Code(s):     --- Professional ---                        K92.1, Melena (includes Hematochezia)                        D12.0, Benign neoplasm of  cecum CPT copyright 2022 American Medical Association. All rights reserved. The codes documented in this report are preliminary and upon coder review may  be revised to meet current compliance requirements. [name] MD, MD 02/09/2024 12:48:38 PM This report has been signed electronically. Number of Addenda: 0 Note Initiated On: 02/09/2024 12:16 PM Scope Withdrawal Time: 0 hours 7 minutes 55 seconds  Total Procedure Duration: 0 hours 11 minutes 34 seconds  Estimated Blood Loss:  Estimated blood loss: none.      [facility]

## 2024-02-09 NOTE — Progress Notes (Signed)
 The patient underwent a colonoscopy with diverticulosis found during the colonoscopy.  There were 2 polyps found in the colon that were in the cecum and ascending colon.  The majority of the diverticuli were on the descending and sigmoid colon.  No source of other bleeding was seen.  This patient likely suffered a diverticular bleed.  If the patient should have any further bleeding then a CT angiography versus angiography with vascular surgery for embolization would be the next step nothing further to do from a GI point of view.  I will sign off.  Please call if any further GI concerns or questions.  We would like to thank you for the opportunity to participate in the care of Lucas [name].

## 2024-02-09 NOTE — Plan of Care (Signed)
  Problem: Education: Goal: Knowledge of General Education information will improve Description: Including pain rating scale, medication(s)/side effects and non-pharmacologic comfort measures Outcome: Progressing   Problem: Activity: Goal: Risk for activity intolerance will decrease Outcome: Progressing   Problem: Coping: Goal: Level of anxiety will decrease Outcome: Progressing   Problem: Pain Managment: Goal: General experience of comfort will improve and/or be controlled Outcome: Progressing   Problem: Safety: Goal: Ability to remain free from injury will improve Outcome: Progressing   Problem: Health Behavior/Discharge Planning: Goal: Ability to manage health-related needs will improve Outcome: Not Progressing   Problem: Clinical Measurements: Goal: Diagnostic test results will improve Outcome: Not Progressing

## 2024-02-09 NOTE — Progress Notes (Signed)
 Lucas [name] to be D/C'd Home per MD order.  Discussed prescriptions and follow up appointments with the patient. Prescriptions given to patient, medication list explained in detail. Pt verbalized understanding.  Allergies as of 02/09/2024   No Known Allergies      Medication List     PAUSE taking these medications    Entresto  24-26 MG Wait to take this until your doctor or other care provider tells you to start again. Generic drug: sacubitril -valsartan  Tomar 1 comprimido por va oral 2 (dos) veces al c.h. robinson worldwide. (Take 1 tablet by mouth 2 (two) times daily.)       STOP taking these medications    clopidogrel  75 MG tablet Commonly known as: PLAVIX        TAKE these medications    aspirin  EC 81 MG tablet Take 81 mg by mouth daily. Swallow whole.   atorvastatin  40 MG tablet Commonly known as: LIPITOR Tome 1 tableta (40 mg en total) por va oral diariamente. (Take 1 tablet (40 mg total) by mouth daily.)   metFORMIN  500 MG tablet Commonly known as: GLUCOPHAGE  Tome 1 tableta (500 mg en total) por va oral 2 (dos) veces al da con una comida. (Take 1 tablet (500 mg total) by mouth 2 (two) times daily with a meal.) What changed: Another medication with the same name was removed. Continue taking this medication, and follow the directions you see here.   tiZANidine  2 MG tablet Commonly known as: ZANAFLEX  Tome 1-2 comprimidos (2-4 mg en total) por va oral diariamente segn sea necesario para el dolor. (Take 1-2 tablets (2-4 mg total) by mouth daily as needed for pain.) What changed: Another medication with the same name was removed. Continue taking this medication, and follow the directions you see here.        Skin clean, dry and intact without evidence of skin break down, no evidence of skin tears noted. IV catheter discontinued intact. Site without signs and symptoms of complications. Dressing and pressure applied. Pt denies pain at this time. No complaints  noted.  An After Visit Summary was printed and given to the patient. Patient escorted via WC, and D/C home via private auto.  Lucas [name]

## 2024-02-09 NOTE — Transfer of Care (Signed)
 Immediate Anesthesia Transfer of Care Note  Patient: Lucas [name]  Procedure(s) Performed: COLONOSCOPY  Patient Location: PACU  Anesthesia Type:General  Level of Consciousness: drowsy and patient cooperative  Airway & Oxygen Therapy: Patient Spontanous Breathing  Post-op Assessment: Report given to RN and Post -op Vital signs reviewed and stable  Post vital signs: stable  Last Vitals:  Vitals Value Taken Time  BP 113/49 02/09/24 12:54  Temp    Pulse 85 02/09/24 12:54  Resp 16 02/09/24 12:54  SpO2 100 % 02/09/24 12:54  Vitals shown include unfiled device data.  Last Pain:  Vitals:   02/09/24 0800  TempSrc: Oral  PainSc: 0-No pain      Patients Stated Pain Goal: 3 (02/08/24 0434)  Complications: There were no known notable events for this encounter.

## 2024-02-09 NOTE — Plan of Care (Signed)

## 2024-02-09 NOTE — Anesthesia Postprocedure Evaluation (Signed)
 Anesthesia Post Note  Patient: Lucas [name]  Procedure(s) Performed: COLONOSCOPY  Patient location during evaluation: PACU Anesthesia Type: General Level of consciousness: awake Pain management: pain level controlled Vital Signs Assessment: post-procedure vital signs reviewed and stable Respiratory status: spontaneous breathing and nonlabored ventilation Cardiovascular status: stable Anesthetic complications: no   There were no known notable events for this encounter.   Last Vitals:  Vitals:   02/09/24 1254 02/09/24 1304  BP: (!) 113/49 112/80  Pulse: 81 79  Resp: 18 15  Temp: (!) 36.1 C   SpO2: 99% 100%    Last Pain:  Vitals:   02/09/24 1304  TempSrc:   PainSc: 0-No pain                 VAN STAVEREN,Brittie Whisnant

## 2024-02-09 NOTE — Discharge Summary (Signed)
 Lucas [name] FMW:[id number] DOB: 1955/09/20 DOA: 02/08/2024  PCP: Berl Todd BROCKS, MD  Admit date: 02/08/2024 Discharge date: 02/09/2024  Time spent: 35 minutes  Recommendations for Outpatient Follow-up:  Pcp f/u 1 week Cardiology f/u 3 weeks     Discharge Diagnoses:  Principal Problem:   ABLA ( acute blood loss anemia) Active Problems:   Diverticulosis of large intestine with hemorrhage   H/O gastrointestinal diverticular hemorrhage   History of anterior ST elevation myocardial infarction (STEMI)   Chronic systolic heart failure (HCC)   HTN (hypertension)   Ischemic cardiomyopathy   Coronary artery disease of native artery of native heart with stable angina pectoris   T2DM (type 2 diabetes mellitus) (HCC)   Demand ischemia (HCC)   Polyp of ascending colon   Discharge Condition: improved  Diet recommendation: heart healthy  Filed Weights   02/08/24 0210  Weight: 69.1 kg    History of present illness:  From admission h and p  Lucas [name] is a 68 y.o. male with medical history significant for CAD, STEMI, HFrEF with grade I diastolic dysfunction, controlled prediabetes, diverticular bleed in 2022, being admitted with a diverticular bleed resulting in acute blood loss and syncopal event.  History provided by wife at bedside who states that patient started bleeding on the night of 12/7 and had multiple visits, over 5 to the bathroom and the commode was filled with blood each time.  He then passed out following one of the bowel movements but she was able to hold him and eased him down to the floor.  She states she was out for about 3 minutes.  At this time they called EMS.  Patient complains of lower abdominal and back pain. On arrival of EMS he was hypotensive in the 70s. By arrival to the ED BP 93/60 and heart rate 59.  BP improved to 117/70 with 2 L NS bolus and initiation of a unit of emergency PRBC Hemoglobin 10 down from 15.6 a year ago.  Other labs  unremarkable.   CT angio bleed without active bleeding  Hospital Course:  Patient presents with symptomatic hematochezia. Transfused 1 unit of blood, hemodynamically stable, bleeding resolved, hgb has settled at around 11. Colonoscopy performed showing diverticulosis, the likely source of bleed (CTA was negative). Patient is on asa/plavix  and takes occasional naproxen. Had LAD stent placed in 2018, also had likely diverticular bleed in 2022, cardiology thinks safe to stop plavix  so that will be held and patient will also be advised no nsaids. Patient did have some chest pain on arrival that resolved, with troponin elevation. TTE shows improved EF from prior, cardiology thinks this is likely demand, no additional inpatient w/u advised, they do advise holding home entresto  pending outpt f/u. Consider also outpatient transition of metformin  to sglt2 inhibitor given cad and history chf. Cardiology advising f/u in a few weeks, we have also advised f/u with pcp in about a week. Patient feels well and is anxious to discharge. D/c plan reviewed with wife and son who are in agreement.   Procedures: colonoscopy   Consultations: GI cardiology  Discharge Exam: Vitals:   02/09/24 1400 02/09/24 1500  BP: (!) 88/66 116/80  Pulse: 79 86  Resp: 18 (!) 27  Temp:    SpO2: 100% 99%    General: NAD Cardiovascular: RRR Respiratory: CTAB Abdomen: soft, non-tender  Discharge Instructions   Discharge Instructions     Diet - low sodium heart healthy   Complete by: As directed  Increase activity slowly   Complete by: As directed       Allergies as of 02/09/2024   No Known Allergies      Medication List     PAUSE taking these medications    Entresto  24-26 MG Wait to take this until your doctor or other care provider tells you to start again. Generic drug: sacubitril -valsartan  Tomar 1 comprimido por va oral 2 (dos) veces al c.h. robinson worldwide. (Take 1 tablet by mouth 2 (two) times daily.)       STOP  taking these medications    clopidogrel  75 MG tablet Commonly known as: PLAVIX        TAKE these medications    aspirin  EC 81 MG tablet Take 81 mg by mouth daily. Swallow whole.   atorvastatin  40 MG tablet Commonly known as: LIPITOR Tome 1 tableta (40 mg en total) por va oral diariamente. (Take 1 tablet (40 mg total) by mouth daily.)   metFORMIN  500 MG tablet Commonly known as: GLUCOPHAGE  Tome 1 tableta (500 mg en total) por va oral 2 (dos) veces al da con una comida. (Take 1 tablet (500 mg total) by mouth 2 (two) times daily with a meal.) What changed: Another medication with the same name was removed. Continue taking this medication, and follow the directions you see here.   tiZANidine  2 MG tablet Commonly known as: ZANAFLEX  Tome 1-2 comprimidos (2-4 mg en total) por va oral diariamente segn sea necesario para el dolor. (Take 1-2 tablets (2-4 mg total) by mouth daily as needed for pain.) What changed: Another medication with the same name was removed. Continue taking this medication, and follow the directions you see here.       No Known Allergies  Follow-up Information     End, Lonni, MD Follow up.   Specialty: Cardiology Why: 3 weeks Contact information: [address] [place] [address] [phone]         [facility] Follow up.   Specialty: General Practice Contact information: [address] [place] [address] [phone]                  The results of significant diagnostics from this hospitalization (including imaging, microbiology, ancillary and laboratory) are listed below for reference.    Significant Diagnostic Studies: ECHOCARDIOGRAM COMPLETE Result Date: 02/09/2024    ECHOCARDIOGRAM REPORT   Patient Name:   Lucas [name] Date of Exam: 02/09/2024 Medical Rec #:  [id number]                Height:       60.0 in Accession #:    [id number]               Weight:       152.3  lb Date of Birth:  [birth year]                BSA:          1.663 m Patient Age:    68 years                 BP:           111/68 mmHg Patient Gender: M                        HR:           61 bpm. Exam Location:  [facility] Procedure: 2D Echo, Cardiac Doppler, Color Doppler and Intracardiac  Opacification Agent (Both Spectral and Color Flow Doppler were            utilized during procedure). Indications:     Elevated Troponin  History:         Patient has prior history of Echocardiogram examinations, most                  recent 12/17/2020.  Sonographer:     Ashley McNeely-Sloane Referring Phys:  [id number] Doyne Micke BEDFORD Cagney Steenson Diagnosing Phys: Lonni Hanson MD IMPRESSIONS  1. Left ventricular ejection fraction, by estimation, is 50 to 55%. The left ventricle has low normal function. The left ventricle demonstrates regional wall motion abnormalities (see scoring diagram/findings for description). There is moderate left ventricular hypertrophy. Left ventricular diastolic parameters are consistent with Grade I diastolic dysfunction (impaired relaxation). There is severe hypokinesis of the left ventricular, apical septal wall and apical segment.  2. Right ventricular systolic function is normal. The right ventricular size is mildly enlarged.  3. The mitral valve was not well visualized. Trivial mitral valve regurgitation.  4. The aortic valve is tricuspid. Aortic valve regurgitation is not visualized. No aortic stenosis is present. FINDINGS  Left Ventricle: Left ventricular ejection fraction, by estimation, is 50 to 55%. The left ventricle has low normal function. The left ventricle demonstrates regional wall motion abnormalities. Severe hypokinesis of the left ventricular, apical septal wall and apical segment. Definity  contrast agent was given IV to delineate the left ventricular endocardial borders. The left ventricular internal cavity size was normal in size. There is moderate left ventricular hypertrophy. Left  ventricular diastolic parameters are consistent with Grade I diastolic dysfunction (impaired relaxation). Right Ventricle: The right ventricular size is mildly enlarged. No increase in right ventricular wall thickness. Right ventricular systolic function is normal. Left Atrium: Left atrial size was normal in size. Right Atrium: Right atrial size was normal in size. Pericardium: There is no evidence of pericardial effusion. Mitral Valve: The mitral valve was not well visualized. Trivial mitral valve regurgitation. MV peak gradient, 3.3 mmHg. The mean mitral valve gradient is 1.0 mmHg. Tricuspid Valve: The tricuspid valve is grossly normal. Tricuspid valve regurgitation is trivial. Aortic Valve: The aortic valve is tricuspid. There is mild aortic valve annular calcification. Aortic valve regurgitation is not visualized. No aortic stenosis is present. Aortic valve mean gradient measures 3.0 mmHg. Aortic valve peak gradient measures 5.7 mmHg. Aortic valve area, by VTI measures 3.08 cm. Pulmonic Valve: The pulmonic valve was not well visualized. Pulmonic valve regurgitation is not visualized. No evidence of pulmonic stenosis. Aorta: The aortic root is normal in size and structure. Pulmonary Artery: The pulmonary artery is not well seen. Venous: The inferior vena cava was not well visualized. IAS/Shunts: The interatrial septum was not well visualized.  LEFT VENTRICLE PLAX 2D LVIDd:         3.90 cm     Diastology LVIDs:         3.60 cm     LV e' medial:    6.85 cm/s LV PW:         1.50 cm     LV E/e' medial:  9.4 LV IVS:        1.30 cm     LV e' lateral:   8.81 cm/s LVOT diam:     2.35 cm     LV E/e' lateral: 7.3 LV SV:         77 LV SV Index:   46 LVOT Area:     4.34  cm LV IVRT:       134 msec  LV Volumes (MOD) LV vol d, MOD A2C: 88.5 ml LV vol d, MOD A4C: 85.7 ml LV vol s, MOD A2C: 18.1 ml LV vol s, MOD A4C: 30.0 ml LV SV MOD A2C:     70.4 ml LV SV MOD A4C:     85.7 ml LV SV MOD BP:      64.2 ml RIGHT VENTRICLE RV  Basal diam:  4.30 cm  PULMONARY VEINS RV Mid diam:    2.60 cm  A Reversal Duration: 165.00 msec TAPSE (M-mode): 2.3 cm   A Reversal Velocity: 34.10 cm/s                          Diastolic Velocity:  33.70 cm/s                          S/D Velocity:        1.50                          Systolic Velocity:   50.00 cm/s LEFT ATRIUM             Index        RIGHT ATRIUM           Index LA diam:        3.60 cm 2.17 cm/m   RA Area:     13.20 cm LA Vol (A2C):   54.7 ml 32.90 ml/m  RA Volume:   30.90 ml  18.59 ml/m LA Vol (A4C):   37.9 ml 22.80 ml/m LA Biplane Vol: 45.8 ml 27.55 ml/m  AORTIC VALVE                    PULMONIC VALVE AV Area (Vmax):    3.12 cm     PV Vmax:        0.74 m/s AV Area (Vmean):   3.04 cm     PV Vmean:       50.400 cm/s AV Area (VTI):     3.08 cm     PV VTI:         0.168 m AV Vmax:           119.00 cm/s  PV Peak grad:   2.2 mmHg AV Vmean:          80.700 cm/s  PV Mean grad:   1.0 mmHg AV VTI:            0.251 m      RVOT Peak grad: 1 mmHg AV Peak Grad:      5.7 mmHg AV Mean Grad:      3.0 mmHg LVOT Vmax:         85.70 cm/s LVOT Vmean:        56.600 cm/s LVOT VTI:          0.178 m LVOT/AV VTI ratio: 0.71  AORTA Ao Root diam: 3.40 cm MITRAL VALVE MV Area (PHT): 3.27 cm    SHUNTS MV Area VTI:   2.48 cm    Systemic VTI:  0.18 m MV Peak grad:  3.3 mmHg    Systemic Diam: 2.35 cm MV Mean grad:  1.0 mmHg    Pulmonic VTI:  0.140 m MV Vmax:       0.90 m/s MV Vmean:      56.7 cm/s  MV Decel Time: 232 msec MV E velocity: 64.70 cm/s MV A velocity: 80.60 cm/s MV E/A ratio:  0.80 Christopher End MD Electronically signed by Lonni Hanson MD Signature Date/Time: 02/09/2024/11:24:38 AM    Final    CT ANGIO GI BLEED Result Date: 02/08/2024 EXAM: CTA ABDOMEN AND PELVIS WITH CONTRAST 02/08/2024 02:50:29 AM TECHNIQUE: CTA images of the abdomen and pelvis with intravenous contrast. 100 mL (iohexol  (OMNIPAQUE ) 350 MG/ML injection 100 mL IOHEXOL  350 MG/ML SOLN) was administered. Three-dimensional MIP/volume  rendered formations were performed. Automated exposure control, iterative reconstruction, and/or weight based adjustment of the mA/kV was utilized to reduce the radiation dose to as low as reasonably achievable. COMPARISON: None available. CLINICAL HISTORY: LGIB pale hypotension FINDINGS: VASCULATURE: GI BLEED: No extravasation of intravenous contrast within the lumen of the bowel. AORTA: Atherosclerotic plaque. No acute finding. No abdominal aortic aneurysm. No dissection. CELIAC TRUNK: No acute finding. No occlusion or significant stenosis. SUPERIOR MESENTERIC ARTERY: No acute finding. No occlusion or significant stenosis. INFERIOR MESENTERIC ARTERY: No acute finding. No occlusion or significant stenosis. RENAL ARTERIES: No acute finding. No occlusion or significant stenosis. ILIAC ARTERIES: No acute finding. No occlusion or significant stenosis. ABDOMEN/PELVIS: LOWER CHEST: Visualized portion of the lower chest demonstrates no acute abnormality. LIVER: The liver is unremarkable. GALLBLADDER AND BILE DUCTS: Gallbladder is unremarkable. No biliary ductal dilatation. SPLEEN: The spleen is unremarkable. PANCREAS: The pancreas is unremarkable. ADRENAL GLANDS: Bilateral adrenal glands demonstrate no acute abnormality. KIDNEYS, URETERS AND BLADDER: Enlarged prostate measuring up to 4.9 cm. No stones in the kidneys or ureters. No hydronephrosis. No perinephric or periureteral stranding. Urinary bladder is unremarkable. GI AND BOWEL: Small hiatal hernia. Stomach and duodenal sweep demonstrate no acute abnormality. No small or large bowel thickening or dilatation. The appendix is unremarkable. Colonic diverticulosis. REPRODUCTIVE: Enlarged prostate measuring up to 4.9 cm. PERITONEUM AND RETROPERITONEUM: No ascites or free air. LYMPH NODES: No lymphadenopathy. BONES AND SOFT TISSUES: Intervertebral disc space vacuum phenomenon at the L5-S1 level. Grade 1 anterolisthesis of L5 on S1. No acute soft tissue abnormality.  IMPRESSION: 1. No CT evidence of  active GI hemorrhage. 2. Small hiatal hernia. 3. Colonic diverticulosis without evidence of active diverticulitis. 4. Prostatomegaly. Electronically signed by: Morgane Naveau MD 02/08/2024 03:05 AM EST RP Workstation: [id number]    Microbiology: No results found for this or any previous visit (from the past 240 hours).   Labs: Basic Metabolic Panel: Recent Labs  Lab 02/08/24 0215 02/09/24 0315  NA 141 142  K 3.9 3.9  CL 111 111  CO2 23 25  GLUCOSE 185* 116*  BUN 18 7*  CREATININE 0.99 0.69  CALCIUM  7.5* 8.5*   Liver Function Tests: Recent Labs  Lab 02/08/24 0215  AST 15  ALT 11  ALKPHOS 39  BILITOT 0.2  PROT 5.1*  ALBUMIN 3.2*   Recent Labs  Lab 02/08/24 0215  LIPASE 57*   No results for input(s): AMMONIA in the last 168 hours. CBC: Recent Labs  Lab 02/08/24 0215 02/08/24 0949 02/08/24 1720 02/09/24 0315  WBC 5.6 8.7 8.5 6.6  NEUTROABS 2.9  --   --   --   HGB 10.8* 12.2* 11.1* 11.0*  HCT 33.4* 35.0* 31.7* 31.7*  MCV 97.4 89.3 88.5 89.8  PLT 174 174 166 162   Cardiac Enzymes: No results for input(s): CKTOTAL, CKMB, CKMBINDEX, TROPONINI in the last 168 hours. BNP: BNP (last 3 results) No results for input(s): BNP in the last 8760 hours.  ProBNP (last 3 results) No results  for input(s): PROBNP in the last 8760 hours.  CBG: Recent Labs  Lab 02/08/24 0506  GLUCAP 138*       Signed:  Devaughn KATHEE Ban MD.  [facility] 02/09/2024, 3:38 PM

## 2024-02-09 NOTE — Anesthesia Preprocedure Evaluation (Signed)
 Anesthesia Evaluation  Patient identified by MRN, date of birth, ID band Patient awake    Reviewed: Allergy & Precautions, NPO status , Patient's Chart, lab work & pertinent test results  Airway Mallampati: III  TM Distance: <3 FB Neck ROM: full    Dental  (+) Teeth Intact   Pulmonary neg pulmonary ROS   Pulmonary exam normal breath sounds clear to auscultation       Cardiovascular Exercise Tolerance: Poor hypertension, Pt. on medications + CAD, + Past MI, + Cardiac Stents and +CHF  negative cardio ROS Normal cardiovascular exam Rhythm:Regular Rate:Normal     Neuro/Psych negative neurological ROS  negative psych ROS   GI/Hepatic negative GI ROS, Neg liver ROS,,,  Endo/Other  negative endocrine ROSdiabetes, Type 2, Oral Hypoglycemic Agents  Class 3 obesity  Renal/GU negative Renal ROS  negative genitourinary   Musculoskeletal   Abdominal  (+) + obese  Peds negative pediatric ROS (+)  Hematology negative hematology ROS (+) Blood dyscrasia, anemia   Anesthesia Other Findings Past Medical History: No date: Acute ST elevation myocardial infarction (STEMI) involving  left anterior descending (LAD) coronary artery (HCC)     Comment:  a. 01/2017 Ant STEMI/PCI: LAD 20ost, 100p (3.0x23 Xience              Sierra DES). No date: CAD (coronary artery disease)     Comment:  a. 01/2017 Ant STEMI/PCI: LM 20d, LAD 20ost, 100p               (3.0x23 Xience Sierra DES), 23m, D1 50ost, LCX nl, RCA               30p; b. 11/2017 MV: Intermediate risk w/ 1mm inf ST dep;               c. 11/2017 Cath: LM nl, LAD patent prox stent, D1 20ost,               LCX nl, RCA 30p. EF 45-50%. No date: Chronic anticoagulation     Comment:  DAPT (ASA + clopidogrel ) No date: Chronic combined systolic (congestive) and diastolic  (congestive) heart failure (HCC)     Comment:  a. 01/2018 Echo: EF 30-35%, sev antsept, ant, apical HK.              Gr1  DD, mild MR. No date: Essential hypertension No date: Hyperlipidemia     Comment:  a. Myalgias with high dose lipitor - tolerating 40mg                daily. No date: Ischemic cardiomyopathy     Comment:  a. 01/2018 Echo: EF 30-35%. No date: Type II diabetes mellitus (HCC)  Past Surgical History: No date: APPENDECTOMY 12/18/2020: COLONOSCOPY; N/A     Comment:  Procedure: COLONOSCOPY;  Surgeon: Toledo, Ladell POUR, MD;              Location: [facility] ENDOSCOPY;  Service: Gastroenterology;                Laterality: N/A; 01/25/2017: CORONARY/GRAFT ACUTE MI REVASCULARIZATION; N/A     Comment:  Procedure: Coronary/Graft Acute MI Revascularization;                Surgeon: Mady Bruckner, MD;  Location: [facility] INVASIVE               CV LAB;  Service: Cardiovascular;  Laterality: N/A; 12/18/2020: ESOPHAGOGASTRODUODENOSCOPY; N/A     Comment:  Procedure: ESOPHAGOGASTRODUODENOSCOPY (EGD);  Surgeon:  Toledo, Ladell POUR, MD;  Location: [facility] ENDOSCOPY;                Service: Gastroenterology;  Laterality: N/A; 01/25/2017: LEFT HEART CATH AND CORONARY ANGIOGRAPHY; N/A     Comment:  Procedure: LEFT HEART CATH AND CORONARY ANGIOGRAPHY;                Surgeon: Mady Bruckner, MD;  Location: [facility] INVASIVE               CV LAB;  Service: Cardiovascular;  Laterality: N/A; 11/20/2017: LEFT HEART CATH AND CORONARY ANGIOGRAPHY; N/A     Comment:  Procedure: LEFT HEART CATH AND CORONARY ANGIOGRAPHY;                Surgeon: Darron Deatrice LABOR, MD;  Location: [facility] INVASIVE               CV LAB;  Service: Cardiovascular;  Laterality: N/A; 11/06/2020: XI ROBOTIC ASSISTED INGUINAL HERNIA REPAIR WITH MESH; Right     Comment:  Procedure: XI ROBOTIC ASSISTED INGUINAL HERNIA REPAIR               WITH MESH;  Surgeon: Desiderio Schanz, MD;  Location: [facility]               ORS;  Service: General;  Laterality: Right;  BMI    Body Mass Index: 29.74 kg/m      Reproductive/Obstetrics negative OB ROS                               Anesthesia Physical Anesthesia Plan  ASA: 3  Anesthesia Plan: General   Post-op Pain Management:    Induction:   PONV Risk Score and Plan: Propofol  infusion and TIVA  Airway Management Planned: Natural Airway and Nasal Cannula  Additional Equipment:   Intra-op Plan:   Post-operative Plan:   Informed Consent: I have reviewed the patients History and Physical, chart, labs and discussed the procedure including the risks, benefits and alternatives for the proposed anesthesia with the patient or authorized representative who has indicated his/her understanding and acceptance.     Dental Advisory Given  Plan Discussed with: CRNA  Anesthesia Plan Comments:          Anesthesia Quick Evaluation

## 2024-02-09 NOTE — Progress Notes (Signed)
Pt off the floor for endoscopy.

## 2024-02-10 ENCOUNTER — Other Ambulatory Visit: Payer: Self-pay

## 2024-02-11 ENCOUNTER — Ambulatory Visit: Payer: Self-pay | Admitting: Gastroenterology

## 2024-02-11 LAB — SURGICAL PATHOLOGY

## 2024-02-12 NOTE — Progress Notes (Deleted)
 "  Cardiology Office Note    Date:  02/12/2024   ID:  Lucas [name] 12-19-1955, MRN [id number]  PCP:  Berl Todd BROCKS, MD  Cardiologist:  Lonni Hanson, MD  Electrophysiologist:  None   Chief Complaint: Hospital follow up  History of Present Illness:   Lucas [name] is a 67 y.o. male with history of CAD with late presenting anterior STEMI in 01/2017 status post PCI to the proximal LAD, chronic combined systolic and diastolic CHF, HFimpEF with ICM, borderline diabetes, HTN, and HLD who presents for hospital follow-up as outlined below.   He was admitted to the hospital in 01/2017 with a late presenting anterior STEMI.  He underwent successful PCI/DES to the proximal LAD.  Otherwise, there was mild to moderate nonobstructive disease involving the mid to distal LAD and RCA.  Echo at that time showed an EF of 30 to 35% with severe hypokinesis of the anteroseptal, anterior, and apical myocardium.  There was grade 1 diastolic dysfunction and mild mitral regurgitation.  With medical therapy and PCI, repeat echo in 05/2017 showed an improvement in his LV systolic function with an EF of 40 to 45%, hypokinesis of the anteroseptal and apical myocardium, grade 1 diastolic dysfunction, mild mitral regurgitation, normal RV systolic function, and normal PASP.  He underwent repeat LHC in 11/2017 in the context of an abnormal ETT which demonstrated widely patent LAD stent with no significant restenosis.  There was mild proximal RCA disease that was not significantly different from cardiac cath in 01/2017.  Mildly reduced LV systolic function with an EF of 45% with mid to distal anterior wall hypokinesis.  Medical therapy was recommended.  He has been lost to follow-up in the outpatient setting since he was evaluated in 10/2020 for preoperative cardiac risk stratification for hernia repair.  He was admitted to the hospital in 12/2020 with GI bleed felt to be related to diverticulosis and  gastritis with clopidogrel  temporarily suspended with GI recommended resumption at time of discharge.  During the admission he underwent echo which showed an EF of 35 to 40%, severe hypokinesis of the LV mid apical anterior wall, anteroseptal wall, and apical segment, grade 2 diastolic dysfunction, normal RV systolic function and ventricular cavity size, mildly dilated left atrium, and trivial mitral regurgitation.  He was evaluated by [facility] cardiology in 12/2022 to establish care with no changes in pharmacotherapy pursued at that time.  Echo through [place] [facility] in 12/2022 showed an EF of 40%, diastolic dysfunction, normal RV systolic function, and mild mitral regurgitation.  He was subsequently seen in the ER in 12/2022 with chest and shoulder pain.  High-sensitivity troponin negative x 2.  D-dimer negative.  BNP normal.  EKG with nonspecific changes.  He was discharged from the ER and has been lost to outpatient follow-up since.  He was admitted to the hospital in early 02/2024 with syncope secondary to acute blood loss anemia secondary to hypovolemic hypotension in the setting of GI bleed with diverticulosis.  He required 1 unit PRBC.  Colonoscopy showed diverticulosis which was felt to be the source of bleed.  High-sensitivity troponin was mildly elevated at 375.  EKG without new ischemic changes.  Echo showed an EF of 50 to 55%, hypokinesis of the apical septal wall and apical segment, moderate LVH, grade 1 diastolic dysfunction, normal RV systolic function with mildly enlarged ventricular cavity size, and trivial mitral regurgitation.  He was evaluated by cardiology with recommendation to resume low-dose aspirin  once bleeding is controlled.  ***  Labs independently reviewed: 02/2024 - potassium 3.9, BUN 7, serum creatinine 0.69, Hgb 11.0, PLT 162, A1c 6.0, albumin 3.2, AST/ALT normal 11/2019 - TC 145, TG 215, HDL 30, LDL 79 08/2018 - TSH normal  Past Medical History:  Diagnosis Date    Acute ST elevation myocardial infarction (STEMI) involving left anterior descending (LAD) coronary artery (HCC)    a. 01/2017 Ant STEMI/PCI: LAD 20ost, 100p (3.0x23 Xience Sierra DES).   CAD (coronary artery disease)    a. 01/2017 Ant STEMI/PCI: LM 20d, LAD 20ost, 100p (3.0x23 Xience Sierra DES), 70m, D1 50ost, LCX nl, RCA 30p; b. 11/2017 MV: Intermediate risk w/ 1mm inf ST dep; c. 11/2017 Cath: LM nl, LAD patent prox stent, D1 20ost, LCX nl, RCA 30p. EF 45-50%.   Chronic anticoagulation    DAPT (ASA + clopidogrel )   Chronic combined systolic (congestive) and diastolic (congestive) heart failure (HCC)    a. 01/2018 Echo: EF 30-35%, sev antsept, ant, apical HK. Gr1 DD, mild MR.   Essential hypertension    Hyperlipidemia    a. Myalgias with high dose lipitor - tolerating 40mg  daily.   Ischemic cardiomyopathy    a. 01/2018 Echo: EF 30-35%.   Type II diabetes mellitus (HCC)     Past Surgical History:  Procedure Laterality Date   APPENDECTOMY     COLONOSCOPY N/A 12/18/2020   Procedure: COLONOSCOPY;  Surgeon: Toledo, Ladell POUR, MD;  Location: [facility] ENDOSCOPY;  Service: Gastroenterology;  Laterality: N/A;   COLONOSCOPY N/A 02/09/2024   Procedure: COLONOSCOPY;  Surgeon: Jinny Carmine, MD;  Location: [facility] ENDOSCOPY;  Service: Endoscopy;  Laterality: N/A;   CORONARY/GRAFT ACUTE MI REVASCULARIZATION N/A 01/25/2017   Procedure: Coronary/Graft Acute MI Revascularization;  Surgeon: Mady Bruckner, MD;  Location: [facility] INVASIVE CV LAB;  Service: Cardiovascular;  Laterality: N/A;   ESOPHAGOGASTRODUODENOSCOPY N/A 12/18/2020   Procedure: ESOPHAGOGASTRODUODENOSCOPY (EGD);  Surgeon: Toledo, Ladell POUR, MD;  Location: [facility] ENDOSCOPY;  Service: Gastroenterology;  Laterality: N/A;   LEFT HEART CATH AND CORONARY ANGIOGRAPHY N/A 01/25/2017   Procedure: LEFT HEART CATH AND CORONARY ANGIOGRAPHY;  Surgeon: Mady Bruckner, MD;  Location: [facility] INVASIVE CV LAB;  Service: Cardiovascular;  Laterality: N/A;   LEFT HEART  CATH AND CORONARY ANGIOGRAPHY N/A 11/20/2017   Procedure: LEFT HEART CATH AND CORONARY ANGIOGRAPHY;  Surgeon: Darron Deatrice LABOR, MD;  Location: [facility] INVASIVE CV LAB;  Service: Cardiovascular;  Laterality: N/A;   POLYPECTOMY  02/09/2024   Procedure: POLYPECTOMY, INTESTINE;  Surgeon: Jinny Carmine, MD;  Location: [facility] ENDOSCOPY;  Service: Endoscopy;;   XI ROBOTIC ASSISTED INGUINAL HERNIA REPAIR WITH MESH Right 11/06/2020   Procedure: XI ROBOTIC ASSISTED INGUINAL HERNIA REPAIR WITH MESH;  Surgeon: Desiderio Schanz, MD;  Location: [facility] ORS;  Service: General;  Laterality: Right;    Current Medications: Active Medications[1]  Allergies:   Patient has no known allergies.   Social History   Socioeconomic History   Marital status: Married    Spouse name: Not on file   Number of children: Not on file   Years of education: Not on file   Highest education level: Not on file  Occupational History   Occupation: unemployed  Tobacco Use   Smoking status: Never   Smokeless tobacco: Never  Vaping Use   Vaping status: Never Used  Substance and Sexual Activity   Alcohol use: No   Drug use: No   Sexual activity: Yes    Birth control/protection: Coitus interruptus, None  Other Topics Concern   Not on file  Social History Narrative  Not on file   Social Drivers of Health   Tobacco Use: Low Risk (02/09/2024)   Patient History    Smoking Tobacco Use: Never    Smokeless Tobacco Use: Never    Passive Exposure: Not on file  Financial Resource Strain: Not on file  Food Insecurity: No Food Insecurity (02/08/2024)   Epic    Worried About Programme Researcher, Broadcasting/film/video in the Last Year: Never true    Ran Out of Food in the Last Year: Never true  Transportation Needs: No Transportation Needs (02/08/2024)   Epic    Lack of Transportation (Medical): No    Lack of Transportation (Non-Medical): No  Physical Activity: Not on file  Stress: Not on file  Social Connections: Unknown (02/08/2024)   Social Connection and  Isolation Panel    Frequency of Communication with Friends and Family: More than three times a week    Frequency of Social Gatherings with Friends and Family: More than three times a week    Attends Religious Services: Not on Marketing Executive or Organizations: Not on file    Attends Banker Meetings: Not on file    Marital Status: Not on file  Depression (PHQ2-9): Not on file  Alcohol Screen: Not on file  Housing: Low Risk (02/08/2024)   Epic    Unable to Pay for Housing in the Last Year: No    Number of Times Moved in the Last Year: 0    Homeless in the Last Year: No  Utilities: Not At Risk (02/08/2024)   Epic    Threatened with loss of utilities: No  Health Literacy: Not on file     Family History:  The patient's family history includes Arrhythmia in his father; Asthma in his father and mother; Healthy in his sister; Heart disease in his father.  ROS:   12-point review of systems is negative unless otherwise noted in the HPI.   EKGs/Labs/Other Studies Reviewed:    Studies reviewed were summarized above. The additional studies were reviewed today:  LHC 01/25/2017: Conclusions: Late-presenting anterior STEMI with acute plaque rupture and 100% thrombotic occlusion of the proximal LAD (type 1 MI). Mild to moderate, non-obstructive disease involving the mid/distal LAD and RCA. Moderately to severely reduced left ventricular contraction. Moderately elevated left ventricular filling pressure.   Recommendations: Admit to ICU; patient will need to remain hospitalized at least 48 hours, given large anterior MI. Dual antiplatelet therapy with aspirin  and ticagrelor  for at least 12 months, ideally longer. Aggressive secondary prevention, including high-intensity statin therapy. Obtain transthoracic echocardiogram to better assess LVEF. If LVEF < 35%, I recommend LifeVest on discharge given large anterior MI. Gentle diuresis, given elevated LVEDP. Initiate  carvedilol  3.125 mg BID with uptitration as tolerated. ACEI/ARB +/- aldosterone antagonist should be added prior to discharge based on renal function. __________   2D echo 01/26/2017: - Left ventricle: The cavity size was normal. There was moderate    concentric hypertrophy. Systolic function was moderately to    severely reduced. The estimated ejection fraction was in the    range of 30% to 35%. There is severe hypokinesis of the    anteroseptal, anterior, and apical myocardium. Doppler parameters    are consistent with abnormal left ventricular relaxation (grade 1    diastolic dysfunction).  - Mitral valve: There was mild regurgitation. __________   2D echo 05/29/2017: - Left ventricle: The cavity size was normal. Systolic function was    mildly to  moderately reduced. The estimated ejection fraction was    in the range of 40% to 45%. Hypokinesis of the anterior    myocardium. Hypokinesis of the anteroseptal myocardium.    Hypokinesis of the apical myocardium. Doppler parameters are    consistent with abnormal left ventricular relaxation (grade 1    diastolic dysfunction).  - Mitral valve: There was mild regurgitation.  - Left atrium: The atrium was normal in size.  - Right ventricle: Systolic function was normal.  - Pulmonary arteries: Systolic pressure was within the normal    range. __________   ETT 11/06/2017: Baseline EKG demonstrates normal sinus rhythm with anteroseptal Q waves and non-specific T wave abnormality. Patient demonstrates good exercise capacity with normal heart rate and blood pressure responses. Neck and jaw discomfort noted during peak stress and into early recovery. Horizontal ST segment depression ST segment depression of 1 mm was noted during stress in the II, III and aVF leads. No significant arrhythmia was observed. Intermediate risk study (Duke Treadmill Score = 0).   Abnormal, intermediate risk exercise tolerance test with mild ST depression during  stress and neck/jaw pain, concerning for anginal equivalent. __________   [facility] 11/20/2017: Previously placed Prox LAD drug eluting stent is widely patent. Balloon angioplasty was performed. Ost 1st Diag lesion is 20% stenosed. Prox RCA lesion is 30% stenosed. There is mild left ventricular systolic dysfunction. LV end diastolic pressure is mildly elevated. The left ventricular ejection fraction is 45-50% by visual estimate.   1.  Widely patent LAD stent with no significant restenosis.  Mild proximal RCA disease not different from cardiac catheterization last year. 2.  Mildly reduced LV systolic function with an EF of 45% with mid to distal anterior wall hypokinesis.  Mildly elevated left ventricular end-diastolic pressure.   Recommendations: Continue medical therapy for coronary artery disease and ischemic cardiomyopathy. The patient can be discharged home from a cardiac standpoint. __________  2D echo 12/17/2020: 1. Left ventricular ejection fraction, by estimation, is 35 to 40%. The  left ventricle has moderately decreased function. The left ventricle  demonstrates regional wall motion abnormalities (see scoring  diagram/findings for description). Left ventricular   diastolic parameters are consistent with Grade II diastolic dysfunction  (pseudonormalization). There is severe hypokinesis of the left  ventricular, mid-apical anterior wall, anteroseptal wall and apical  segment.   2. Right ventricular systolic function is normal. The right ventricular  size is normal.   3. Left atrial size was mildly dilated.   4. The mitral valve is grossly normal. Trivial mitral valve  regurgitation. No evidence of mitral stenosis.   5. The aortic valve was not well visualized. Aortic valve regurgitation  is not visualized. No aortic stenosis is present.  __________  2D echo 12/18/2022 [place]): MODERATE LEFT VENTRICULAR SYSTOLIC DYSFUNCTION WITH NO LVH  ESTIMATED EF: 40%  NORMAL LA  PRESSURES WITH DIASTOLIC DYSFUNCTION  NORMAL RIGHT VENTRICULAR SYSTOLIC FUNCTION  VALVULAR REGURGITATION: No AR, MILD MR, No PR, MILD TR  NO VALVULAR STENOSIS  __________  2D echo 02/09/2024: 1. Left ventricular ejection fraction, by estimation, is 50 to 55%. The  left ventricle has low normal function. The left ventricle demonstrates  regional wall motion abnormalities (see scoring diagram/findings for  description). There is moderate left  ventricular hypertrophy. Left ventricular diastolic parameters are  consistent with Grade I diastolic dysfunction (impaired relaxation). There  is severe hypokinesis of the left ventricular, apical septal wall and  apical segment.   2. Right ventricular systolic function is normal. The  right ventricular  size is mildly enlarged.   3. The mitral valve was not well visualized. Trivial mitral valve  regurgitation.   4. The aortic valve is tricuspid. Aortic valve regurgitation is not  visualized. No aortic stenosis is present.    EKG:  EKG is ordered today.  The EKG ordered today demonstrates ***  Recent Labs: 02/08/2024: ALT 11 02/09/2024: BUN 7; Creatinine, Ser 0.69; Hemoglobin 11.0; Platelets 162; Potassium 3.9; Sodium 142  Recent Lipid Panel    Component Value Date/Time   CHOL 145 11/02/2019 1007   TRIG 215 (H) 11/02/2019 1007   HDL 30 (L) 11/02/2019 1007   CHOLHDL 4.8 11/02/2019 1007   CHOLHDL 2.8 05/11/2017 1526   VLDL 23 05/11/2017 1526   LDLCALC 79 11/02/2019 1007   LDLDIRECT 33 02/16/2018 0807    PHYSICAL EXAM:    VS:  There were no vitals taken for this visit.  BMI: There is no height or weight on file to calculate BMI.  Physical Exam Constitutional:      Appearance: He is well-developed.  HENT:     Head: Normocephalic and atraumatic.  Eyes:     General:        Right eye: No discharge.        Left eye: No discharge.  Cardiovascular:     Rate and Rhythm: Normal rate and regular rhythm.     Heart sounds: Normal heart  sounds, S1 normal and S2 normal. Heart sounds not distant. No midsystolic click and no opening snap. No murmur heard.    No friction rub.  Pulmonary:     Effort: Pulmonary effort is normal. No respiratory distress.     Breath sounds: Normal breath sounds. No decreased breath sounds, wheezing, rhonchi or rales.  Musculoskeletal:     Cervical back: Normal range of motion.  Skin:    General: Skin is warm and dry.     Nails: There is no clubbing.  Neurological:     Mental Status: He is alert and oriented to person, place, and time.  Psychiatric:        Speech: Speech normal.        Behavior: Behavior normal.        Thought Content: Thought content normal.        Judgment: Judgment normal.     Wt Readings from Last 3 Encounters:  02/08/24 152 lb 4.8 oz (69.1 kg)  12/27/22 145 lb (65.8 kg)  08/14/21 149 lb 6.4 oz (67.8 kg)     ASSESSMENT & PLAN:   CAD involving the native coronary arteries without angina:  HFimpEF secondary to ICM:  HTN: Blood pressure  HLD: LDL 79 in 2021.   History of recurrent GI bleed with associated symptomatic anemia and hypovolemic hypotension:   {Are you ordering a CV Procedure (e.g. stress test, cath, DCCV, TEE, etc)?   Press F2        :[id number]}     Disposition: F/u with Dr. Mady or an APP in ***.   Medication Adjustments/Labs and Tests Ordered: Current medicines are reviewed at length with the patient today.  Concerns regarding medicines are outlined above. Medication changes, Labs and Tests ordered today are summarized above and listed in the Patient Instructions accessible in Encounters.   Bonney Bernardino Bring, PA-C 02/12/2024 12:30 PM     [facility] [place] [address] [place], [place] [address] [phone]     [1]  No outpatient medications have been marked as taking  for the 02/19/24 encounter (Appointment) with Abigail Bernardino HERO, PA-C.   "

## 2024-02-19 ENCOUNTER — Ambulatory Visit: Admitting: Physician Assistant

## 2024-03-07 ENCOUNTER — Other Ambulatory Visit: Payer: Self-pay

## 2024-03-08 ENCOUNTER — Other Ambulatory Visit: Payer: Self-pay

## 2024-03-08 MED ORDER — ATORVASTATIN CALCIUM 40 MG PO TABS
40.0000 mg | ORAL_TABLET | Freq: Every day | ORAL | 0 refills | Status: AC
  Filled 2024-03-08: qty 30, 30d supply, fill #0
